# Patient Record
Sex: Female | Born: 1960 | Race: White | Hispanic: No | Marital: Married | Smoking: Former smoker
Health system: Southern US, Community
[De-identification: ages and names within clinical notes are randomized; demographics above are authoritative.]

## PROBLEM LIST (undated history)

## (undated) DIAGNOSIS — R5383 Other fatigue: Secondary | ICD-10-CM

## (undated) DIAGNOSIS — G589 Mononeuropathy, unspecified: Secondary | ICD-10-CM

## (undated) DIAGNOSIS — R112 Nausea with vomiting, unspecified: Secondary | ICD-10-CM

## (undated) DIAGNOSIS — I10 Essential (primary) hypertension: Secondary | ICD-10-CM

## (undated) DIAGNOSIS — M1990 Unspecified osteoarthritis, unspecified site: Secondary | ICD-10-CM

## (undated) DIAGNOSIS — Z9889 Other specified postprocedural states: Secondary | ICD-10-CM

## (undated) DIAGNOSIS — R0681 Apnea, not elsewhere classified: Secondary | ICD-10-CM

## (undated) DIAGNOSIS — K219 Gastro-esophageal reflux disease without esophagitis: Secondary | ICD-10-CM

## (undated) DIAGNOSIS — F329 Major depressive disorder, single episode, unspecified: Secondary | ICD-10-CM

## (undated) DIAGNOSIS — M797 Fibromyalgia: Secondary | ICD-10-CM

## (undated) DIAGNOSIS — R0683 Snoring: Secondary | ICD-10-CM

## (undated) DIAGNOSIS — K589 Irritable bowel syndrome without diarrhea: Secondary | ICD-10-CM

## (undated) DIAGNOSIS — M2550 Pain in unspecified joint: Secondary | ICD-10-CM

## (undated) DIAGNOSIS — E119 Type 2 diabetes mellitus without complications: Secondary | ICD-10-CM

## (undated) DIAGNOSIS — M791 Myalgia, unspecified site: Secondary | ICD-10-CM

## (undated) DIAGNOSIS — F32A Depression, unspecified: Secondary | ICD-10-CM

## (undated) HISTORY — DX: Type 2 diabetes mellitus without complications: E11.9

## (undated) HISTORY — PX: CARPAL TUNNEL RELEASE: SHX101

## (undated) HISTORY — PX: ARTHROSCOPIC REPAIR ACL: SUR80

## (undated) HISTORY — DX: Snoring: R06.83

## (undated) HISTORY — DX: Pain in unspecified joint: M25.50

## (undated) HISTORY — DX: Apnea, not elsewhere classified: R06.81

## (undated) HISTORY — DX: Fibromyalgia: M79.7

## (undated) HISTORY — DX: Myalgia, unspecified site: M79.10

## (undated) HISTORY — PX: CHOLECYSTECTOMY: SHX55

## (undated) HISTORY — DX: Other fatigue: R53.83

## (undated) HISTORY — PX: CLUB FOOT RELEASE: SHX1363

## (undated) HISTORY — DX: Irritable bowel syndrome, unspecified: K58.9

---

## 1999-12-09 ENCOUNTER — Ambulatory Visit (HOSPITAL_BASED_OUTPATIENT_CLINIC_OR_DEPARTMENT_OTHER): Admission: RE | Admit: 1999-12-09 | Discharge: 1999-12-09 | Payer: Self-pay | Admitting: Orthopedic Surgery

## 2001-05-24 ENCOUNTER — Encounter (HOSPITAL_COMMUNITY): Admission: RE | Admit: 2001-05-24 | Discharge: 2001-06-23 | Payer: Self-pay | Admitting: Preventative Medicine

## 2007-05-17 ENCOUNTER — Ambulatory Visit (HOSPITAL_COMMUNITY): Admission: RE | Admit: 2007-05-17 | Discharge: 2007-05-17 | Payer: Self-pay | Admitting: Obstetrics and Gynecology

## 2008-02-08 ENCOUNTER — Other Ambulatory Visit: Admission: RE | Admit: 2008-02-08 | Discharge: 2008-02-08 | Payer: Self-pay | Admitting: Obstetrics and Gynecology

## 2008-02-14 ENCOUNTER — Ambulatory Visit (HOSPITAL_COMMUNITY): Admission: RE | Admit: 2008-02-14 | Discharge: 2008-02-14 | Payer: Self-pay | Admitting: Obstetrics & Gynecology

## 2008-06-28 ENCOUNTER — Ambulatory Visit (HOSPITAL_COMMUNITY): Admission: RE | Admit: 2008-06-28 | Discharge: 2008-06-28 | Payer: Self-pay | Admitting: Obstetrics and Gynecology

## 2008-07-16 ENCOUNTER — Ambulatory Visit (HOSPITAL_COMMUNITY): Admission: RE | Admit: 2008-07-16 | Discharge: 2008-07-16 | Payer: Self-pay | Admitting: Internal Medicine

## 2009-03-07 ENCOUNTER — Other Ambulatory Visit: Admission: RE | Admit: 2009-03-07 | Discharge: 2009-03-07 | Payer: Self-pay | Admitting: Obstetrics and Gynecology

## 2009-07-02 ENCOUNTER — Ambulatory Visit (HOSPITAL_COMMUNITY): Admission: RE | Admit: 2009-07-02 | Discharge: 2009-07-02 | Payer: Self-pay | Admitting: Obstetrics and Gynecology

## 2009-10-12 ENCOUNTER — Ambulatory Visit (HOSPITAL_COMMUNITY): Admission: RE | Admit: 2009-10-12 | Discharge: 2009-10-12 | Payer: Self-pay | Admitting: Family Medicine

## 2010-09-04 ENCOUNTER — Other Ambulatory Visit
Admission: RE | Admit: 2010-09-04 | Discharge: 2010-09-04 | Payer: Self-pay | Source: Home / Self Care | Admitting: Obstetrics and Gynecology

## 2011-01-09 NOTE — Op Note (Signed)
[place]. [facility]  Patient:    Erica Price, Erica Price                         MRN: [id number] Proc. Date: 12/09/99 Attending:  Nicki Reaper, M.D. CC:         Nicki Reaper, M.D. (2)                           Operative Report  PREOPERATIVE DIAGNOSIS:  Carpal tunnel syndrome, right hand.  POSTOPERATIVE DIAGNOSIS:  Carpal tunnel syndrome, right hand.  OPERATION:  Decompression of right median nerve.  SURGEON:  Nicki Reaper, M.D.  ASSISTANT:  Joaquin Courts, R.N.  ANESTHESIA:  Forearm-based IV regional.  ANESTHESIOLOGIST:  Edwin Cap. Zoila Shutter, M.D.  INDICATIONS:  The patient is a 50 year old female with a history of carpal tunnel syndrome.  EMG and nerve conductions were positive, which has not responded to conservative treatment.  DESCRIPTION OF PROCEDURE:  The patient was brought to the operating room where  forearm-based IV regional anesthetic was carried out without difficulty.  She was prepped and draped using Betadine scrub and solution, with the ____ arm free, in the supine position.  A longitudinal incision was made in the palm and carried own through the subcutaneous tissue.  Bleeders were electrocauterized. The palmar fascia was split.  The superficial palmar arch was identified.  The flexor tendon to the ring and little finger identified to the ulnar side of the median nerve.  The carpal retinaculum was incised with sharp dissection.  A right angle and Sewall retractor were placed between skin and forearm fascia.  The fascia was released for approximately 3.0 to 5.0 cm, under direct vision.  No further lesions were identified.  The canal was explored.  The wound was irrigated.  The skin was closed with interrupted #5-0 nylon sutures.  A sterile compressive dressing and splint  were applied.  The patient tolerated the procedure well and was taken to the recovery room for  observation, in satisfactory condition.  DISPOSITION:  She is  discharged home, to return to the [facility] in one week, on Vicodin and Keflex. DD:  12/09/99 TD:  12/09/99 Job: [id number]

## 2011-01-26 ENCOUNTER — Other Ambulatory Visit: Payer: Self-pay | Admitting: Obstetrics & Gynecology

## 2011-01-26 DIAGNOSIS — Z139 Encounter for screening, unspecified: Secondary | ICD-10-CM

## 2011-02-05 ENCOUNTER — Ambulatory Visit (HOSPITAL_COMMUNITY)
Admission: RE | Admit: 2011-02-05 | Discharge: 2011-02-05 | Disposition: A | Discharge status: Home / Self Care | Payer: 59 | Source: Ambulatory Visit | Attending: Obstetrics & Gynecology | Admitting: Obstetrics & Gynecology

## 2011-02-05 DIAGNOSIS — Z139 Encounter for screening, unspecified: Secondary | ICD-10-CM

## 2011-02-05 DIAGNOSIS — Z1231 Encounter for screening mammogram for malignant neoplasm of breast: Secondary | ICD-10-CM | POA: Insufficient documentation

## 2013-03-02 ENCOUNTER — Other Ambulatory Visit: Payer: Self-pay | Admitting: Adult Health

## 2013-03-02 DIAGNOSIS — Z139 Encounter for screening, unspecified: Secondary | ICD-10-CM

## 2013-03-07 ENCOUNTER — Ambulatory Visit (HOSPITAL_COMMUNITY)
Admission: RE | Admit: 2013-03-07 | Discharge: 2013-03-07 | Disposition: A | Discharge status: Home / Self Care | Payer: BC Managed Care – PPO | Source: Ambulatory Visit | Attending: Adult Health | Admitting: Adult Health

## 2013-03-07 DIAGNOSIS — Z1231 Encounter for screening mammogram for malignant neoplasm of breast: Secondary | ICD-10-CM | POA: Insufficient documentation

## 2013-03-07 DIAGNOSIS — Z139 Encounter for screening, unspecified: Secondary | ICD-10-CM

## 2013-05-23 ENCOUNTER — Other Ambulatory Visit (HOSPITAL_COMMUNITY): Payer: Self-pay | Admitting: Nephrology

## 2013-05-23 DIAGNOSIS — N289 Disorder of kidney and ureter, unspecified: Secondary | ICD-10-CM

## 2013-05-25 ENCOUNTER — Ambulatory Visit (HOSPITAL_COMMUNITY)
Admission: RE | Admit: 2013-05-25 | Discharge: 2013-05-25 | Disposition: A | Discharge status: Home / Self Care | Payer: BC Managed Care – PPO | Source: Ambulatory Visit | Attending: Nephrology | Admitting: Nephrology

## 2013-05-25 DIAGNOSIS — N289 Disorder of kidney and ureter, unspecified: Secondary | ICD-10-CM | POA: Insufficient documentation

## 2013-05-30 ENCOUNTER — Telehealth: Payer: Self-pay

## 2013-05-30 NOTE — Telephone Encounter (Signed)
Pt was referred by Dr. Phillips Odor for screening colonoscopy. I called and she said she is having some other tests on her kidneys now and she will call when she is ready to schedule. Letter faxed to PCP.

## 2013-07-25 ENCOUNTER — Institutional Professional Consult (permissible substitution): Payer: Self-pay | Admitting: Neurology

## 2013-07-31 ENCOUNTER — Other Ambulatory Visit (HOSPITAL_COMMUNITY): Payer: Self-pay | Admitting: Rheumatology

## 2013-07-31 ENCOUNTER — Ambulatory Visit (HOSPITAL_COMMUNITY)
Admission: RE | Admit: 2013-07-31 | Discharge: 2013-07-31 | Disposition: A | Discharge status: Home / Self Care | Payer: 59 | Source: Ambulatory Visit | Attending: Rheumatology | Admitting: Rheumatology

## 2013-07-31 DIAGNOSIS — M79609 Pain in unspecified limb: Secondary | ICD-10-CM | POA: Insufficient documentation

## 2013-07-31 DIAGNOSIS — M545 Low back pain, unspecified: Secondary | ICD-10-CM | POA: Insufficient documentation

## 2013-07-31 DIAGNOSIS — M79651 Pain in right thigh: Secondary | ICD-10-CM

## 2013-07-31 DIAGNOSIS — M549 Dorsalgia, unspecified: Secondary | ICD-10-CM

## 2013-08-16 ENCOUNTER — Ambulatory Visit (INDEPENDENT_AMBULATORY_CARE_PROVIDER_SITE_OTHER): Payer: 59 | Admitting: Neurology

## 2013-08-16 ENCOUNTER — Encounter: Payer: Self-pay | Admitting: Neurology

## 2013-08-16 VITALS — BP 146/80 | HR 88 | Temp 98.3°F | Resp 16 | Ht 62.0 in | Wt 208.4 lb

## 2013-08-16 DIAGNOSIS — G9519 Other vascular myelopathies: Secondary | ICD-10-CM

## 2013-08-16 DIAGNOSIS — M48062 Spinal stenosis, lumbar region with neurogenic claudication: Secondary | ICD-10-CM

## 2013-08-16 DIAGNOSIS — IMO0001 Reserved for inherently not codable concepts without codable children: Secondary | ICD-10-CM

## 2013-08-16 DIAGNOSIS — M791 Myalgia, unspecified site: Secondary | ICD-10-CM

## 2013-08-16 LAB — CK: Total CK: 82 U/L (ref 7–177)

## 2013-08-16 NOTE — Patient Instructions (Signed)
1.  Check blood work today 2.  EMG of the left side 3.  Return to clinic in 25-month

## 2013-08-16 NOTE — Progress Notes (Signed)
[facility] Neurology Division Clinic Note - Initial Visit   Date: 08/16/2013    FLORIS Price MRN: [id number] DOB: 11-05-1960   Dear Dr Phillips Odor:  Thank you for your kind referral of Erica Price for consultation of generalized body aches. Although her history is well known to you, please allow [name] to reiterate it for the purpose of our medical record. The patient was accompanied to the clinic by daughter-in-law.   History of Present Illness: Erica Price is a 52 y.o. right-handed Caucasian female with history of myalgias and arthritis presenting for evaluation of generalized body pain and aches.    Around September 2014, she started developing weakness of her legs, worse on the right side.  She also started having joint pain of her elbows (L >R).  She is unable to lift anything heavy with her left arm, because of elbow pain.  She has not had any falls. Symptoms seem to be intermittent and worse with activity (sweeping, mopping, walking).  Rest and tramadol improves symptoms.  She also reports having muscle cramps for the past year, but this has improved after she stopped taking naprosyn.  She feels more comfortable if walking with a shopping cart.  She complains of blurred vision but attritutes this to cataracts. She has very rare spells of difficulty swallowing solids.  Denies double vision, changes in voice, easy fatiguability.  She reports feeling very stiff in the morning and it takes a few hours for her to get "limber".  Around 8pm, her stiffness returns.  She saw Dr. Kellie Simmering, Rheumatology, who performed plain films and was told she has moderate arthritis L5-S1.    Out-side paper records, electronic medical record, and images have been reviewed where available and summarized as:  Lumbar XR 07/31/2013:  Facet overgrowth on the right L5-S1. No significant disc degeneration. No acute bony abnormality.  Labs:  ANA, ESR 6, CCP, RF - neg   Past Medical History  Diagnosis  Date  . Fatigue   . Myalgia   . Arthralgia   . Snoring   . Apnea     Poss OSA    Past Surgical History  Procedure Laterality Date  . Club foot release Left   . Arthroscopic repair acl    . Cholecystectomy    . Carpal tunnel release Right      Medications:  Current Outpatient Prescriptions on File Prior to Visit  Medication Sig Dispense Refill  . Calcium Carbonate-Vitamin D (CALCIUM-VITAMIN D) 500-200 MG-UNIT per tablet Take 1 tablet by mouth daily.      . furosemide (LASIX) 20 MG tablet Take 20 mg by mouth.      . traMADol-acetaminophen (ULTRACET) 37.5-325 MG per tablet Take 1 tablet by mouth every 6 (six) hours as needed.       No current facility-administered medications on file prior to visit.    Allergies: No Known Allergies  Family History: Family History  Problem Relation Age of Onset  . Heart failure Father     Died, 13  . Hypertension Mother     Living, 29  . Breast cancer Mother   . Kidney cancer Mother   . Hypercholesterolemia Son     Social History: History   Social History  . Marital Status: Married    Spouse Name: N/A    Number of Children: N/A  . Years of Education: N/A   Occupational History  . Not on file.   Social History Main Topics  . Smoking status: Former Smoker  Quit date: 10/18/2011  . Smokeless tobacco: Not on file  . Alcohol Use: .5 - 1.5 oz/week    1-3 drink(s) per week     Comment: Rare, only holidays  . Drug Use: No  . Sexual Activity: Not on file   Other Topics Concern  . Not on file   Social History Narrative   She is currently unemployed.  She was terminated on 06/15/2013.  She was previously a Architect, worked at [employer], and worked in Designer, fashion/clothing for 25 years.   She lives with husband.  They have two children.    Review of Systems:  CONSTITUTIONAL: No fevers, chills, night sweats, or weight loss.   EYES: No visual changes or eye pain ENT: No hearing changes.  No history of nose bleeds.   RESPIRATORY:  No cough, wheezing and shortness of breath.   CARDIOVASCULAR: Negative for chest pain, and palpitations.   GI: Negative for abdominal discomfort, blood in stools or black stools.  No recent change in bowel habits.   GU:  No history of incontinence.   MUSCLOSKELETAL: No history of joint pain or swelling.  +myalgias.   SKIN: Negative for lesions, rash, and itching.   HEMATOLOGY/ONCOLOGY: Negative for prolonged bleeding, bruising easily, and swollen nodes.   ENDOCRINE: Negative for cold or heat intolerance, polydipsia or goiter.   PSYCH:  +depression or anxiety symptoms.   NEURO: As Above.   Vital Signs:  BP 146/80  Pulse 88  Temp(Src) 98.3 F (36.8 C)  Resp 16  Ht 5\' 2"  (1.575 m)  Wt 208 lb 6.4 oz (94.53 kg)  BMI 38.11 kg/m2   General Medical Exam:   General:  Obese, well appearing, comfortable.   Eyes/ENT: see cranial nerve examination.   Neck: No masses appreciated.  Full range of motion without tenderness.  No carotid bruits. Respiratory:  Clear to auscultation, good air entry bilaterally.   Cardiac:  Regular rate and rhythm, no murmur.   Back:  No pain to palpation of spinous processes.   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 Skin:  Skin color, texture, turgor normal. No rashes or lesions. Positive for fibromyalgia tenderpoints at 14/18 areas  Neurological Exam: MENTAL STATUS including orientation to time, place, person, recent and remote memory, attention span and concentration, language, and fund of knowledge is normal.  Speech is not dysarthric.  CRANIAL NERVES: II:  No visual field defects.  Unremarkable fundi.   III-IV-VI: Pupils equal round and reactive to light.  Normal conjugate, extra-ocular eye movements in all directions of gaze.  No nystagmus.  No ptosis with sustained upward gaze.   V:  Normal facial sensation.   VII:  Normal facial symmetry and movements. VIII:  Normal hearing and vestibular function.   IX-X:  Normal  palatal movement.   XI:  Normal shoulder shrug and head rotation.   XII:  Normal tongue strength and range of motion, no deviation or fasciculation.  MOTOR:  No atrophy, fasciculations or abnormal movements.  No pronator drift.  Tone is normal.  No fatigability on exam.  Right Upper Extremity:    Left Upper Extremity:    Deltoid  5/5   Deltoid  5/5   Biceps  5/5   Biceps  5/5   Triceps  5/5   Triceps  5/5   Wrist extensors  5/5   Wrist extensors  5/5   Wrist flexors  5/5   Wrist flexors  5/5   Finger extensors  5/5   Finger extensors  5/5   Finger flexors  5/5   Finger flexors  5/5   Dorsal interossei  5/5   Dorsal interossei  5/5   Abductor pollicis  5/5   Abductor pollicis  5/5   Tone (Ashworth scale)  0  Tone (Ashworth scale)  0   Right Lower Extremity:    Left Lower Extremity:    Hip flexors  5/5   Hip flexors  5/5   Hip extensors  5/5   Hip extensors  5/5   Knee flexors  5/5   Knee flexors  5/5   Knee extensors  5/5   Knee extensors  5/5   Dorsiflexors  5/5   Dorsiflexors  5/5   Plantarflexors  5/5   Plantarflexors  5/5   Toe extensors  5/5   Toe extensors  5/5   Toe flexors  5/5   Toe flexors  5/5   Tone (Ashworth scale)  0  Tone (Ashworth scale)  0   MSRs:  Right                                                                 Left brachioradialis 2+  brachioradialis 2+  biceps 2+  biceps 2+  triceps 2+  triceps 2+  patellar 2+  patellar 2+  ankle jerk 2+  ankle jerk 2+  Hoffman no  Hoffman no  plantar response down  plantar response down   SENSORY:  Normal and symmetric perception of light touch, pinprick, vibration, and proprioception.  Romberg's sign absent.   COORDINATION/GAIT: Normal finger-to- nose-finger and heel-to-shin.  Intact rapid alternating movements bilaterally.  Able to rise from a chair without using arms.  Gait narrow based and stable. Tandem and stressed gait intact.     IMPRESSION: Ms. Kaser is a 52 year-old female presenting for evaluation  of mylagias and arthralgias.  Neurological examination is non-focal.  Motor strength and bulk is preserved.  Normal reflexes.  She does have tenderness to 14/18 fibromylagia tenderpoints. I think that she may have an overlay of symptoms related to arthritis, possible fibromyalgia, and neurogenic claudication.  I will complete work-up as noted below to help differentiate these and see which is most problematic.Her leg symptoms are suggestive of neurogenic claudication, so will obtain EMG of the legs. My suspicion for myopathy or neuromuscular junction disorder is low based on her history or exam.    PLAN/RECOMMENDATIONS:  1.  Check CK, aldolase, vitamin B12, SPEP with IFE 2.  EMG of the left side 3.  Return to clinic in 7-month   The duration of this appointment visit was 60 minutes of face-to-face time with the patient.  Greater than 50% of this time was spent in counseling, explanation of diagnosis, planning of further management, and coordination of care.   Thank you for allowing me to participate in patient's care.  If I can answer any additional questions, I would be pleased to do so.    Sincerely,    Donika K. Allena Katz, DO

## 2013-08-19 LAB — ALDOLASE: Aldolase: 6.2 U/L (ref <=8.1)

## 2013-08-21 LAB — PROTEIN ELECTROPHORESIS, SERUM
Beta Globulin: 7.7 % — ABNORMAL HIGH (ref 4.7–7.2)
Gamma Globulin: 11.5 % (ref 11.1–18.8)

## 2013-09-18 ENCOUNTER — Ambulatory Visit (INDEPENDENT_AMBULATORY_CARE_PROVIDER_SITE_OTHER): Payer: 59 | Admitting: Neurology

## 2013-09-18 ENCOUNTER — Encounter: Payer: Self-pay | Admitting: Neurology

## 2013-09-18 DIAGNOSIS — M5417 Radiculopathy, lumbosacral region: Secondary | ICD-10-CM

## 2013-09-18 DIAGNOSIS — IMO0002 Reserved for concepts with insufficient information to code with codable children: Secondary | ICD-10-CM | POA: Diagnosis not present

## 2013-09-18 NOTE — Progress Notes (Signed)
See procedure note for EMG results.  Donika K. Patel, DO  

## 2013-09-18 NOTE — Procedures (Signed)
[facility] Neurology  [place], [address]  [place], [place] [address] Tel: [phone] Fax:  [phone] Test Date:  09/18/2013  Patient: Erica Price DOB: 03-30-1961 Physician: Narda Amber, DO  Sex: Female Height: 5\' 2"  Ref Phys: Narda Amber  ID#: [id number] Temp: 34.2C Technician:    Patient Complaints: This is a 53 year-old female presenting for evaluation of bilateral leg pain and weakness.  NCV & EMG Findings: Extensive evaluation of the right lower extremity and additional studies of the left reveals:  1. Normal sural and superficial peroneal sensory responses.  2. Normal tibial and peroneal motor responses. 3. H-reflex is normal bilaterally.  4. Needle electrode examination shows chronic motor axonal loss changes affecting the tibialis anterior and gluteus medius muscles bilaterally. There is no active denervation.   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 Overall, these findings are mild in degree electrically.  There is no evidence of a sensorimotor polyneuropathy affecting the right lower extremity.   ___________________________ Narda Amber, DO    Nerve Conduction Studies Anti Sensory Summary Table   Site NR Peak (ms) Norm Peak (ms) P-T Amp (V) Norm P-T Amp  Right Sup Peroneal Anti Sensory (Ant Lat Mall)  12 cm    2.4 <4.6 14.1 >4  Right Sural Anti Sensory (Lat Mall)  Calf    2.5 <4.6 19.1 >4   Motor Summary Table   Site NR Onset (ms) Norm Onset (ms) O-P Amp (mV) Norm O-P Amp Site1 Site2 Delta-0 (ms) Dist (cm) Vel (m/s) Norm Vel (m/s)  Right Peroneal Motor (Ext Dig Brev)  Ankle    3.3 <6.0 3.8 >2.5 B Fib Ankle 5.9 31.0 53 >40  B Fib    9.2  3.7  Poplt B Fib 2.0 10.0 50 >40  Poplt    11.2  3.5         Right Tibial Motor (Abd Hall Brev)  Ankle    3.8 <6.0 4.0 >4 Knee Ankle 7.8 41.0 53 >40  Knee    11.6  2.1          H Reflex Studies   NR H-Lat (ms) Lat Norm  (ms) L-R H-Lat (ms)  Left Tibial (Gastroc)     30.67 <35 0.89  Right Tibial (Gastroc)     31.56 <35 0.89   EMG   Side Muscle Ins Act Fibs Psw Fasc Number Recrt Dur Dur. Amp Amp. Poly Poly. Comment  Right GluteusMed Nml Nml Nml Nml 1- Mod-R Few 1+ Nml Nml Few 1- N/A  Right AntTibialis Nml Nml Nml Nml 1- Mod-R Some 1+ Nml Nml Some 1+ N/A  Right Flex Dig Long Nml Nml Nml Nml Nml Nml Nml Nml Nml Nml Nml Nml N/A  Right RectFemoris Nml Nml Nml Nml Nml Nml Nml Nml Nml Nml Nml Nml N/A  Right BicepsFemS Nml Nml Nml Nml Nml Nml Nml Nml Nml Nml Nml Nml N/A  Right Gastroc Nml Nml Nml Nml Nml Nml Nml Nml Nml Nml Nml Nml N/A  Left AntTibialis Nml Nml Nml Nml 1- Mod-R Some 1+ Nml Nml Few 1- N/A  Left GluteusMed Nml Nml Nml Nml 1- Mod-R Few 1+ Nml Nml Few 1- N/A  Left Gastroc Nml Nml Nml Nml Nml Nml Nml Nml Nml Nml Nml Nml N/A      Waveforms:

## 2013-09-18 NOTE — Procedures (Signed)
[facility] Neurology  [place], [place]  [place],  [address] Tel: [phone] Fax:  [phone] Test Date:  09/18/2013  Patient: Erica Price DOB: 02/10/1936 Physician: Narda Amber, DO  Sex: Female Height: 5\' 2"  Ref Phys: Metta Clines  ID#: [id number] Temp: 32.1C Technician:    Patient Complaints: This is a 53 year-old female presenting for evaluation of dysarthria and bilateral foot drop.  NCV & EMG Findings: Extensive evaluation of the right upper extremity, right lower extremity, mid-thoracic paraspinal levels (T7 and T11 level) and bulbar muscles reveals:  1. Normal median and ulnar sensory responses. The sural and superficial peroneal sensory responses are absent.   2. The tibial motor response is markedly reduced. The peroneal motor response recorded at extensor digitorum brevis and tibialis anterior is essentially absent. 3. Normal median and ulnar motor responses. 4. Chronic motor axonal loss changes affecting nearly all the tested muscles of the lower extremity with active changes in the tibialis anterior, gastrocnemius, and vastus lateralis.  No motor units were recruited in the flexor digitorum longus. 5. In the upper extremity, there is chronic motor axonal loss changes affecting the C6-T1 myotomes, with sparse active changes isolated to the first dorsal interosseous muscle. 6. There is no active motor axon loss involving the thoracic paraspinal muscles. 7. Chronic motor axon loss changes are seen in the hypoglossal and mentalis muscles without evidence of active denervation.   Impression: 1. Active on chronic generalized large fiber sensorimotor polyneuropathy, axon loss in type, affecting the right side conforming to a gradient pattern. Overall, these changes are moderately severe in degree electrically.  2. Alternatively, multilevel intraspinal canal lesions (i.e. radiculopathy, anterior horn cell disorder) affecting C5-T1 and L2-S1 nerve roots/segments  cannot be excluded and repeat EMG in 3-6 months may be indicated to evaluate to degree of ongoing axon motor loss.    ___________________________ Narda Amber, DO    Nerve Conduction Studies Anti Sensory Summary Table   Site NR Peak (ms) Norm Peak (ms) P-T Amp (V) Norm P-T Amp  Right Median Anti Sensory (2nd Digit)  Wrist    3.3 <3.8 16.0 >10  Right Sup Peroneal Anti Sensory (Ant Lat Mall)  12 cm NR  <4.6  >3  Right Sural Anti Sensory (Lat Mall)  Calf NR  <4.6  >3  Right Ulnar Anti Sensory (5th Digit)  Wrist    3.2 <3.2 15.4 >5   Motor Summary Table   Site NR Onset (ms) Norm Onset (ms) O-P Amp (mV) Norm O-P Amp Site1 Site2 Delta-0 (ms) Dist (cm) Vel (m/s) Norm Vel (m/s)  Right Median Motor (Abd Poll Brev)  Wrist    3.4 <4.0 7.4 >5 Elbow Wrist 5.1 27.0 53 >50  Elbow    8.5  6.4         Right Peroneal Motor (Ext Dig Brev)  Ankle NR  <6.0  >2.5 B Fib Ankle  33.0  >40  B Fib NR     Poplt B Fib  10.0  >40  Poplt NR            Right Peroneal TA Motor (Tib Ant)  Fib Head    6.3 <4.5 0.6 >3 Poplit Fib Head 18.5 10.0 5 >40  Poplit    24.8  0.6         Post-exercise    3.3  0.2         Right Tibial Motor (Abd Hall Brev)  Ankle    11.4 <6.0 0.8 >4 Knee  Ankle  41.0  >40  Knee NR            Right Ulnar Motor (Abd Dig Minimi)  Wrist    2.4 <3.1 7.9 >7 B Elbow Wrist 3.7 21.0 57 >50  B Elbow    6.1  7.3  A Elbow B Elbow 1.9 10.0 53 >50  A Elbow    8.0  7.2          EMG   Side Muscle Ins Act Fibs Psw Fasc Number Recrt Dur Dur. Amp Amp. Poly Poly. Comment  Right Gastroc Nml 1+ Nml Nml 3- Rapid All 1+ All 1+ Nml Nml N/A  Right AntTibialis Nml 2+ Nml Nml SMU Rapid All 1+ All 1+ Nml Nml N/A  Right Flex Dig Long Nml Nml Nml Nml NE - - - - - - - N/A  Right VastusLat Nml 1+ Nml Nml 3- Rapid Many 1+ Most 1+ Few 1+ N/A  Right 1stDorInt Nml Nml 1+ Nml 1- Rapid Few 1+ Few 1+ Few 1+ N/A  Right Ext Indicis Nml Nml Nml Nml Nml Nml Nml Nml Nml Nml Nml Nml N/A  Right FlexPolLong Nml Nml Nml Nml  1- Mod-R Few 1+ Few 1+ Nml Nml N/A  Right ABD Dig Min Nml Nml Nml Nml 1- Mod-R Few 1+ Nml Nml Nml Nml N/A  Right Abd Poll Brev Nml Nml Nml Nml 2- Rapid Some 1+ Some 1+ Nml Nml N/A  Right PronatorTeres Nml Nml Nml Nml 1- Mod-R Few 1+ Nml Nml Nml Nml N/A  Right Biceps Nml Nml Nml 1+ 1- Mod-R Few 1- Nml Nml Few 1+ N/A  Right Triceps Nml Nml Nml Nml 1- Mod-R Some Nml Nml Nml Nml Nml N/A  Right Deltoid Nml Nml Nml Nml 1- Rapid Some 1+ Some 1+ Nml Nml N/A  Right Cervical Parasp Low Nml Nml Nml Nml Nml Nml Nml Nml Nml Nml Nml Nml N/A  Right T7 Parasp Nml Nml Nml Nml Nml Nml Nml Nml Nml Nml Nml Nml N/A  Right GluteusMed Nml Nml Nml Nml 1- Mod-R Some 1+ Nml Nml Nml Nml N/A  Right BicepsFemS Nml Nml Nml Nml 1- Mod-R Some 1+ Some 1+ Nml Nml N/A  Right Lumbo Parasp Low Nml Nml Nml Nml Nml Nml Nml Nml Nml Nml Nml Nml N/A  Right T11 Parasp Nml Nml Nml Nml Nml Nml Nml Nml Nml Nml Nml Nml N/A  Right Mentalis Nml Nml Nml Nml 1- Mod-R Few 1+ Nml Nml Nml Nml N/A  Right Hypoglossal Nml Nml Nml Nml 1- Mod-R Few 1+ Nml Nml Nml Nml N/A      Waveforms:

## 2014-04-04 ENCOUNTER — Other Ambulatory Visit: Payer: Self-pay | Admitting: Adult Health

## 2014-04-04 DIAGNOSIS — Z139 Encounter for screening, unspecified: Secondary | ICD-10-CM

## 2014-04-09 ENCOUNTER — Ambulatory Visit (HOSPITAL_COMMUNITY)
Admission: RE | Admit: 2014-04-09 | Discharge: 2014-04-09 | Disposition: A | Discharge status: Home / Self Care | Payer: 59 | Source: Ambulatory Visit | Attending: Adult Health | Admitting: Adult Health

## 2014-04-09 DIAGNOSIS — Z1231 Encounter for screening mammogram for malignant neoplasm of breast: Secondary | ICD-10-CM | POA: Diagnosis not present

## 2014-04-09 DIAGNOSIS — Z139 Encounter for screening, unspecified: Secondary | ICD-10-CM

## 2014-04-25 ENCOUNTER — Telehealth: Payer: Self-pay

## 2014-04-25 NOTE — Telephone Encounter (Signed)
Pt called to schedule her first colonoscopy. She can be reached at [phone]

## 2014-05-02 NOTE — Telephone Encounter (Signed)
I called pt. She is having constipation now. She has intermittent diarrhea and constipation. I explained to her that we really need to make sure she is NOT having any constipation issues prior to colonoscopy.  She is scheduled OV with Laban Emperor, NP on 06/01/2014 at 8:30 AM.  She will get Dr. Hilma Favors to fax new referral since her referral was sent 04/2013.

## 2014-06-01 ENCOUNTER — Encounter: Payer: Self-pay | Admitting: Gastroenterology

## 2014-06-01 ENCOUNTER — Ambulatory Visit (INDEPENDENT_AMBULATORY_CARE_PROVIDER_SITE_OTHER): Payer: 59 | Admitting: Gastroenterology

## 2014-06-01 ENCOUNTER — Other Ambulatory Visit: Payer: Self-pay

## 2014-06-01 ENCOUNTER — Encounter (HOSPITAL_COMMUNITY): Payer: Self-pay | Admitting: Pharmacy Technician

## 2014-06-01 VITALS — BP 126/78 | HR 74 | Temp 97.7°F | Ht 62.0 in | Wt 218.2 lb

## 2014-06-01 DIAGNOSIS — Z1211 Encounter for screening for malignant neoplasm of colon: Secondary | ICD-10-CM

## 2014-06-01 DIAGNOSIS — K219 Gastro-esophageal reflux disease without esophagitis: Secondary | ICD-10-CM

## 2014-06-01 MED ORDER — PANTOPRAZOLE SODIUM 40 MG PO TBEC: 40.0000 mg | DELAYED_RELEASE_TABLET | Freq: Every day | ORAL | Status: DC

## 2014-06-01 MED ORDER — PEG-KCL-NACL-NASULF-NA ASC-C 100 G PO SOLR: 1.0000 | ORAL | Status: DC

## 2014-06-01 MED ORDER — DICYCLOMINE HCL 10 MG PO CAPS: 10.0000 mg | ORAL_CAPSULE | Freq: Three times a day (TID) | ORAL | Status: DC

## 2014-06-01 NOTE — Progress Notes (Signed)
Primary Care Physician:  Purvis Kilts, MD Primary Gastroenterologist:  Dr. Oneida Alar   Chief Complaint  Patient presents with  . Colonoscopy    HPI:   Erica Price presents today for a screening colonoscopy. Intermittent constipation, diarrhea. Feel like it's about equal. Symptoms for about 2 years. Previously would have just sporadic diarrhea. No relation of diarrhea to certain foods. No rectal bleeding. Abdominal cramping with diarrhea. Since starting Tramadol has noticed constipation. Episodes will last a day to 2 days. Eats cheese but not a lot of other dairy. Zantac 75 mg po BID, now chewing antacids along with it due to bloating. Decreased heartburn. Sometimes feels like "hard to swallow". Occasionally, not every time she eats.   Past Medical History  Diagnosis Date  . Fatigue   . Myalgia   . Arthralgia   . Snoring   . Apnea     Poss OSA  . Fibromyalgia     Past Surgical History  Procedure Laterality Date  . Club foot release Left   . Arthroscopic repair acl    . Cholecystectomy  1990s  . Carpal tunnel release Right     Current Outpatient Prescriptions  Medication Sig Dispense Refill  . Cinnamon 500 MG capsule Take 500 mg by mouth daily.      . DULoxetine (CYMBALTA) 60 MG capsule Take 60 mg by mouth daily.      . furosemide (LASIX) 20 MG tablet Take 20 mg by mouth.      . gabapentin (NEURONTIN) 100 MG capsule Take 100 mg by mouth 2 (two) times daily.       Marland Kitchen Ketorolac Tromethamine (TORADOL IJ) Inject as directed every 6 (six) weeks.      . ranitidine (ZANTAC) 75 MG tablet Take 75 mg by mouth 2 (two) times daily.      . simethicone (MYLICON) 80 MG chewable tablet Chew 80 mg by mouth once.      . traMADol (ULTRAM) 50 MG tablet Take 50 mg by mouth every 6 (six) hours as needed.       . zolpidem (AMBIEN) 10 MG tablet Take 10 mg by mouth at bedtime.        No current facility-administered medications for this visit.    Allergies as of 06/01/2014  . (No  Known Allergies)    Family History  Problem Relation Age of Onset  . Heart failure Father     Died, 78  . Hypertension Mother     Living, 73  . Breast cancer Mother   . Kidney cancer Mother   . Hypercholesterolemia Son   . Colon cancer Neg Hx     History   Social History  . Marital Status: Married    Spouse Name: N/A    Number of Children: N/A  . Years of Education: N/A   Occupational History  . Not on file.   Social History Main Topics  . Smoking status: Former Smoker    Quit date: 10/18/2011  . Smokeless tobacco: Not on file  . Alcohol Use: 0.5 - 1.5 oz/week    1-3 drink(s) per week     Comment: Rare, only holidays  . Drug Use: No  . Sexual Activity: Not on file   Other Topics Concern  . Not on file   Social History Narrative   She is currently unemployed.  She was terminated on 06/15/2013.  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   She lives with husband.  They have two children.    Review of Systems: Gen: Denies any fever, chills, fatigue, weight loss, lack of appetite.  CV: Denies chest pain, heart palpitations, peripheral edema, syncope.  Resp: +DOE GI: see HPI GU : Denies urinary burning, urinary frequency, urinary hesitancy MS: +fibromyalgia Derm: Denies rash, itching, dry skin Psych: Denies depression, anxiety, memory loss, and confusion Heme: Denies bruising, bleeding, and enlarged lymph nodes.  Physical Exam: BP 126/78  Pulse 74  Temp(Src) 97.7 F (36.5 C) (Oral)  Ht 5\' 2"  (1.575 m)  Wt 218 lb 3.2 oz (98.975 kg)  BMI 39.90 kg/m2 General:   Alert and oriented. Pleasant and cooperative. Well-nourished and well-developed.  Head:  Normocephalic and atraumatic. Eyes:  Without icterus, sclera clear and conjunctiva pink.  Ears:  Normal auditory acuity. Nose:  No deformity, discharge,  or lesions. Mouth:  No deformity or lesions, oral mucosa pink.  Lungs:  Clear to auscultation bilaterally.  No wheezes, rales, or rhonchi. No distress.  Heart:  S1, S2 present without murmurs appreciated.  Abdomen:  +BS, soft, non-tender and non-distended. No HSM noted. No guarding or rebound. No masses appreciated.  Rectal:  Deferred  Msk:  Symmetrical without gross deformities. Normal posture. Extremities:  Without clubbing or edema. Neurologic:  Alert and  oriented x4;  grossly normal neurologically. Skin:  Intact without significant lesions or rashes. Psych:  Alert and cooperative. Normal mood and affect.

## 2014-06-01 NOTE — Patient Instructions (Addendum)
On days you have constipation, you may take Miralax each evening as needed.  On days of loose stool, you may take Bentyl 1 capsule with meals up to 4 times that day. Monitor for constipation, dry mouth, dizziness.   Review the high fiber diet attached.   We are setting you up for a colonoscopy with a possible upper endoscopy and dilation if no improvement with Protonix.   High-Fiber Diet Fiber is found in fruits, vegetables, and grains. A high-fiber diet encourages the addition of more whole grains, legumes, fruits, and vegetables in your diet. The recommended amount of fiber for adult males is 38 g per day. For adult females, it is 25 g per day. Pregnant and lactating women should get 28 g of fiber per day. If you have a digestive or bowel problem, ask your caregiver for advice before adding high-fiber foods to your diet. Eat a variety of high-fiber foods instead of only a select few type of foods.  PURPOSE  To increase stool bulk.  To make bowel movements more regular to prevent constipation.  To lower cholesterol.  To prevent overeating. WHEN IS THIS DIET USED?  It may be used if you have constipation and hemorrhoids.  It may be used if you have uncomplicated diverticulosis (intestine condition) and irritable bowel syndrome.  It may be used if you need help with weight management.  It may be used if you want to add it to your diet as a protective measure against atherosclerosis, diabetes, and cancer. SOURCES OF FIBER  Whole-grain breads and cereals.  Fruits, such as apples, oranges, bananas, berries, prunes, and pears.  Vegetables, such as green peas, carrots, sweet potatoes, beets, broccoli, cabbage, spinach, and artichokes.  Legumes, such split peas, soy, lentils.  Almonds. FIBER CONTENT IN FOODS Starches and Grains / Dietary Fiber (g)  Cheerios, 1 cup / 3 g  Corn Flakes cereal, 1 cup / 0.7 g  Rice crispy treat cereal, 1 cup / 0.3 g  Instant oatmeal (cooked),   cup / 2 g  Frosted wheat cereal, 1 cup / 5.1 g  Brown, long-grain rice (cooked), 1 cup / 3.5 g  White, long-grain rice (cooked), 1 cup / 0.6 g  Enriched macaroni (cooked), 1 cup / 2.5 g Legumes / Dietary Fiber (g)  Baked beans (canned, plain, or vegetarian),  cup / 5.2 g  Kidney beans (canned),  cup / 6.8 g  Pinto beans (cooked),  cup / 5.5 g Breads and Crackers / Dietary Fiber (g)  Plain or honey graham crackers, 2 squares / 0.7 g  Saltine crackers, 3 squares / 0.3 g  Plain, salted pretzels, 10 pieces / 1.8 g  Whole-wheat bread, 1 slice / 1.9 g  White bread, 1 slice / 0.7 g  Raisin bread, 1 slice / 1.2 g  Plain bagel, 3 oz / 2 g  Flour tortilla, 1 oz / 0.9 g  Corn tortilla, 1 small / 1.5 g  Hamburger or hotdog bun, 1 small / 0.9 g Fruits / Dietary Fiber (g)  Apple with skin, 1 medium / 4.4 g  Sweetened applesauce,  cup / 1.5 g  Banana,  medium / 1.5 g  Grapes, 10 grapes / 0.4 g  Orange, 1 small / 2.3 g  Raisin, 1.5 oz / 1.6 g  Melon, 1 cup / 1.4 g Vegetables / Dietary Fiber (g)  Green beans (canned),  cup / 1.3 g  Carrots (cooked),  cup / 2.3 g  Broccoli (cooked),  cup /  2.8 g  Peas (cooked),  cup / 4.4 g  Mashed potatoes,  cup / 1.6 g  Lettuce, 1 cup / 0.5 g  Corn (canned),  cup / 1.6 g  Tomato,  cup / 1.1 g Document Released: 08/10/2005 Document Revised: 02/09/2012 Document Reviewed: 11/12/2011 [facility] Patient Information 2015 [place], [place]. This information is not intended to replace advice given to you by your health care provider. Make sure you discuss any questions you have with your health care provider.

## 2014-06-04 NOTE — Assessment & Plan Note (Signed)
53 year old female with need for initial screening. Chronic history of intermittent loose stools and constipation, likely IBS. No rectal bleeding.   Proceed with colonoscopy with Dr. Oneida Alar in the near future. The risks, benefits, and alternatives have been discussed in detail with the patient. They state understanding and desire to proceed.  Miralax prn constipation Bentyl for diarrhea High fiber diet

## 2014-06-04 NOTE — Assessment & Plan Note (Signed)
Zantac BID along with Tums. No PPI currently. Occasional dysphagia. Start Protonix now. If no improvement, needs EGD with dilation at time of colonoscopy. Discussed possibility of needing upper endoscopy at time of colonoscopy if no improvement. States understanding.

## 2014-06-05 NOTE — Progress Notes (Signed)
cc'ed to pcp °

## 2014-06-06 NOTE — Patient Instructions (Signed)
LEMYA GREENWELL  06/06/2014   Your procedure is scheduled on:  06/12/2014  Report to [facility] at  48  AM.  Call this number if you have problems the morning of surgery: [phone]   Remember:   Do not eat food or drink liquids after midnight.   Take these medicines the morning of surgery with A SIP OF WATER:  Cymbalta, gabapentin, protonix, ultram   Do not wear jewelry, make-up or nail polish.  Do not wear lotions, powders, or perfumes.   Do not shave 48 hours prior to surgery. Men may shave face and neck.  Do not bring valuables to the hospital.  [facility] is not responsible for any belongings or valuables.               Contacts, dentures or bridgework may not be worn into surgery.  Leave suitcase in the car. After surgery it may be brought to your room.  For patients admitted to the hospital, discharge time is determined by your treatment team.               Patients discharged the day of surgery will not be allowed to drive home.  Name and phone number of your driver: family  Special Instructions: Shower using CHG 2 nights before surgery and the night before surgery.  If you shower the day of surgery use CHG.  Use special wash - you have one bottle of CHG for all showers.  You should use approximately 1/3 of the bottle for each shower.   Please read over the following fact sheets that you were given: Pain Booklet, Coughing and Deep Breathing, Surgical Site Infection Prevention, Anesthesia Post-op Instructions and Care and Recovery After Surgery Esophagogastroduodenoscopy Esophagogastroduodenoscopy (EGD) is a procedure to examine the lining of the esophagus, stomach, and first part of the small intestine (duodenum). A long, flexible, lighted tube with a camera attached (endoscope) is inserted down the throat to view these organs. This procedure is done to detect problems or abnormalities, such as inflammation, bleeding, ulcers, or growths, in order to treat them. The  procedure lasts about 5-20 minutes. It is usually an outpatient procedure, but it may need to be performed in emergency cases in the hospital. LET YOUR CAREGIVER KNOW ABOUT:   Allergies to food or medicine.  All medicines you are taking, including vitamins, herbs, eyedrops, and over-the-counter medicines and creams.  Use of steroids (by mouth or creams).  Previous problems you or members of your family have had with the use of anesthetics.  Any blood disorders you have.  Previous surgeries you have had.  Other health problems you have.  Possibility of pregnancy, if this applies. RISKS AND COMPLICATIONS  Generally, EGD is a safe procedure. However, as with any procedure, complications can occur. Possible complications include:  Infection.  Bleeding.  Tearing (perforation) of the esophagus, stomach, or duodenum.  Difficulty breathing or not being able to breath.  Excessive sweating.  Spasms of the larynx.  Slowed heartbeat.  Low blood pressure. BEFORE THE PROCEDURE  Do not eat or drink anything for 6-8 hours before the procedure or as directed by your caregiver.  Ask your caregiver about changing or stopping your regular medicines.  If you wear dentures, be prepared to remove them before the procedure.  Arrange for someone to drive you home after the procedure. PROCEDURE   A vein will be accessed to give medicines and fluids. A medicine to relax you (sedative)  and a pain reliever will be given through that access into the vein.  A numbing medicine (local anesthetic) may be sprayed on your throat for comfort and to stop you from gagging or coughing.  A mouth guard may be placed in your mouth to protect your teeth and to keep you from biting on the endoscope.  You will be asked to lie on your left side.  The endoscope is inserted down your throat and into the esophagus, stomach, and duodenum.  Air is put through the endoscope to allow your caregiver to view the  lining of your esophagus clearly.  The esophagus, stomach, and duodenum is then examined. During the exam, your caregiver may:  Remove tissue to be examined under a microscope (biopsy) for inflammation, infection, or other medical problems.  Remove growths.  Remove objects (foreign bodies) that are stuck.  Treat any bleeding with medicines or other devices that stop tissues from bleeding (hot cautery, clipping devices).  Widen (dilate) or stretch narrowed areas of the esophagus and stomach.  The endoscope will then be withdrawn. AFTER THE PROCEDURE  You will be taken to a recovery area to be monitored. You will be able to go home once you are stable and alert.  Do not eat or drink anything until the local anesthetic and numbing medicines have worn off. You may choke.  It is normal to feel bloated, have pain with swallowing, or have a sore throat for a short time. This will wear off.  Your caregiver should be able to discuss his or her findings with you. It will take longer to discuss the test results if any biopsies were taken. Document Released: 12/11/2004 Document Revised: 12/25/2013 Document Reviewed: 07/13/2012 [facility] Patient Information 2015 [place], [place]. This information is not intended to replace advice given to you by your health care provider. Make sure you discuss any questions you have with your health care provider. Colonoscopy A colonoscopy is an exam to look at the entire large intestine (colon). This exam can help find problems such as tumors, polyps, inflammation, and areas of bleeding. The exam takes about 1 hour.  LET [facility] CARE PROVIDER KNOW ABOUT:   Any allergies you have.  All medicines you are taking, including vitamins, herbs, eye drops, creams, and over-the-counter medicines.  Previous problems you or members of your family have had with the use of anesthetics.  Any blood disorders you have.  Previous surgeries you have had.  Medical  conditions you have. RISKS AND COMPLICATIONS  Generally, this is a safe procedure. However, as with any procedure, complications can occur. Possible complications include:  Bleeding.  Tearing or rupture of the colon wall.  Reaction to medicines given during the exam.  Infection (rare). BEFORE THE PROCEDURE   Ask your health care provider about changing or stopping your regular medicines.  You may be prescribed an oral bowel prep. This involves drinking a large amount of medicated liquid, starting the day before your procedure. The liquid will cause you to have multiple loose stools until your stool is almost clear or light green. This cleans out your colon in preparation for the procedure.  Do not eat or drink anything else once you have started the bowel prep, unless your health care provider tells you it is safe to do so.  Arrange for someone to drive you home after the procedure. PROCEDURE   You will be given medicine to help you relax (sedative).  You will lie on your side with your knees  bent.  A long, flexible tube with a light and camera on the end (colonoscope) will be inserted through the rectum and into the colon. The camera sends video back to a computer screen as it moves through the colon. The colonoscope also releases carbon dioxide gas to inflate the colon. This helps your health care provider see the area better.  During the exam, your health care provider may take a small tissue sample (biopsy) to be examined under a microscope if any abnormalities are found.  The exam is finished when the entire colon has been viewed. AFTER THE PROCEDURE   Do not drive for 24 hours after the exam.  You may have a small amount of blood in your stool.  You may pass moderate amounts of gas and have mild abdominal cramping or bloating. This is caused by the gas used to inflate your colon during the exam.  Ask when your test results will be ready and how you will get your results.  Make sure you get your test results. Document Released: 08/07/2000 Document Revised: 05/31/2013 Document Reviewed: 04/17/2013 [facility] Patient Information 2015 [place], [place]. This information is not intended to replace advice given to you by your health care provider. Make sure you discuss any questions you have with your health care provider. PATIENT INSTRUCTIONS POST-ANESTHESIA  IMMEDIATELY FOLLOWING SURGERY:  Do not drive or operate machinery for the first twenty four hours after surgery.  Do not make any important decisions for twenty four hours after surgery or while taking narcotic pain medications or sedatives.  If you develop intractable nausea and vomiting or a severe headache please notify your doctor immediately.  FOLLOW-UP:  Please make an appointment with your surgeon as instructed. You do not need to follow up with anesthesia unless specifically instructed to do so.  WOUND CARE INSTRUCTIONS (if applicable):  Keep a dry clean dressing on the anesthesia/puncture wound site if there is drainage.  Once the wound has quit draining you may leave it open to air.  Generally you should leave the bandage intact for twenty four hours unless there is drainage.  If the epidural site drains for more than 36-48 hours please call the anesthesia department.  QUESTIONS?:  Please feel free to call your physician or the hospital operator if you have any questions, and they will be happy to assist you.

## 2014-06-07 ENCOUNTER — Encounter (HOSPITAL_COMMUNITY)
Admission: RE | Admit: 2014-06-07 | Discharge: 2014-06-07 | Disposition: A | Discharge status: Home / Self Care | Payer: 59 | Source: Ambulatory Visit | Attending: Gastroenterology | Admitting: Gastroenterology

## 2014-06-07 ENCOUNTER — Encounter (HOSPITAL_COMMUNITY): Payer: Self-pay

## 2014-06-07 DIAGNOSIS — K219 Gastro-esophageal reflux disease without esophagitis: Secondary | ICD-10-CM | POA: Insufficient documentation

## 2014-06-07 DIAGNOSIS — Z0000 Encounter for general adult medical examination without abnormal findings: Secondary | ICD-10-CM | POA: Insufficient documentation

## 2014-06-07 DIAGNOSIS — R0683 Snoring: Secondary | ICD-10-CM | POA: Diagnosis not present

## 2014-06-07 DIAGNOSIS — M797 Fibromyalgia: Secondary | ICD-10-CM | POA: Diagnosis not present

## 2014-06-07 DIAGNOSIS — M2550 Pain in unspecified joint: Secondary | ICD-10-CM | POA: Diagnosis not present

## 2014-06-07 DIAGNOSIS — F329 Major depressive disorder, single episode, unspecified: Secondary | ICD-10-CM | POA: Diagnosis not present

## 2014-06-07 DIAGNOSIS — M1990 Unspecified osteoarthritis, unspecified site: Secondary | ICD-10-CM | POA: Diagnosis not present

## 2014-06-07 DIAGNOSIS — R5383 Other fatigue: Secondary | ICD-10-CM | POA: Diagnosis not present

## 2014-06-07 DIAGNOSIS — M791 Myalgia: Secondary | ICD-10-CM | POA: Insufficient documentation

## 2014-06-07 HISTORY — DX: Nausea with vomiting, unspecified: R11.2

## 2014-06-07 HISTORY — DX: Gastro-esophageal reflux disease without esophagitis: K21.9

## 2014-06-07 HISTORY — DX: Mononeuropathy, unspecified: G58.9

## 2014-06-07 HISTORY — DX: Depression, unspecified: F32.A

## 2014-06-07 HISTORY — DX: Unspecified osteoarthritis, unspecified site: M19.90

## 2014-06-07 HISTORY — DX: Major depressive disorder, single episode, unspecified: F32.9

## 2014-06-07 HISTORY — DX: Other specified postprocedural states: Z98.890

## 2014-06-07 LAB — HEMOGLOBIN AND HEMATOCRIT, BLOOD
HCT: 42.3 % (ref 36.0–46.0)
HEMOGLOBIN: 14.2 g/dL (ref 12.0–15.0)

## 2014-06-07 LAB — BASIC METABOLIC PANEL
Anion gap: 13 (ref 5–15)
BUN: 18 mg/dL (ref 6–23)
CALCIUM: 9.4 mg/dL (ref 8.4–10.5)
CO2: 27 mEq/L (ref 19–32)
Chloride: 100 mEq/L (ref 96–112)
Creatinine, Ser: 0.97 mg/dL (ref 0.50–1.10)
GFR calc Af Amer: 77 mL/min — ABNORMAL LOW (ref >90)
GFR calc non Af Amer: 66 mL/min — ABNORMAL LOW (ref >90)
GLUCOSE: 145 mg/dL — AB (ref 70–99)
Potassium: 5.1 mEq/L (ref 3.7–5.3)
SODIUM: 140 meq/L (ref 137–147)

## 2014-06-07 NOTE — Pre-Procedure Instructions (Signed)
Patient given information to sign up for my chart at home. 

## 2014-06-12 ENCOUNTER — Encounter (HOSPITAL_COMMUNITY): Payer: Commercial Managed Care - PPO | Admitting: Anesthesiology

## 2014-06-12 ENCOUNTER — Ambulatory Visit (HOSPITAL_COMMUNITY)
Admission: RE | Admit: 2014-06-12 | Discharge: 2014-06-12 | Disposition: A | Discharge status: Home / Self Care | Payer: Commercial Managed Care - PPO | Source: Ambulatory Visit | Attending: Gastroenterology | Admitting: Gastroenterology

## 2014-06-12 ENCOUNTER — Encounter (HOSPITAL_COMMUNITY): Payer: Self-pay | Admitting: *Deleted

## 2014-06-12 ENCOUNTER — Encounter (HOSPITAL_COMMUNITY)
Admission: RE | Disposition: A | Discharge status: Home / Self Care | Payer: Self-pay | Source: Ambulatory Visit | Attending: Gastroenterology

## 2014-06-12 ENCOUNTER — Ambulatory Visit (HOSPITAL_COMMUNITY): Payer: Commercial Managed Care - PPO | Admitting: Anesthesiology

## 2014-06-12 DIAGNOSIS — K2980 Duodenitis without bleeding: Secondary | ICD-10-CM | POA: Insufficient documentation

## 2014-06-12 DIAGNOSIS — D128 Benign neoplasm of rectum: Secondary | ICD-10-CM | POA: Insufficient documentation

## 2014-06-12 DIAGNOSIS — Z87891 Personal history of nicotine dependence: Secondary | ICD-10-CM | POA: Insufficient documentation

## 2014-06-12 DIAGNOSIS — F329 Major depressive disorder, single episode, unspecified: Secondary | ICD-10-CM | POA: Diagnosis not present

## 2014-06-12 DIAGNOSIS — D125 Benign neoplasm of sigmoid colon: Secondary | ICD-10-CM | POA: Diagnosis not present

## 2014-06-12 DIAGNOSIS — R0681 Apnea, not elsewhere classified: Secondary | ICD-10-CM | POA: Diagnosis not present

## 2014-06-12 DIAGNOSIS — K219 Gastro-esophageal reflux disease without esophagitis: Secondary | ICD-10-CM | POA: Diagnosis not present

## 2014-06-12 DIAGNOSIS — Z79899 Other long term (current) drug therapy: Secondary | ICD-10-CM | POA: Diagnosis not present

## 2014-06-12 DIAGNOSIS — K648 Other hemorrhoids: Secondary | ICD-10-CM | POA: Diagnosis not present

## 2014-06-12 DIAGNOSIS — K644 Residual hemorrhoidal skin tags: Secondary | ICD-10-CM | POA: Diagnosis not present

## 2014-06-12 DIAGNOSIS — Z1211 Encounter for screening for malignant neoplasm of colon: Secondary | ICD-10-CM | POA: Diagnosis not present

## 2014-06-12 DIAGNOSIS — K5730 Diverticulosis of large intestine without perforation or abscess without bleeding: Secondary | ICD-10-CM | POA: Diagnosis not present

## 2014-06-12 DIAGNOSIS — R1310 Dysphagia, unspecified: Secondary | ICD-10-CM

## 2014-06-12 DIAGNOSIS — K222 Esophageal obstruction: Secondary | ICD-10-CM | POA: Insufficient documentation

## 2014-06-12 DIAGNOSIS — K2900 Acute gastritis without bleeding: Secondary | ICD-10-CM | POA: Insufficient documentation

## 2014-06-12 DIAGNOSIS — K621 Rectal polyp: Secondary | ICD-10-CM

## 2014-06-12 DIAGNOSIS — D124 Benign neoplasm of descending colon: Secondary | ICD-10-CM

## 2014-06-12 HISTORY — PX: COLONOSCOPY WITH PROPOFOL: SHX5780

## 2014-06-12 HISTORY — PX: POLYPECTOMY: SHX5525

## 2014-06-12 HISTORY — PX: BIOPSY: SHX5522

## 2014-06-12 HISTORY — PX: ESOPHAGOGASTRODUODENOSCOPY (EGD) WITH PROPOFOL: SHX5813

## 2014-06-12 HISTORY — PX: SAVORY DILATION: SHX5439

## 2014-06-12 SURGERY — COLONOSCOPY WITH PROPOFOL
Anesthesia: Monitor Anesthesia Care

## 2014-06-12 MED ORDER — PROPOFOL 10 MG/ML IV EMUL
INTRAVENOUS | Status: AC
  Filled 2014-06-12: qty 20

## 2014-06-12 MED ORDER — GLYCOPYRROLATE 0.2 MG/ML IJ SOLN
INTRAMUSCULAR | Status: AC
  Filled 2014-06-12: qty 1

## 2014-06-12 MED ORDER — WATER FOR IRRIGATION, STERILE IR SOLN
Status: DC | PRN
  Administered 2014-06-12: 1000 mL via SURGICAL_CAVITY

## 2014-06-12 MED ORDER — ONDANSETRON HCL 4 MG/2ML IJ SOLN: 4.0000 mg | Freq: Once | INTRAMUSCULAR | Status: DC | PRN

## 2014-06-12 MED ORDER — ONDANSETRON HCL 4 MG/2ML IJ SOLN
INTRAMUSCULAR | Status: AC
  Administered 2014-06-12: 4 mg
  Filled 2014-06-12: qty 2

## 2014-06-12 MED ORDER — FENTANYL CITRATE 0.05 MG/ML IJ SOLN
INTRAMUSCULAR | Status: AC
  Filled 2014-06-12: qty 2

## 2014-06-12 MED ORDER — MIDAZOLAM HCL 2 MG/2ML IJ SOLN
1.0000 mg | INTRAMUSCULAR | Status: DC | PRN
  Administered 2014-06-12 (×2): 2 mg via INTRAVENOUS

## 2014-06-12 MED ORDER — FENTANYL CITRATE 0.05 MG/ML IJ SOLN
25.0000 ug | INTRAMUSCULAR | Status: AC
  Administered 2014-06-12 (×2): 25 ug via INTRAVENOUS

## 2014-06-12 MED ORDER — LIDOCAINE VISCOUS 2 % MT SOLN
OROMUCOSAL | Status: AC
  Filled 2014-06-12: qty 15

## 2014-06-12 MED ORDER — LACTATED RINGERS IV SOLN
INTRAVENOUS | Status: DC
  Administered 2014-06-12: 1000 mL via INTRAVENOUS

## 2014-06-12 MED ORDER — MIDAZOLAM HCL 2 MG/2ML IJ SOLN
INTRAMUSCULAR | Status: AC
  Filled 2014-06-12: qty 2

## 2014-06-12 MED ORDER — FENTANYL CITRATE 0.05 MG/ML IJ SOLN
INTRAMUSCULAR | Status: DC | PRN
  Administered 2014-06-12: 25 ug via INTRAVENOUS

## 2014-06-12 MED ORDER — ONDANSETRON HCL 4 MG/2ML IJ SOLN: 4.0000 mg | Freq: Once | INTRAMUSCULAR | Status: DC

## 2014-06-12 MED ORDER — FENTANYL CITRATE 0.05 MG/ML IJ SOLN: 25.0000 ug | INTRAMUSCULAR | Status: DC | PRN

## 2014-06-12 MED ORDER — LIDOCAINE VISCOUS 2 % MT SOLN
7.0000 mL | OROMUCOSAL | Status: AC
  Administered 2014-06-12 (×2): 7 mL via OROMUCOSAL
  Filled 2014-06-12: qty 10

## 2014-06-12 MED ORDER — PROPOFOL INFUSION 10 MG/ML OPTIME
INTRAVENOUS | Status: DC | PRN
  Administered 2014-06-12: 120 ug/kg/min via INTRAVENOUS
  Administered 2014-06-12 (×2): via INTRAVENOUS

## 2014-06-12 MED ORDER — GLYCOPYRROLATE 0.2 MG/ML IJ SOLN
0.2000 mg | Freq: Once | INTRAMUSCULAR | Status: AC
  Administered 2014-06-12: 0.2 mg via INTRAVENOUS

## 2014-06-12 MED ORDER — PROPOFOL 10 MG/ML IV BOLUS
INTRAVENOUS | Status: DC | PRN
  Administered 2014-06-12 (×4): 10 mg via INTRAVENOUS

## 2014-06-12 MED ORDER — MINERAL OIL PO OIL
TOPICAL_OIL | ORAL | Status: AC
  Filled 2014-06-12: qty 30

## 2014-06-12 SURGICAL SUPPLY — 26 items
BLOCK BITE 60FR ADLT L/F BLUE (MISCELLANEOUS) ×2 IMPLANT
ELECT REM PT RETURN 9FT ADLT (ELECTROSURGICAL) ×2
ELECTRODE REM PT RTRN 9FT ADLT (ELECTROSURGICAL) ×1 IMPLANT
FCP BXJMBJMB 240X2.8X (CUTTING FORCEPS)
FLOOR PAD 36X40 (MISCELLANEOUS) ×2
FORCEPS BIOP RAD 4 LRG CAP 4 (CUTTING FORCEPS) ×2 IMPLANT
FORCEPS BIOP RJ4 240 W/NDL (CUTTING FORCEPS)
FORCEPS BXJMBJMB 240X2.8X (CUTTING FORCEPS) IMPLANT
FORMALIN 10 PREFIL 20ML (MISCELLANEOUS) IMPLANT
INJECTOR/SNARE I SNARE (MISCELLANEOUS) IMPLANT
KIT CLEAN ENDO COMPLIANCE (KITS) ×2 IMPLANT
LUBRICANT JELLY 4.5OZ STERILE (MISCELLANEOUS) ×2 IMPLANT
MANIFOLD NEPTUNE II (INSTRUMENTS) ×2 IMPLANT
NEEDLE SCLEROTHERAPY 25GX240 (NEEDLE) IMPLANT
PAD FLOOR 36X40 (MISCELLANEOUS) ×1 IMPLANT
PROBE APC STR FIRE (PROBE) IMPLANT
PROBE INJECTION GOLD (MISCELLANEOUS)
PROBE INJECTION GOLD 7FR (MISCELLANEOUS) IMPLANT
SNARE ROTATE MED OVAL 20MM (MISCELLANEOUS) ×2 IMPLANT
SNARE SHORT THROW 13M SML OVAL (MISCELLANEOUS) ×2 IMPLANT
SYR 50ML LL SCALE MARK (SYRINGE) ×2 IMPLANT
SYR INFLATION 60ML (SYRINGE) IMPLANT
TRAP SPECIMEN MUCOUS 40CC (MISCELLANEOUS) IMPLANT
TUBING ENDO SMARTCAP PENTAX (MISCELLANEOUS) ×2 IMPLANT
TUBING IRRIGATION ENDOGATOR (MISCELLANEOUS) ×2 IMPLANT
WATER STERILE IRR 1000ML POUR (IV SOLUTION) ×2 IMPLANT

## 2014-06-12 NOTE — Discharge Instructions (Signed)
You had 5 polyps removed. YOU HAVE diverticulosis in your left and right colon, SMALL INTERNAL HEMORRHOIDS, AND MODERATE EXTERNAL HEMORRHOIDS. YOU HAVE MODERATE GASTRITIS AND MILD DUODENITIS. I STRETCHED YOUR ESOPHAGUS DUE to A STRICTURE. I BIOPSIED YOUR STOMACH and small bowel.     CONTINUE YOUR WEIGHT LOSS EFFORTS. LOSE 10 LBS. YOUR BODY MASS INDEX IS 40 WHICH MEANS YOU ARE mobidly OBESE. OBESITY IS ASSOCIATED WITH AN INCREASE FOR ALL CANCERS, INCLUDING COLON CANCER. A body mass index over 40 shortens your expected life span 10 yrs.  FOLLOW A HIGH FIBER/LOW FAT DIET. AVOID ITEMS THAT CAUSE BLOATING. SEE INFO BELOW.  CONTINUE PROTONIX once daily 30 MINUTES PRIOR TO BREAKFAST.  USE BENTYL TO PREVENT SEVERE CRAMPS AND DIARRHEA. IT CAN CAUSE CONSTIPATION.  YOUR BIOPSY WILL BE BACK IN 14 DAYS.  Follow up in 4 mos.  Next colonoscopy in 3-5 years.    ENDOSCOPY Care After Read the instructions outlined below and refer to this sheet in the next week. These discharge instructions provide you with general information on caring for yourself after you leave the hospital. While your treatment has been planned according to the most current medical practices available, unavoidable complications occasionally occur. If you have any problems or questions after discharge, call DR. Jesslyn Viglione, [phone].  ACTIVITY  You may resume your regular activity, but move at a slower pace for the next 24 hours.   Take frequent rest periods for the next 24 hours.   Walking will help get rid of the air and reduce the bloated feeling in your belly (abdomen).   No driving for 24 hours (because of the medicine (anesthesia) used during the test).   You may shower.   Do not sign any important legal documents or operate any machinery for 24 hours (because of the anesthesia used during the test).    NUTRITION  Drink plenty of fluids.   You may resume your normal diet as instructed by your doctor.   Begin with a  light meal and progress to your normal diet. Heavy or fried foods are harder to digest and may make you feel sick to your stomach (nauseated).   Avoid alcoholic beverages for 24 hours or as instructed.    MEDICATIONS  You may resume your normal medications.   WHAT YOU CAN EXPECT TODAY  Some feelings of bloating in the abdomen.   Passage of more gas than usual.   Spotting of blood in your stool or on the toilet paper  .  IF YOU HAD POLYPS REMOVED DURING THE ENDOSCOPY:  Eat a soft diet IF YOU HAVE NAUSEA, BLOATING, ABDOMINAL PAIN, OR VOMITING.    FINDING OUT THE RESULTS OF YOUR TEST Not all test results are available during your visit. DR. Oneida Alar WILL CALL YOU WITHIN 14 DAYS OF YOUR PROCEDUE WITH YOUR RESULTS. Do not assume everything is normal if you have not heard from DR. Derwin Reddy IN ONE WEEK, CALL HER OFFICE AT [phone].  SEEK IMMEDIATE MEDICAL ATTENTION AND CALL THE OFFICE: [phone] IF:  You have more than a spotting of blood in your stool.   Your belly is swollen (abdominal distention).   You are nauseated or vomiting.   You have a temperature over 101F.   You have abdominal pain or discomfort that is severe or gets worse throughout the day.   Low-Fat Diet BREADS, CEREALS, PASTA, RICE, DRIED PEAS, AND BEANS These products are high in carbohydrates and most are low in fat. Therefore, they can be increased in the  diet as substitutes for fatty foods. They too, however, contain calories and should not be eaten in excess. Cereals can be eaten for snacks as well as for breakfast.   FRUITS AND VEGETABLES It is good to eat fruits and vegetables. Besides being sources of fiber, both are rich in vitamins and some minerals. They help you get the daily allowances of these nutrients. Fruits and vegetables can be used for snacks and desserts.  MEATS Limit lean meat, chicken, [place], and fish to no more than 6 ounces per day. Beef, Pork, and Lamb Use lean cuts of beef,  pork, and lamb. Lean cuts include:  Extra-lean ground beef.  Arm roast.  Sirloin tip.  Center-cut ham.  Round steak.  Loin chops.  Rump roast.  Tenderloin.  Trim all fat off the outside of meats before cooking. It is not necessary to severely decrease the intake of red meat, but lean choices should be made. Lean meat is rich in protein and contains a highly absorbable form of iron. Premenopausal women, in particular, should avoid reducing lean red meat because this could increase the risk for low red blood cells (iron-deficiency anemia).  Chicken and [place] These are good sources of protein. The fat of poultry can be reduced by removing the skin and underlying fat layers before cooking. Chicken and [place] can be substituted for lean red meat in the diet. Poultry should not be fried or covered with high-fat sauces. Fish and Shellfish Fish is a good source of protein. Shellfish contain cholesterol, but they usually are low in saturated fatty acids. The preparation of fish is important. Like chicken and [place], they should not be fried or covered with high-fat sauces. EGGS Egg whites contain no fat or cholesterol. They can be eaten often. Try 1 to 2 egg whites instead of whole eggs in recipes or use egg substitutes that do not contain yolk. MILK AND DAIRY PRODUCTS Use skim or 1% milk instead of 2% or whole milk. Decrease whole milk, natural, and processed cheeses. Use nonfat or low-fat (2%) cottage cheese or low-fat cheeses made from vegetable oils. Choose nonfat or low-fat (1 to 2%) yogurt. Experiment with evaporated skim milk in recipes that call for heavy cream. Substitute low-fat yogurt or low-fat cottage cheese for sour cream in dips and salad dressings. Have at least 2 servings of low-fat dairy products, such as 2 glasses of skim (or 1%) milk each day to help get your daily calcium intake. FATS AND OILS Reduce the total intake of fats, especially saturated fat. Butterfat, lard, and beef fats  are high in saturated fat and cholesterol. These should be avoided as much as possible. Vegetable fats do not contain cholesterol, but certain vegetable fats, such as coconut oil, palm oil, and palm kernel oil are very high in saturated fats. These should be limited. These fats are often used in bakery goods, processed foods, popcorn, oils, and nondairy creamers. Vegetable shortenings and some peanut butters contain hydrogenated oils, which are also saturated fats. Read the labels on these foods and check for saturated vegetable oils. Unsaturated vegetable oils and fats do not raise blood cholesterol. However, they should be limited because they are fats and are high in calories. Total fat should still be limited to 30% of your daily caloric intake. Desirable liquid vegetable oils are corn oil, cottonseed oil, olive oil, canola oil, safflower oil, soybean oil, and sunflower oil. Peanut oil is not as good, but small amounts are acceptable. Buy a heart-healthy tub margarine that  has no partially hydrogenated oils in the ingredients. Mayonnaise and salad dressings often are made from unsaturated fats, but they should also be limited because of their high calorie and fat content. Seeds, nuts, peanut butter, olives, and avocados are high in fat, but the fat is mainly the unsaturated type. These foods should be limited mainly to avoid excess calories and fat. OTHER EATING TIPS Snacks  Most sweets should be limited as snacks. They tend to be rich in calories and fats, and their caloric content outweighs their nutritional value. Some good choices in snacks are graham crackers, melba toast, soda crackers, bagels (no egg), English muffins, fruits, and vegetables. These snacks are preferable to snack crackers, [place] fries, TORTILLA CHIPS, and POTATO chips. Popcorn should be air-popped or cooked in small amounts of liquid vegetable oil. Desserts Eat fruit, low-fat yogurt, and fruit ices instead of pastries, cake, and  cookies. Sherbet, angel food cake, gelatin dessert, frozen low-fat yogurt, or other frozen products that do not contain saturated fat (pure fruit juice bars, frozen ice pops) are also acceptable.  COOKING METHODS Choose those methods that use little or no fat. They include: Poaching.  Braising.  Steaming.  Grilling.  Baking.  Stir-frying.  Broiling.  Microwaving.  Foods can be cooked in a nonstick pan without added fat, or use a nonfat cooking spray in regular cookware. Limit fried foods and avoid frying in saturated fat. Add moisture to lean meats by using water, broth, cooking wines, and other nonfat or low-fat sauces along with the cooking methods mentioned above. Soups and stews should be chilled after cooking. The fat that forms on top after a few hours in the refrigerator should be skimmed off. When preparing meals, avoid using excess salt. Salt can contribute to raising blood pressure in some people.  EATING AWAY FROM HOME Order entres, potatoes, and vegetables without sauces or butter. When meat exceeds the size of a deck of cards (3 to 4 ounces), the rest can be taken home for another meal. Choose vegetable or fruit salads and ask for low-calorie salad dressings to be served on the side. Use dressings sparingly. Limit high-fat toppings, such as bacon, crumbled eggs, cheese, sunflower seeds, and olives. Ask for heart-healthy tub margarine instead of butter.  High-Fiber Diet A high-fiber diet changes your normal diet to include more whole grains, legumes, fruits, and vegetables. Changes in the diet involve replacing refined carbohydrates with unrefined foods. The calorie level of the diet is essentially unchanged. The Dietary Reference Intake (recommended amount) for adult males is 38 grams per day. For adult females, it is 25 grams per day. Pregnant and lactating women should consume 28 grams of fiber per day. Fiber is the intact part of a plant that is not broken down during digestion.  Functional fiber is fiber that has been isolated from the plant to provide a beneficial effect in the body. PURPOSE  Increase stool bulk.   Ease and regulate bowel movements.   Lower cholesterol.  INDICATIONS THAT YOU NEED MORE FIBER  Constipation and hemorrhoids.   Uncomplicated diverticulosis (intestine condition) and irritable bowel syndrome.   Weight management.   As a protective measure against hardening of the arteries (atherosclerosis), diabetes, and cancer.   GUIDELINES FOR INCREASING FIBER IN THE DIET  Start adding fiber to the diet slowly. A gradual increase of about 5 more grams (2 slices of whole-wheat bread, 2 servings of most fruits or vegetables, or 1 bowl of high-fiber cereal) per day is best. Too rapid  an increase in fiber may result in constipation, flatulence, and bloating.   Drink enough water and fluids to keep your urine clear or pale yellow. Water, juice, or caffeine-free drinks are recommended. Not drinking enough fluid may cause constipation.   Eat a variety of high-fiber foods rather than one type of fiber.   Try to increase your intake of fiber through using high-fiber foods rather than fiber pills or supplements that contain small amounts of fiber.   The goal is to change the types of food eaten. Do not supplement your present diet with high-fiber foods, but replace foods in your present diet.  INCLUDE A VARIETY OF FIBER SOURCES  Replace refined and processed grains with whole grains, canned fruits with fresh fruits, and incorporate other fiber sources. White rice, white breads, and most bakery goods contain little or no fiber.   Brown whole-grain rice, buckwheat oats, and many fruits and vegetables are all good sources of fiber. These include: broccoli, Brussels sprouts, cabbage, cauliflower, beets, sweet potatoes, white potatoes (skin on), carrots, tomatoes, eggplant, squash, berries, fresh fruits, and dried fruits.   Cereals appear to be the richest  source of fiber. Cereal fiber is found in whole grains and bran. Bran is the fiber-rich outer coat of cereal grain, which is largely removed in refining. In whole-grain cereals, the bran remains. In breakfast cereals, the largest amount of fiber is found in those with "bran" in their names. The fiber content is sometimes indicated on the label.   You may need to include additional fruits and vegetables each day.   In baking, for 1 cup white flour, you may use the following substitutions:   1 cup whole-wheat flour minus 2 tablespoons.   1/2 cup white flour plus 1/2 cup whole-wheat flour.    Polyps, Colon  A polyp is extra tissue that grows inside your body. Colon polyps grow in the large intestine. The large intestine, also called the colon, is part of your digestive system. It is a long, hollow tube at the end of your digestive tract where your body makes and stores stool. Most polyps are not dangerous. They are benign. This means they are not cancerous. But over time, some types of polyps can turn into cancer. Polyps that are smaller than a pea are usually not harmful. But larger polyps could someday become or may already be cancerous. To be safe, doctors remove all polyps and test them.   WHO GETS POLYPS? Anyone can get polyps, but certain people are more likely than others. You may have a greater chance of getting polyps if:  You are over 50.   You have had polyps before.   Someone in your family has had polyps.   Someone in your family has had cancer of the large intestine.   Find out if someone in your family has had polyps. You may also be more likely to get polyps if you:   Eat a lot of fatty foods   Smoke   Drink alcohol   Do not exercise  Eat too much   TREATMENT  The caregiver will remove the polyp during sigmoidoscopy or colonoscopy.  PREVENTION There is not one sure way to prevent polyps. You might be able to lower your risk of getting them if you:  Eat more fruits  and vegetables and less fatty food.   Do not smoke.   Avoid alcohol.   Exercise every day.   Lose weight if you are overweight.   Eating more  calcium and folate can also lower your risk of getting polyps. Some foods that are rich in calcium are milk, cheese, and broccoli. Some foods that are rich in folate are chickpeas, kidney beans, and spinach.    REFLUX  SYMPTOMS Common symptoms of GERD are heartburn (burning in your chest). This is worse when lying down or bending over. It may also cause belching, or difficulty swallowing, and indigestion. Some of the things which make GERD worse are:  Increased weight pushes on stomach making acid rise more easily.   Smoking markedly increases acid production.   Alcohol decreases lower esophageal sphincter pressure (valve between stomach and esophagus), allowing acid from stomach into esophagus.   Late evening meals and going to bed with a full stomach increases pressure.   HOME CARE INSTRUCTIONS  Try to achieve and maintain an ideal body weight.   Avoid drinking alcoholic beverages.   DO NOT smokE.   Do not wear tight clothing around your chest or stomach.   Eat smaller meals and eat more frequently. This keeps your stomach from getting too full. Eat slowly.   Do not lie down for 2 or 3 hours after eating. Do not eat or drink anything 1 to 2 hours before going to bed.   Avoid caffeine beverages (colas, coffee, cocoa, tea), fatty foods, citrus fruits and all other foods and drinks that contain acid and that seem to increase the problems.   Avoid bending over, especially after eating OR STRAINING. Anything that increases the pressure in your belly increases the amount of acid that may be pushed up into your esophagus.   Gastritis/DUODENITIS  Gastritis is an inflammation (the body's way of reacting to injury and/or infection) of the stomach. DUODENITIS is an inflammation (the body's way of reacting to injury and/or infection) of the  FIRST PART OF THE SMALL INTESTINES. It is often caused by bacterial (germ) infections. It can also be caused BY ASPIRIN, BC/GOODY POWDER'S, (IBUPROFEN) MOTRIN, OR ALEVE (NAPROXEN), chemicals (including alcohol), SPICY FOODS, and medications. This illness may be associated with generalized malaise (feeling tired, not well), UPPER ABDOMINAL STOMACH cramps, and fever. One common bacterial cause of gastritis is an organism known as H. Pylori. This can be treated with antibiotics.   Hemorrhoids Hemorrhoids are dilated (enlarged) veins around the rectum. Sometimes clots will form in the veins. This makes them swollen and painful. These are called thrombosed hemorrhoids. Causes of hemorrhoids include:  Constipation.   Straining to have a bowel movement.   HEAVY LIFTING HOME CARE INSTRUCTIONS  Eat a well balanced diet and drink 6 to 8 glasses of water every day to avoid constipation. You may also use a bulk laxative.   Avoid straining to have bowel movements.   Keep anal area dry and clean.   Do not use a donut shaped pillow or sit on the toilet for long periods. This increases blood pooling and pain.   Move your bowels when your body has the urge; this will require less straining and will decrease pain and pressure. ]]]

## 2014-06-12 NOTE — Anesthesia Preprocedure Evaluation (Addendum)
Anesthesia Evaluation  Patient identified by MRN, date of birth, ID band Patient awake    Reviewed: Allergy & Precautions, H&P , NPO status , Patient's Chart, lab work & pertinent test results  History of Anesthesia Complications (+) PONV and history of anesthetic complications  Airway Mallampati: II TM Distance: >3 FB Neck ROM: Full    Dental  (+) Teeth Intact   Pulmonary sleep apnea (not tested) , former smoker,  breath sounds clear to auscultation        Cardiovascular negative cardio ROS  Rhythm:Regular Rate:Normal     Neuro/Psych PSYCHIATRIC DISORDERS Depression    GI/Hepatic GERD-  Medicated,  Endo/Other  Morbid obesity  Renal/GU      Musculoskeletal  (+) Arthritis -, Fibromyalgia -L5-S1 HNP hx with radiculopathy   Abdominal   Peds  Hematology   Anesthesia Other Findings   Reproductive/Obstetrics                          Anesthesia Physical Anesthesia Plan  ASA: II  Anesthesia Plan: MAC   Post-op Pain Management:    Induction: Intravenous  Airway Management Planned: Simple Face Mask  Additional Equipment:   Intra-op Plan:   Post-operative Plan:   Informed Consent: I have reviewed the patients History and Physical, chart, labs and discussed the procedure including the risks, benefits and alternatives for the proposed anesthesia with the patient or authorized representative who has indicated his/her understanding and acceptance.     Plan Discussed with:   Anesthesia Plan Comments:         Anesthesia Quick Evaluation

## 2014-06-12 NOTE — Op Note (Signed)
[place], [id number]   ENDOSCOPY PROCEDURE REPORT  PATIENT: Erica, Price  MR#: [id number] BIRTHDATE: 1961/08/24 , 53  yrs. old GENDER: female  ENDOSCOPIST: Barney Drain, MD REFFERED KT:GYBW Hilma Favors, M.D.  PROCEDURE DATE:  2014/07/08 PROCEDURE:   EGD with biopsy and EGD with dilatation over guidewire   INDICATIONS:1.  dyspepsia.   2.  dysphagia. started protonix and sympoms of reflux improved 100%-BMI 39.9. MEDICATIONS: Monitored anesthesia care TOPICAL ANESTHETIC: Viscous Xylocaine  DESCRIPTION OF PROCEDURE:   After the risks benefits and alternatives of the procedure were thoroughly explained, informed consent was obtained.  The     endoscope was introduced through the mouth and advanced to the second portion of the duodenum. The instrument was slowly withdrawn as the mucosa was carefully examined.  Prior to withdrawal of the scope, the guidwire was placed.  The esophagus was dilated successfully.  The patient was recovered in endoscopy and discharged home in satisfactory condition.   ESOPHAGUS: A stricture was found at the gastroesophageal junction. The stenosis was traversable with the endoscope.   STOMACH: Mild non-erosive gastritis (inflammation) was found in the gastric antrum.  Multiple biopsies were performed using cold forceps. DUODENUM: Mild duodenal inflammation was found in the duodenal bulb.   The duodenal mucosa showed no abnormalities in the 2nd part of the duodenum.  Cold forceps biopsies were taken in the bulb and second portion.   Dilation was then performed at the gastroesphageal junction  MINIMAL TO MODERATE RESISTANCE(16-17 MM). Dilator: Savary over guidewire Size(s): 12.8-17 MM Heme: yes (TRACE)  COMPLICATIONS: There were no immediate complications.  ENDOSCOPIC IMPRESSION: 1.   DYSPHAGIA DUE TO UNCONTROLLED GERD/Stricture at the gastroesophageal junction 2.   MILD Non-erosive gastritis AND  DUODENITIS  RECOMMENDATIONS: CONTINUE YOUR WEIGHT LOSS EFFORTS.  LOSE 10 LBS. FOLLOW A HIGH FIBER/LOW FAT DIET.  AVOID ITEMS THAT CAUSE BLOATING.  CONTINUE PROTONIX once daily 30 MINUTES PRIOR TO BREAKFAST. USE BENTYL TO PREVENT SEVERE CRAMPS AND DIARRHEA. BIOPSY WILL BE BACK IN 14 DAYS. Follow up in 4 mos. Next colonoscopy in 3-5 years.  _______________________________ eSignedBarney Drain, MD 08-Jul-2014 11:00 AM   CPT CODES: ICD CODES:  The ICD and CPT codes recommended by this software are interpretations from the data that the clinical staff has captured with the software.  The verification of the translation of this report to the ICD and CPT codes and modifiers is the sole responsibility of the health care institution and practicing physician where this report was generated.  [employer] will not be held responsible for the validity of the ICD and CPT codes included on this report.  AMA assumes no liability for data contained or not contained herein. [employer] is a Designer, television/film set of the [employer].

## 2014-06-12 NOTE — Transfer of Care (Signed)
Immediate Anesthesia Transfer of Care Note  Patient: Erica Price  Procedure(s) Performed: Procedure(s) with comments: COLONOSCOPY WITH PROPOFOL (N/A) - In cecum @ 9:29 ESOPHAGOGASTRODUODENOSCOPY (EGD) WITH PROPOFOL (N/A) SAVORY DILATION (N/A) - 12.8-17 POLYPECTOMY (N/A) BIOPSY (N/A)  Patient Location: PACU  Anesthesia Type:MAC  Level of Consciousness: awake  Airway & Oxygen Therapy: Patient Spontanous Breathing and Patient connected to nasal cannula oxygen  Post-op Assessment: Report given to PACU RN  Post vital signs: Reviewed and stable  Complications: No apparent anesthesia complications

## 2014-06-12 NOTE — Progress Notes (Signed)
REVIEWED-NO ADDITIONAL RECOMMENDATIONS. 

## 2014-06-12 NOTE — H&P (Signed)
Primary Care Physician:  Purvis Kilts, MD Primary Gastroenterologist:  Dr. Oneida Alar  Pre-Procedure History & Physical: HPI:  Erica Price is a 53 y.o. female here for DYSPHAGIA/screening.  Past Medical History  Diagnosis Date  . Fatigue   . Myalgia   . Arthralgia   . Snoring   . Apnea     Poss OSA  . Fibromyalgia   . PONV (postoperative nausea and vomiting)   . GERD (gastroesophageal reflux disease)   . Depression   . Pinched nerve     lumbar-l5-s1  . Arthritis     Past Surgical History  Procedure Laterality Date  . Club foot release Left     x3  . Arthroscopic repair acl Left   . Cholecystectomy  1990s  . Carpal tunnel release Right     Prior to Admission medications   Medication Sig Start Date End Date Taking? Authorizing Provider  Cinnamon 500 MG capsule Take 500 mg by mouth daily.   Yes Historical Provider, MD  DULoxetine (CYMBALTA) 60 MG capsule Take 60 mg by mouth daily.   Yes Historical Provider, MD  furosemide (LASIX) 20 MG tablet Take 20 mg by mouth daily as needed for fluid.    Yes Historical Provider, MD  gabapentin (NEURONTIN) 100 MG capsule Take 100 mg by mouth 2 (two) times daily.  05/25/14  Yes Historical Provider, MD  Ketorolac Tromethamine (TORADOL IJ) Inject as directed every 6 (six) weeks.   Yes Historical Provider, MD  pantoprazole (PROTONIX) 40 MG tablet Take 1 tablet (40 mg total) by mouth daily. Take 30 minutes before breakfast 06/01/14  Yes Orvil Feil, NP  peg 3350 powder (MOVIPREP) 100 G SOLR Take 1 kit (200 g total) by mouth as directed. 06/01/14  Yes Danie Binder, MD  traMADol (ULTRAM) 50 MG tablet Take 50 mg by mouth every 6 (six) hours as needed for moderate pain.  05/25/14  Yes Historical Provider, MD  zolpidem (AMBIEN) 10 MG tablet Take 10 mg by mouth at bedtime.  04/21/14  Yes Historical Provider, MD  dicyclomine (BENTYL) 10 MG capsule Take 1 capsule (10 mg total) by mouth 4 (four) times daily -  before meals and at bedtime. For loose  stool and abdominal crmaping 06/01/14   Orvil Feil, NP    Allergies as of 06/01/2014  . (No Known Allergies)    Family History  Problem Relation Age of Onset  . Heart failure Father     Died, 78  . Hypertension Mother     Living, 68  . Breast cancer Mother   . Kidney cancer Mother   . Hypercholesterolemia Son   . Colon cancer Neg Hx     History   Social History  . Marital Status: Married    Spouse Name: N/A    Number of Children: N/A  . Years of Education: N/A   Occupational History  . Not on file.   Social History Main Topics  . Smoking status: Former Smoker -- 0.50 packs/day for 20 years    Types: Cigarettes    Quit date: 10/18/2011  . Smokeless tobacco: Not on file  . Alcohol Use: 0.5 - 1.5 oz/week    1-3 drink(s) per week     Comment: Rare, only holidays  . Drug Use: No  . Sexual Activity: Yes    Birth Control/ Protection: Post-menopausal   Other Topics Concern  . Not on file   Social History Narrative   She is currently unemployed.  She was terminated on 06/15/2013.  She was previously a Loss adjuster, chartered, worked at [employer], and worked in Charity fundraiser for 25 years.   She lives with husband.  They have two children.    Review of Systems: See HPI, otherwise negative ROS   Physical Exam: BP 125/84  Pulse 68  Temp(Src) 98.5 F (36.9 C) (Oral)  Resp 18  Ht 5' 2"  (1.575 m)  Wt 216 lb (97.977 kg)  BMI 39.50 kg/m2  SpO2 100% General:   Alert,  pleasant and cooperative in NAD Head:  Normocephalic and atraumatic. Neck:  Supple; Lungs:  Clear throughout to auscultation.    Heart:  Regular rate and rhythm. Abdomen:  Soft, nontender and nondistended. Normal bowel sounds, without guarding, and without rebound.   Neurologic:  Alert and  oriented x4;  grossly normal neurologically.  Impression/Plan:     DYSPHAGIA/screening  PLAN:  EGD/DIL/tcs TODAY

## 2014-06-12 NOTE — Op Note (Signed)
[facility] [address] [place], [address]   COLONOSCOPY PROCEDURE REPORT  PATIENT: Erica, Price  MR#: [id number] BIRTHDATE: [birth year] , 53  yrs. old GENDER: female ENDOSCOPIST: Barney Drain, MD REFERRED SM:OLMB Hilma Favors, M.D. PROCEDURE DATE:  07/06/2014 PROCEDURE:   Colonoscopy with snare and cold biopsy polypectomy INDICATIONS:average risk for colon cancer. MEDICATIONS: Monitored anesthesia care  DESCRIPTION OF PROCEDURE:    Physical exam was performed.  Informed consent was obtained from the patient after explaining the benefits, risks, and alternatives to procedure.  The patient was connected to monitor and placed in left lateral position. Continuous oxygen was provided by nasal cannula and IV medicine administered through an indwelling cannula.  After administration of sedation and rectal exam, the patients rectum was intubated and the     colonoscope was advanced under direct visualization to the ileum.  The scope was removed slowly by carefully examining the color, texture, anatomy, and integrity mucosa on the way out.  The patient was recovered in endoscopy and discharged home in satisfactory condition.    COLON FINDINGS: Three sessile polyps ranging from 6 to 5mm in size were found in the sigmoid colon(2) and descending colon.  A polypectomy was performed using snare cautery.  The resection was complete, the polyp tissue was completely retrieved and sent to histology, Two sessile polyps ranging from 2 to 60mm in size were found in the rectum.  A polypectomy was performed with cold forceps.  , There was diverticulosis noted in the descending colon, sigmoid colon, and ascending colon with associated muscular hypertrophy.  , Small internal hemorrhoids were found.  , and Moderate sized external hemorrhoids were found.  PREP QUALITY: excellent.  CECAL W/D TIME: 24 mins COMPLICATIONS: None  ENDOSCOPIC IMPRESSION: 1.   FIVE colon polyps removed 2.    MODERATE diverticulosis in the descending colon, sigmoid colon, and ascending colon 3.   Small internal hemorrhoids 4.   Moderate sized external hemorrhoids  RECOMMENDATIONS: CONTINUE YOUR WEIGHT LOSS EFFORTS.  LOSE 10 LBS. FOLLOW A HIGH FIBER/LOW FAT DIET.  AVOID ITEMS THAT CAUSE BLOATING.  CONTINUE PROTONIX once daily 30 MINUTES PRIOR TO BREAKFAST. USE BENTYL TO PREVENT SEVERE CRAMPS AND DIARRHEA. BIOPSY WILL BE BACK IN 14 DAYS. Follow up in 4 mos. Next colonoscopy in 3-5 years.   _______________________________ Lorrin MaisBarney Drain, MD 07/06/2014 10:49 AM  CPT CODES: ICD CODES:  The ICD and CPT codes recommended by this software are interpretations from the data that the clinical staff has captured with the software.  The verification of the translation of this report to the ICD and CPT codes and modifiers is the sole responsibility of the health care institution and practicing physician where this report was generated.  [employer]. will not be held responsible for the validity of the ICD and CPT codes included on this report.  AMA assumes no liability for data contained or not contained herein. CPT is a Designer, television/film set of the [employer].

## 2014-06-12 NOTE — Anesthesia Postprocedure Evaluation (Signed)
  Anesthesia Post-op Note  Patient: Erica Price  Procedure(s) Performed: Procedure(s) with comments: COLONOSCOPY WITH PROPOFOL (N/A) - In cecum @ 9:29 ESOPHAGOGASTRODUODENOSCOPY (EGD) WITH PROPOFOL (N/A) SAVORY DILATION (N/A) - 12.8-17 POLYPECTOMY (N/A) BIOPSY (N/A)  Patient Location: PACU  Anesthesia Type:MAC  Level of Consciousness: awake, alert , oriented and patient cooperative  Airway and Oxygen Therapy: Patient Spontanous Breathing and Patient connected to nasal cannula oxygen  Post-op Pain: none  Post-op Assessment: Post-op Vital signs reviewed, Patient's Cardiovascular Status Stable, Respiratory Function Stable, Patent Airway and No signs of Nausea or vomiting  Post-op Vital Signs: Reviewed and stable  Last Vitals:  Filed Vitals:   06/12/14 1026  BP:   Pulse: 77  Temp: 36.6 C  Resp: 18    Complications: No apparent anesthesia complications

## 2014-06-13 ENCOUNTER — Encounter (HOSPITAL_COMMUNITY): Payer: Self-pay | Admitting: Gastroenterology

## 2014-06-19 ENCOUNTER — Telehealth: Payer: Self-pay | Admitting: Gastroenterology

## 2014-06-19 NOTE — Telephone Encounter (Signed)
TCS ON RECALL

## 2014-06-19 NOTE — Telephone Encounter (Signed)
Please call pt. Erica Price stomach Bx shows mild gastritis. She had simple adenomas removed.  CONTINUE YOUR WEIGHT LOSS EFFORTS. LOSE 10 LBS. FOLLOW A HIGH FIBER/LOW FAT DIET. AVOID ITEMS THAT CAUSE BLOATING.   CONTINUE PROTONIX once daily 30 MINUTES PRIOR TO BREAKFAST.  USE BENTYL TO PREVENT SEVERE CRAMPS AND DIARRHEA. IT CAN CAUSE CONSTIPATION.  Follow up in 4  e30 dysphagia/DYSPEPSIA/DIARRHEA .  Next colonoscopy in 3 years.

## 2014-06-20 NOTE — Telephone Encounter (Signed)
Pt is aware of results. 

## 2014-06-21 ENCOUNTER — Encounter: Payer: Self-pay | Admitting: Gastroenterology

## 2014-06-25 ENCOUNTER — Encounter: Payer: Self-pay | Admitting: Gastroenterology

## 2014-07-02 ENCOUNTER — Telehealth: Payer: Self-pay | Admitting: Neurology

## 2014-07-02 NOTE — Telephone Encounter (Signed)
recv'd request for records from [facility]. Request forwarded via interoffice mail to HIM at [facility] for processing. / Gayleen Orem

## 2014-07-12 ENCOUNTER — Telehealth: Payer: Self-pay | Admitting: Neurology

## 2014-07-12 NOTE — Telephone Encounter (Signed)
recv'd records request from [facility] by mail on 07/10/14. Request forwarded via inter-office mail to HIM at [facility] for processing / Sherri S.

## 2014-09-25 ENCOUNTER — Ambulatory Visit (HOSPITAL_COMMUNITY)
Admission: RE | Admit: 2014-09-25 | Discharge: 2014-09-25 | Disposition: A | Discharge status: Home / Self Care | Payer: Commercial Managed Care - PPO | Source: Ambulatory Visit | Attending: Family Medicine | Admitting: Family Medicine

## 2014-09-25 ENCOUNTER — Other Ambulatory Visit (HOSPITAL_COMMUNITY): Payer: Self-pay | Admitting: Family Medicine

## 2014-09-25 DIAGNOSIS — M79671 Pain in right foot: Secondary | ICD-10-CM

## 2014-09-26 ENCOUNTER — Other Ambulatory Visit: Payer: Self-pay

## 2014-09-26 MED ORDER — PANTOPRAZOLE SODIUM 40 MG PO TBEC: 40.0000 mg | DELAYED_RELEASE_TABLET | Freq: Every day | ORAL | Status: DC

## 2014-10-08 ENCOUNTER — Ambulatory Visit: Payer: 59 | Admitting: Gastroenterology

## 2014-10-09 ENCOUNTER — Encounter: Payer: Self-pay | Admitting: Gastroenterology

## 2014-10-31 ENCOUNTER — Ambulatory Visit (INDEPENDENT_AMBULATORY_CARE_PROVIDER_SITE_OTHER): Payer: Commercial Managed Care - PPO | Admitting: Gastroenterology

## 2014-10-31 ENCOUNTER — Encounter: Payer: Self-pay | Admitting: Gastroenterology

## 2014-10-31 VITALS — BP 133/85 | HR 83 | Temp 98.1°F | Ht 62.0 in | Wt 227.0 lb

## 2014-10-31 DIAGNOSIS — Z860100 Personal history of colon polyps, unspecified: Secondary | ICD-10-CM | POA: Insufficient documentation

## 2014-10-31 DIAGNOSIS — K219 Gastro-esophageal reflux disease without esophagitis: Secondary | ICD-10-CM

## 2014-10-31 MED ORDER — DICYCLOMINE HCL 10 MG PO CAPS: 10.0000 mg | ORAL_CAPSULE | Freq: Three times a day (TID) | ORAL | Status: DC

## 2014-10-31 NOTE — Assessment & Plan Note (Signed)
Doing well with Bentyl prn. Increase in loose stool secondary to metformin; she will be seeing her PCP this week for medication adjustment. Next surveillance in 2018.

## 2014-10-31 NOTE — Progress Notes (Signed)
CC'ED TO PCP 

## 2014-10-31 NOTE — Assessment & Plan Note (Signed)
Dysphagia resolved after dilation of GE stricture. Protonix with much improvement in symptoms. Continue diet and behavior modification. Return in 6 months.

## 2014-10-31 NOTE — Patient Instructions (Signed)
Continue to take Protonix each morning, 30 minutes before breakfast. I have refilled Bentyl for you to take up to 4 times a day as needed for loose stools and belly cramping.   We will see you in 6 months!

## 2014-10-31 NOTE — Progress Notes (Signed)
Referring Provider: [name], MD Primary Care Physician:  Purvis Kilts, MD  Chief Complaint  Patient presents with  . Follow-up    HPI:   Erica Price is a 54 y.o. female presenting today with a history of GERD, dysphagia, and likely IBS. Recent EGD with dilation of stricture at GE junction, gastritis, duodenitis. Colonoscopy with multiple polyps, due for surveillance in 3 years (2018).   Bentyl has helped with loose stool and abdominal cramping. Started on metformin in the interim from last visit with increase in stools.Sees Dr. Hilma Favors this week to hopefully change medication. No issues with constipation.  Following a high fiber diet. Taking Protonix once each morning, "loves that stuff". Dysphagia resolved.   Past Medical History  Diagnosis Date  . Fatigue   . Myalgia   . Arthralgia   . Snoring   . Apnea     Poss OSA  . Fibromyalgia   . PONV (postoperative nausea and vomiting)   . GERD (gastroesophageal reflux disease)   . Depression   . Pinched nerve     lumbar-l5-s1  . Arthritis     Past Surgical History  Procedure Laterality Date  . Club foot release Left     x3  . Arthroscopic repair acl Left   . Cholecystectomy  1990s  . Carpal tunnel release Right   . Colonoscopy with propofol N/A 06/12/2014    Dr. Suzette Battiest colon polyps removed/moderate divertiiculosis/small internal hemorrhoids/moderate sized external hemorrhoids. tubular adenomas. Next surveillance in Oct 2018.   Marland Kitchen Esophagogastroduodenoscopy (egd) with propofol N/A 06/12/2014    Dr. 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 benign gastric and duodenal biopsies  . Savory dilation N/A 06/12/2014    Procedure: SAVORY DILATION;  Surgeon: Danie Binder, MD;  Location: [facility];  Service: Endoscopy;  Laterality: N/A;  12.8-17  . Polypectomy N/A 06/12/2014    Procedure: POLYPECTOMY;  Surgeon: Danie Binder, MD;  Location: [facility];  Service: Endoscopy;   Laterality: N/A;  . Esophageal biopsy N/A 06/12/2014    Procedure: BIOPSY;  Surgeon: Danie Binder, MD;  Location: [facility];  Service: Endoscopy;  Laterality: N/A;    Current Outpatient Prescriptions  Medication Sig Dispense Refill  . Cinnamon 500 MG capsule Take 500 mg by mouth daily.    Marland Kitchen dicyclomine (BENTYL) 10 MG capsule Take 1 capsule (10 mg total) by mouth 4 (four) times daily -  before meals and at bedtime. For loose stool and abdominal crmaping 120 capsule 11  . DULoxetine (CYMBALTA) 60 MG capsule Take 60 mg by mouth daily. 60 in am  30 at night    . furosemide (LASIX) 20 MG tablet Take 20 mg by mouth daily as needed for fluid.     Marland Kitchen gabapentin (NEURONTIN) 100 MG capsule Take 300 mg by mouth 2 (two) times daily.     Marland Kitchen Ketorolac Tromethamine (TORADOL IJ) Inject as directed every 6 (six) weeks.    . metFORMIN (GLUCOPHAGE) 500 MG tablet Take 500 mg by mouth 2 (two) times daily with a meal.    . pantoprazole (PROTONIX) 40 MG tablet Take 1 tablet (40 mg total) by mouth daily. Take 30 minutes before breakfast 30 tablet 11  . traMADol (ULTRAM) 50 MG tablet Take 50 mg by mouth every 6 (six) hours as needed for moderate pain.     Marland Kitchen zolpidem (AMBIEN) 10 MG tablet Take 10 mg by mouth at bedtime.      No  current facility-administered medications for this visit.    Allergies as of 10/31/2014  . (No Known Allergies)    Family History  Problem Relation Age of Onset  . Heart failure Father     Died, 78  . Hypertension Mother     Living, 41  . Breast cancer Mother   . Kidney cancer Mother   . Hypercholesterolemia Son   . Colon cancer Neg Hx     History   Social History  . Marital Status: Married    Spouse Name: N/A  . Number of Children: N/A  . Years of Education: N/A   Social History Main Topics  . Smoking status: Former Smoker -- 0.50 packs/day for 20 years    Types: Cigarettes    Quit date: 10/18/2011  . Smokeless tobacco: Not on file  . Alcohol Use: 0.5 - 1.5 oz/week     1-3 drink(s) per week     Comment: Rare, only holidays  . Drug Use: No  . Sexual Activity: Yes    Birth Control/ Protection: Post-menopausal   Other Topics Concern  . None   Social History Narrative   She is currently unemployed.  She was terminated on 06/15/2013.  She was previously a Loss adjuster, chartered, worked at [employer], and worked in Charity fundraiser for 25 years.   She lives with husband.  They have two children.    Review of Systems: As mentioned in HPI  Physical Exam: BP 133/85 mmHg  Pulse 83  Temp(Src) 98.1 F (36.7 C) (Oral)  Ht 5\' 2"  (1.575 m)  Wt 227 lb (102.967 kg)  BMI 41.51 kg/m2 General:   Alert and oriented. No distress noted. Pleasant and cooperative.  Head:  Normocephalic and atraumatic. Eyes:  Conjuctiva clear without scleral icterus. Abdomen:  +BS, soft, non-tender and non-distended. No rebound or guarding. No HSM or masses noted. Msk:  Symmetrical without gross deformities. Normal posture. Extremities:  Without edema. Neurologic:  Alert and  oriented x4;  grossly normal neurologically. Psych:  Alert and cooperative. Normal mood and affect.

## 2015-03-25 ENCOUNTER — Encounter: Payer: Self-pay | Admitting: Gastroenterology

## 2015-05-23 ENCOUNTER — Ambulatory Visit (INDEPENDENT_AMBULATORY_CARE_PROVIDER_SITE_OTHER): Payer: Commercial Managed Care - PPO | Admitting: Gastroenterology

## 2015-05-23 ENCOUNTER — Encounter: Payer: Self-pay | Admitting: Gastroenterology

## 2015-05-23 VITALS — BP 122/80 | HR 69 | Temp 98.3°F | Ht 62.0 in | Wt 213.6 lb

## 2015-05-23 DIAGNOSIS — K219 Gastro-esophageal reflux disease without esophagitis: Secondary | ICD-10-CM

## 2015-05-23 DIAGNOSIS — R140 Abdominal distension (gaseous): Secondary | ICD-10-CM

## 2015-05-23 NOTE — Patient Instructions (Signed)
Review the bloating handout.   Continue Protonix once daily and Bentyl as needed.   We will see you back in 1 year or sooner if needed!

## 2015-05-23 NOTE — Progress Notes (Signed)
Referring Provider: [name], MD Primary Care Physician:  Purvis Kilts, MD  Primary GI: Dr. Oneida Alar   Chief Complaint  Patient presents with  . Follow-up    HPI:   Erica Price is a 54 y.o. female presenting today with a history of GERD, dysphagia, and likely IBS. Recent EGD with dilation of stricture at GE junction, gastritis, duodenitis. Colonoscopy with multiple polyps, due for surveillance in 3 years (2018). Bentyl prn for loose stool.   Protonix for GERD. No dysphagia. Occasional bloating. Has spells of diarrhea but takes Bentyl. Bloating is sporadic. Unable to pinpoint. Every "once in awhile". Chews a few gas tablets with good results. Doesn't drink milk but does love cheese. No weight loss or lack of appetite. No rectal bleeding. No N/V. Eats a lot of green beans, pinto beans, navy, northern beans.   Past Medical History  Diagnosis Date  . Fatigue   . Myalgia   . Arthralgia   . Snoring   . Apnea     Poss OSA  . Fibromyalgia   . PONV (postoperative nausea and vomiting)   . GERD (gastroesophageal reflux disease)   . Depression   . Pinched nerve     lumbar-l5-s1  . Arthritis     Past Surgical History  Procedure Laterality Date  . Club foot release Left     x3  . Arthroscopic repair acl Left   . Cholecystectomy  1990s  . Carpal tunnel release Right   . Colonoscopy with propofol N/A 06/12/2014    Dr. Suzette Battiest colon polyps removed/moderate divertiiculosis/small internal hemorrhoids/moderate sized external hemorrhoids. tubular adenomas. Next surveillance in Oct 2018.   Marland Kitchen Esophagogastroduodenoscopy (egd) with propofol N/A 06/12/2014    Dr. 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 benign gastric and duodenal biopsies  . Savory dilation N/A 06/12/2014    Procedure: SAVORY DILATION;  Surgeon: Danie Binder, MD;  Location: [facility];  Service: Endoscopy;  Laterality: N/A;  12.8-17  . Polypectomy N/A 06/12/2014   Procedure: POLYPECTOMY;  Surgeon: Danie Binder, MD;  Location: [facility];  Service: Endoscopy;  Laterality: N/A;  . Esophageal biopsy N/A 06/12/2014    Procedure: BIOPSY;  Surgeon: Danie Binder, MD;  Location: [facility];  Service: Endoscopy;  Laterality: N/A;    Current Outpatient Prescriptions  Medication Sig Dispense Refill  . azelastine (OPTIVAR) 0.05 % ophthalmic solution   3  . Cinnamon 500 MG capsule Take 500 mg by mouth daily.    Marland Kitchen dicyclomine (BENTYL) 10 MG capsule Take 1 capsule (10 mg total) by mouth 4 (four) times daily -  before meals and at bedtime. For loose stool and abdominal crmaping 120 capsule 11  . DULoxetine (CYMBALTA) 30 MG capsule   11  . DULoxetine (CYMBALTA) 60 MG capsule Take 60 mg by mouth daily. 60 in am  30 at night    . furosemide (LASIX) 20 MG tablet Take 20 mg by mouth daily as needed for fluid.     Marland Kitchen gabapentin (NEURONTIN) 100 MG capsule Take 1,800 mg by mouth 2 (two) times daily.     Marland Kitchen Ketorolac Tromethamine (TORADOL IJ) Inject as directed every 6 (six) weeks.    . lidocaine (LINDAMANTLE) 3 % CREA cream   4  . pantoprazole (PROTONIX) 40 MG tablet Take 1 tablet (40 mg total) by mouth daily. Take 30 minutes before breakfast 30 tablet 11  . traMADol (ULTRAM) 50 MG tablet Take 50 mg by mouth every 6 (  six) hours as needed for moderate pain.     Marland Kitchen zolpidem (AMBIEN) 10 MG tablet Take 10 mg by mouth at bedtime.     Marland Kitchen JANUMET 50-500 MG tablet   1  . metFORMIN (GLUCOPHAGE) 500 MG tablet Take 500 mg by mouth 2 (two) times daily with a meal.     No current facility-administered medications for this visit.    Allergies as of 05/23/2015  . (No Known Allergies)    Family History  Problem Relation Age of Onset  . Heart failure Father     Died, 78  . Hypertension Mother     Living, 77  . Breast cancer Mother   . Kidney cancer Mother   . Hypercholesterolemia Son   . Colon cancer Neg Hx     Social History   Social History  . Marital Status: Married    Spouse  Name: N/A  . Number of Children: N/A  . Years of Education: N/A   Social History Main Topics  . Smoking status: Former Smoker -- 0.50 packs/day for 20 years    Types: Cigarettes    Quit date: 10/18/2011  . Smokeless tobacco: None  . Alcohol Use: 0.5 - 1.5 oz/week    1-3 drink(s) per week     Comment: Rare, only holidays  . Drug Use: No  . Sexual Activity: Yes    Birth Control/ Protection: Post-menopausal   Other Topics Concern  . None   Social History Narrative   She is currently unemployed.  She was terminated on 06/15/2013.  She was previously a Loss adjuster, chartered, worked at [employer], and worked in Charity fundraiser for 25 years.   She lives with husband.  They have two children.    Review of Systems: Negative unless mentioned in HPI   Physical Exam: BP 122/80 mmHg  Pulse 69  Temp(Src) 98.3 F (36.8 C)  Ht 5\' 2"  (1.575 m)  Wt 213 lb 9.6 oz (96.888 kg)  BMI 39.06 kg/m2 General:   Alert and oriented. No distress noted. Pleasant and cooperative.  Head:  Normocephalic and atraumatic. Eyes:  Conjuctiva clear without scleral icterus. Heart:  S1, S2 present without murmurs, rubs, or gallops. Regular rate and rhythm. Abdomen:  +BS, soft, obese,  non-tender and non-distended. No rebound or guarding. No HSM or masses noted. Msk:  Symmetrical without gross deformities. Normal posture. Extremities:  Without edema. Neurologic:  Alert and  oriented x4;  grossly normal neurologically. Psych:  Alert and cooperative. Normal mood and affect.

## 2015-05-28 NOTE — Progress Notes (Signed)
cc'ed to pcp °

## 2015-05-28 NOTE — Assessment & Plan Note (Signed)
Non-specific, likely related to dietary intake (eating large amounts of beans). Review gas/bloat handout. Continue Bentyl only as needed for rare diarrhea, which helps her. Return in 1 year or sooner if needed. Next colonoscopy 2018 due to history of multiple polyps.

## 2015-05-28 NOTE — Assessment & Plan Note (Signed)
Continue Protonix once daily. No alarm features. Return in 1 year.

## 2015-09-19 ENCOUNTER — Other Ambulatory Visit: Payer: Self-pay | Admitting: Gastroenterology

## 2016-03-25 ENCOUNTER — Encounter: Payer: Self-pay | Admitting: Gastroenterology

## 2016-05-05 ENCOUNTER — Ambulatory Visit (HOSPITAL_COMMUNITY)
Admission: RE | Admit: 2016-05-05 | Discharge: 2016-05-05 | Disposition: A | Discharge status: Home / Self Care | Payer: Commercial Managed Care - PPO | Source: Ambulatory Visit | Attending: Family Medicine | Admitting: Family Medicine

## 2016-05-05 ENCOUNTER — Other Ambulatory Visit (HOSPITAL_COMMUNITY): Payer: Self-pay | Admitting: Family Medicine

## 2016-05-05 DIAGNOSIS — J209 Acute bronchitis, unspecified: Secondary | ICD-10-CM | POA: Insufficient documentation

## 2016-09-21 ENCOUNTER — Other Ambulatory Visit: Payer: Self-pay | Admitting: Gastroenterology

## 2017-01-20 ENCOUNTER — Other Ambulatory Visit: Payer: Self-pay | Admitting: Gastroenterology

## 2017-04-15 ENCOUNTER — Other Ambulatory Visit: Payer: Self-pay | Admitting: Nurse Practitioner

## 2017-04-15 NOTE — Telephone Encounter (Signed)
LMOM for a return call.  

## 2017-04-15 NOTE — Telephone Encounter (Signed)
Letter mailed to pt to call for appt before more refills.

## 2017-04-15 NOTE — Telephone Encounter (Signed)
Please tell the patient I have sent in a refill to last 3 months. However, at that point she will not been seen in over 2 years and will need an office visit for further refills.

## 2017-05-04 ENCOUNTER — Encounter: Payer: Self-pay | Admitting: Gastroenterology

## 2017-05-05 DIAGNOSIS — J302 Other seasonal allergic rhinitis: Secondary | ICD-10-CM | POA: Diagnosis not present

## 2017-05-05 DIAGNOSIS — Z1389 Encounter for screening for other disorder: Secondary | ICD-10-CM | POA: Diagnosis not present

## 2017-05-05 DIAGNOSIS — M797 Fibromyalgia: Secondary | ICD-10-CM | POA: Diagnosis not present

## 2017-05-05 DIAGNOSIS — E1165 Type 2 diabetes mellitus with hyperglycemia: Secondary | ICD-10-CM | POA: Diagnosis not present

## 2017-05-05 DIAGNOSIS — I10 Essential (primary) hypertension: Secondary | ICD-10-CM | POA: Diagnosis not present

## 2017-05-05 DIAGNOSIS — E782 Mixed hyperlipidemia: Secondary | ICD-10-CM | POA: Diagnosis not present

## 2017-05-31 ENCOUNTER — Encounter: Payer: Self-pay | Admitting: Gastroenterology

## 2017-05-31 ENCOUNTER — Telehealth: Payer: Self-pay | Admitting: Gastroenterology

## 2017-05-31 NOTE — Telephone Encounter (Signed)
Pt called to make OV and is aware it'll be NOV. She is due for her 3 yr colonoscopy and is having problems with her IBS. She said that her dicyclomine prescription had expired and she was needing more, but we haven't seen her in the office since 2016. Please advise if we can call some in to hold her until her OV in NOV. She now uses [facility].

## 2017-06-01 MED ORDER — DICYCLOMINE HCL 10 MG PO CAPS: 10.0000 mg | ORAL_CAPSULE | Freq: Three times a day (TID) | ORAL | 0 refills | Status: DC

## 2017-06-01 NOTE — Telephone Encounter (Signed)
Forwarding to refill box.  

## 2017-06-01 NOTE — Telephone Encounter (Signed)
I sent Bentyl to [facility], just one month supply.

## 2017-06-01 NOTE — Telephone Encounter (Signed)
Pt is aware. Updated her phone number and address.

## 2017-06-28 DIAGNOSIS — Z23 Encounter for immunization: Secondary | ICD-10-CM | POA: Diagnosis not present

## 2017-06-28 DIAGNOSIS — J069 Acute upper respiratory infection, unspecified: Secondary | ICD-10-CM | POA: Diagnosis not present

## 2017-06-28 DIAGNOSIS — M797 Fibromyalgia: Secondary | ICD-10-CM | POA: Diagnosis not present

## 2017-06-28 DIAGNOSIS — G894 Chronic pain syndrome: Secondary | ICD-10-CM | POA: Diagnosis not present

## 2017-06-28 DIAGNOSIS — J209 Acute bronchitis, unspecified: Secondary | ICD-10-CM | POA: Diagnosis not present

## 2017-07-01 ENCOUNTER — Encounter: Payer: Self-pay | Admitting: *Deleted

## 2017-07-01 ENCOUNTER — Encounter: Payer: Self-pay | Admitting: Gastroenterology

## 2017-07-01 ENCOUNTER — Ambulatory Visit: Payer: Medicare Other | Admitting: Gastroenterology

## 2017-07-01 ENCOUNTER — Telehealth: Payer: Self-pay | Admitting: *Deleted

## 2017-07-01 ENCOUNTER — Other Ambulatory Visit: Payer: Self-pay | Admitting: *Deleted

## 2017-07-01 DIAGNOSIS — K219 Gastro-esophageal reflux disease without esophagitis: Secondary | ICD-10-CM | POA: Diagnosis not present

## 2017-07-01 DIAGNOSIS — R194 Change in bowel habit: Secondary | ICD-10-CM | POA: Diagnosis not present

## 2017-07-01 DIAGNOSIS — R140 Abdominal distension (gaseous): Secondary | ICD-10-CM | POA: Diagnosis not present

## 2017-07-01 DIAGNOSIS — Z86010 Personal history of colonic polyps: Secondary | ICD-10-CM

## 2017-07-01 DIAGNOSIS — R1310 Dysphagia, unspecified: Secondary | ICD-10-CM | POA: Diagnosis not present

## 2017-07-01 DIAGNOSIS — R1319 Other dysphagia: Secondary | ICD-10-CM

## 2017-07-01 MED ORDER — PEG-KCL-NACL-NASULF-NA ASC-C 100 G PO SOLR: 1.0000 | Freq: Once | ORAL | 0 refills | Status: AC

## 2017-07-01 MED ORDER — DICYCLOMINE HCL 10 MG PO CAPS: 10.0000 mg | ORAL_CAPSULE | Freq: Three times a day (TID) | ORAL | 11 refills | Status: DC

## 2017-07-01 NOTE — Assessment & Plan Note (Signed)
SYMPTOMS CONTROLLED/RESOLVED.  CONTINUE PROTONIX. TAKE 30 MINUTES PRIOR TO BREAKFAST. CONTINUE TO MONITOR SYMPTOMS. 

## 2017-07-01 NOTE — Assessment & Plan Note (Signed)
SYMPTOMS CONTROLLED/RESOLVED.  CONTINUE TO MONITOR SYMPTOMS. 

## 2017-07-01 NOTE — Patient Instructions (Signed)
WE WILL SCHEDULE YOUR colonoscopy(TCS) FOR CHANGE IN BOWEL HABITS/PERSONAL HISTORY OF POLYPS. FOLLOW THE INSTRUCTIONS BELOW.      START BOWEL PREP ON DAY BEFORE       FULL LIQUID DIET ON DAY BEFORE TCS. SEE INFO BELOW.      HOLD BENTYL ON DAY BEFORE TCS      HOLD INVAKANA ON AM OF TCS      CONTINUE GLUCOPHAGE.   TO CONTROL BLOATING, ABDOMINAL PAIN, AND LOOSE STOOLS:       1. FOLLOW A LOW FODMAP DIET FOR 2 MOS. SEE HANDOUT.      2. TAKE DICYCLOMINE 10 MG TABLETS ONE OR TWO 30 MINUTES PRIOR MEALS UP TO THREE TIMES A DAY AND AT BEDTIME TO CONTROL ABDOMINAL PAN AND DIARRHEA. IT MAY CAUSE DROWSINESS, DRY EYES/MOUTH, BLURRY VISION, OR DIFFICULTY URINATING.   FOLLOW UP IN 6 MOS.   Full Liquid Diet A high-calorie, high-protein supplement should be used to meet your nutritional requirements when the full liquid diet is continued for more than 2 or 3 days. If this diet is to be used for an extended period of time (more than 7 days), a multivitamin should be considered.  Breads and Starches  Allowed: None are allowed   Avoid: Any others.    Potatoes/Pasta/Rice  Allowed: ANY ITEM AS A SOUP OR SMALL PLATE OF MASHED POTATOES OR SCRAMBLED EGGS. (DO NOT EAT MORE THAN ONE SERVING ON THE DAY BEFORE COLONOSCOPY).      Vegetables  Allowed: Strained tomato or vegetable juice. Vegetables pureed in soup.   Avoid: Any others.    Fruit  Allowed: Any strained fruit juices and fruit drinks. Include 1 serving of citrus or vitamin C-enriched fruit juice daily.   Avoid: Any others.  Meat and Meat Substitutes  Allowed: Egg  Avoid: Any meat, fish, or fowl. All cheese.  Milk  Allowed: SOY Milk beverages, including milk shakes and instant breakfast mixes. Smooth yogurt.   Avoid: Any others. Avoid dairy products if not tolerated.    Soups and Combination Foods  Allowed: Broth, strained cream soups. Strained, broth-based soups.   Avoid: Any others.    Desserts and Sweets  Allowed:  flavored gelatin, tapioca, ice cream, sherbet, smooth pudding, junket, fruit ices, frozen ice pops, pudding pops, frozen fudge pops, chocolate syrup. Sugar, honey, jelly, syrup.   Avoid: Any others.  Fats and Oils  Allowed: Margarine, butter, cream, sour cream, oils.   Avoid: Any others.  Beverages  Allowed: All.   Avoid: None.  Condiments  Allowed: Iodized salt, pepper, spices, flavorings. Cocoa powder.   Avoid: Any others.    SAMPLE MEAL PLAN Breakfast   cup orange juice.   1 OR 2 EGGS  1 cup milk.   1 cup beverage (coffee or tea).   Cream or sugar, if desired.    Midmorning Snack  2 SCRAMBLED OR HARD BOILED EGG   Lunch  1 cup cream soup.    cup fruit juice.   1 cup milk.    cup custard.   1 cup beverage (coffee or tea).   Cream or sugar, if desired.    Midafternoon Snack  1 cup milk shake.  Dinner  1 cup cream soup.    cup fruit juice.   1 cup MILK    cup pudding.   1 cup beverage (coffee or tea).   Cream or sugar, if desired.  Evening Snack  1 cup supplement.  To increase calories, add sugar, cream, butter, or margarine  if possible. Nutritional supplements will also increase the total calories.

## 2017-07-01 NOTE — Assessment & Plan Note (Signed)
>   3 SIMPLE ADENOMAS REMOVED IN OCT 2015.   Next colonoscopy FOR CHANGE IN BOWEL HABITS/PERSONAL HISTORY OF POLYPS. TCS with [facility] DUE TO POLYPHARMACY. NEEDS COLON BIOPSY. DISCUSSED PROCEDURE, BENEFITS, & RISKS: < 1% chance of medication reaction, bleeding, perforation, or rupture of spleen/liver.      FULL LIQUID SON DAY BEFORE TCS      HOLD BENTYL ON DAY BEFORE TCS      HOLD INVAKANA ON AM OF TCS      CONTINUE GLUCOPHAGE.  FOLLOW UP IN 6 MOS.

## 2017-07-01 NOTE — Assessment & Plan Note (Addendum)
LIKELY DUE TO IBS-FLARE,. DIFFERENTIAL DIAGNOSIS INCLUDES:  MICROSCOPIC COLITIS. LESS LIKELY CELIAC SPRUE, C DIFF COLITIS, OR IBD.  FOLLOW A LOW FODMAP DIET FOR 2 MOS.  Next colonoscopy FOR CHANGE IN BOWEL HABITS/PERSONAL HISTORY OF POLYPS. TCS with [facility] DUE TO POLYPHARMACY. NEEDS COLON BIOPSY. DISCUSSED PROCEDURE, BENEFITS, & RISKS: < 1% chance of medication reaction, bleeding, perforation, or rupture of spleen/liver.      FULL LIQUID SON DAY BEFORE TCS      HOLD BENTYL ON DAY BEFORE TCS      HOLD INVAKANA ON AM OF TCS      CONTINUE GLUCOPHAGE.  TAKE DICYCLOMINE 10 MG TABLETS ONE OR TWO 30 MINUTES PRIOR MEALS UP TO THREE TIMES A DAY AND AT BEDTIME TO CONTROL ABDOMINAL PAN AND DIARRHEA. IT MAY CAUSE DROWSINESS, DRY EYES/MOUTH, BLURRY VISION, OR DIFFICULTY URINATING.   FOLLOW UP IN 6 MOS.

## 2017-07-01 NOTE — Progress Notes (Signed)
Subjective:    Patient ID: Erica Price, female    DOB: [birth year], 56 y.o.   MRN: [id number]  [name], MD  HPI MOSTLY #4 AND TAKES METFORMIN. NO DOSE CHANGE. Past month change in bowel habits. NOW HAVING MORE #6 AND RARE #4. #7:1-2X/MO? ESPECIALLY IF AFTER SALAD. GETS NAUSEATED AND FEELS BLOATED(MOST DAYS). Bms/day: 2-3 Good day, and may be as bad as 6-7. CAN FEEL PAIN IN LOWER ABDOMEN(CRAMPY, RADIATION ACROSS BOTTOM OF ABDOMEN). STILL TAKING BENTYL BUT NOT EVERY DAY. APPETITE: GOOD. HEARTBURN CONTROLLED WITH PROTONIX EVERY DAY. [place] NOV 5. ON DISABILITY SINCE NOV 2017.  PT DENIES FEVER, CHILLS, HEMATOCHEZIA, HEMATEMESIS, vomiting, melena, CHEST PAIN, SHORTNESS OF BREATH,  CHANGE IN BOWEL IN HABITS, constipation, problems swallowing, problems with sedation, OR heartburn or indigestion.  Past Medical History:  Diagnosis Date  . Apnea    Poss OSA  . Arthralgia   . Arthritis   . Depression   . Fatigue   . Fibromyalgia   . GERD (gastroesophageal reflux disease)   . Myalgia   . Pinched nerve    [id number]  . PONV (postoperative nausea and vomiting)   . Snoring    Past Surgical History:  Procedure Laterality Date  . ARTHROSCOPIC REPAIR ACL Left   . CARPAL TUNNEL RELEASE Right   . CHOLECYSTECTOMY  1990s  . CLUB FOOT RELEASE Left    x3    No Known Allergies  Current Outpatient Medications  Medication Sig Dispense Refill  . canagliflozin (INVOKANA) 300 MG TABS tablet Take 300 mg daily before breakfast by mouth.    . Cinnamon 500 MG capsule Take 500 mg by mouth daily.    Marland Kitchen dicyclomine (BENTYL) 10 MG capsule 1 PO QACHS PRN   . DULoxetine (CYMBALTA) 60 MG capsule Take 60 mg daily by mouth.     . furosemide (LASIX) 20 MG tablet Take 20 mg by mouth daily as needed for fluid.     Marland Kitchen gabapentin (NEURONTIN) 100 MG capsule Take 600 mg 3 (three) times daily by mouth.     Marland Kitchen guaiFENesin-codeine (ROBITUSSIN AC) 100-10 MG/5ML syrup Take 5 mLs 4 (four) times daily as  needed by mouth for cough.    . Ketorolac Tromethamine (TORADOL IJ) Inject as directed every 6 (six) weeks.    Marland Kitchen levofloxacin 500 MG tablet Take 500 mg QD x 10d    . lidocaine (LINDAMANTLE) 3 % CREA cream as needed.     . metFORMIN (GLUCOPHAGE) 500 MG tablet Take 1,000 mg 2 (two) times daily with a meal by mouth.     . pantoprazole (PROTONIX) 40 MG tablet TAKE ONE TABLET BY MOUTH DAILY 30 MINUTES PRIOR TO BREAKFAST.    Marland Kitchen traMADol (ULTRAM) 50 MG tablet Take 50 mg by mouth every 6 (six) hours as needed for moderate pain.     Marland Kitchen zolpidem (AMBIEN) 10 MG tablet Take 10 mg by mouth at bedtime.  RARE   .      Marland Kitchen      .       Review of Systems PER HPI OTHERWISE ALL SYSTEMS ARE NEGATIVE.  NO RECTAL BLEEDING, PRESSURE, OR ITCHING. IF MULTIPLE BOWEL MOVEMENTS BURNING, SWELLING HURTING, AND  SOILING.    Objective:   Physical Exam  Constitutional: She is oriented to person, place, and time. She appears well-developed and well-nourished. No distress.  HENT:  Head: Normocephalic and atraumatic.  Mouth/Throat: Oropharynx is clear and moist. No oropharyngeal exudate.  DENTAL CARIES PRESENT  Eyes: Pupils  are equal, round, and reactive to light. No scleral icterus.  Neck: Normal range of motion. Neck supple.  Cardiovascular: Normal rate, regular rhythm and normal heart sounds.  Pulmonary/Chest: Effort normal and breath sounds normal. No respiratory distress.  Abdominal: Soft. Bowel sounds are normal. She exhibits no distension. There is no tenderness.  Musculoskeletal: She exhibits no edema.  Lymphadenopathy:    She has no cervical adenopathy.  Neurological: She is alert and oriented to person, place, and time.  NO FOCAL DEFICITS  Psychiatric:  FLAT AFFECT, NL MOOD  Vitals reviewed.     Assessment & Plan:

## 2017-07-01 NOTE — Assessment & Plan Note (Signed)
LIKELY DUE TO IBS AND PT BEING OFF BENTYL.   FOLLOW A LOW FODMAP DIET FOR 2 MOS. Next colonoscopy FOR CHANGE IN BOWEL HABITS/PERSONAL HISTORY OF POLYPS. TCS with [facility] DUE TO POLYPHARMACY. NEEDS COLON BIOPSY. DISCUSSED PROCEDURE, BENEFITS, & RISKS: < 1% chance of medication reaction, bleeding, perforation, or rupture of spleen/liver.      FULL LIQUID SON DAY BEFORE TCS      HOLD BENTYL ON DAY BEFORE TCS      HOLD INVAKANA ON AM OF TCS      CONTINUE GLUCOPHAGE.  TAKE DICYCLOMINE 10 MG TABLETS ONE OR TWO 30 MINUTES PRIOR MEALS UP TO THREE TIMES A DAY AND AT BEDTIME TO CONTROL ABDOMINAL PAN AND DIARRHEA. IT MAY CAUSE DROWSINESS, DRY EYES/MOUTH, BLURRY VISION, OR DIFFICULTY URINATING. FOLLOW UP IN 6 MOS.

## 2017-07-01 NOTE — Telephone Encounter (Signed)
Called spoke with pt and is aware pre-op appt scheduled for 07/27/17 at 9:00am. Letter mailed to pt.

## 2017-07-26 NOTE — Patient Instructions (Signed)
Erica Price  07/26/2017     @PREFPERIOPPHARMACY @   Your procedure is scheduled on  08/03/2017 .  Report to [facility] at  615   A.M.  Call this number if you have problems the morning of surgery:  [phone]   Remember:  Do not eat food or drink liquids after midnight.  Take these medicines the morning of surgery with A SIP OF WATER  Cymbalta, gabapentin, protonix, ultram. Use your inhaler before you come and bring any rescue inhalers with you when you come.   Do not wear jewelry, make-up or nail polish.  Do not wear lotions, powders, or perfumes, or deoderant.  Do not shave 48 hours prior to surgery.  Men may shave face and neck.  Do not bring valuables to the hospital.  [facility] is not responsible for any belongings or valuables.  Contacts, dentures or bridgework may not be worn into surgery.  Leave your suitcase in the car.  After surgery it may be brought to your room.  For patients admitted to the hospital, discharge time will be determined by your treatment team.  Patients discharged the day of surgery will not be allowed to drive home.   Name and phone number of your driver:   family Special instructions:  Follow the diet and prep instructions given to you by Dr Nona Dell office.  Please read over the following fact sheets that you were given. Anesthesia Post-op Instructions and Care and Recovery After Surgery       Colonoscopy, Adult A colonoscopy is an exam to look at the entire large intestine. During the exam, a lubricated, bendable tube is inserted into the anus and then passed into the rectum, colon, and other parts of the large intestine. A colonoscopy is often done as a part of normal colorectal screening or in response to certain symptoms, such as anemia, persistent diarrhea, abdominal pain, and blood in the stool. The exam can help screen for and diagnose medical problems, including:  Tumors.  Polyps.  Inflammation.  Areas of  bleeding.  Tell a health care provider about:  Any allergies you have.  All medicines you are taking, including vitamins, herbs, eye drops, creams, and over-the-counter medicines.  Any problems you or family members have had with anesthetic medicines.  Any blood disorders you have.  Any surgeries you have had.  Any medical conditions you have.  Any problems you have had passing stool. What are the risks? Generally, this is a safe procedure. However, problems may occur, including:  Bleeding.  A tear in the intestine.  A reaction to medicines given during the exam.  Infection (rare).  What happens before the procedure? Eating and drinking restrictions Follow instructions from your health care provider about eating and drinking, which may include:  A few days before the procedure - follow a low-fiber diet. Avoid nuts, seeds, dried fruit, raw fruits, and vegetables.  1-3 days before the procedure - follow a clear liquid diet. Drink only clear liquids, such as clear broth or bouillon, black coffee or tea, clear juice, clear soft drinks or sports drinks, gelatin dessert, and popsicles. Avoid any liquids that contain red or purple dye.  On the day of the procedure - do not eat or drink anything during the 2 hours before the procedure, or within the time period that your health care provider recommends.  Bowel prep If you were prescribed an oral bowel prep to clean out  your colon:  Take it as told by your health care provider. Starting the day before your procedure, you will need to drink a large amount of medicated liquid. The liquid will cause you to have multiple loose stools until your stool is almost clear or light green.  If your skin or anus gets irritated from diarrhea, you may use these to relieve the irritation: ? Medicated wipes, such as adult wet wipes with aloe and vitamin E. ? A skin soothing-product like petroleum jelly.  If you vomit while drinking the bowel  prep, take a break for up to 60 minutes and then begin the bowel prep again. If vomiting continues and you cannot take the bowel prep without vomiting, call your health care provider.  General instructions  Ask your health care provider about changing or stopping your regular medicines. This is especially important if you are taking diabetes medicines or blood thinners.  Plan to have someone take you home from the hospital or clinic. What happens during the procedure?  An IV tube may be inserted into one of your veins.  You will be given medicine to help you relax (sedative).  To reduce your risk of infection: ? Your health care team will wash or sanitize their hands. ? Your anal area will be washed with soap.  You will be asked to lie on your side with your knees bent.  Your health care provider will lubricate a long, thin, flexible tube. The tube will have a camera and a light on the end.  The tube will be inserted into your anus.  The tube will be gently eased through your rectum and colon.  Air will be delivered into your colon to keep it open. You may feel some pressure or cramping.  The camera will be used to take images during the procedure.  A small tissue sample may be removed from your body to be examined under a microscope (biopsy). If any potential problems are found, the tissue will be sent to a lab for testing.  If small polyps are found, your health care provider may remove them and have them checked for cancer cells.  The tube that was inserted into your anus will be slowly removed. The procedure may vary among health care providers and hospitals. What happens after the procedure?  Your blood pressure, heart rate, breathing rate, and blood oxygen level will be monitored until the medicines you were given have worn off.  Do not drive for 24 hours after the exam.  You may have a small amount of blood in your stool.  You may pass gas and have mild abdominal  cramping or bloating due to the air that was used to inflate your colon during the exam.  It is up to you to get the results of your procedure. Ask your health care provider, or the department performing the procedure, when your results will be ready. This information is not intended to replace advice given to you by your health care provider. Make sure you discuss any questions you have with your health care provider. Document Released: 08/07/2000 Document Revised: 06/10/2016 Document Reviewed: 10/22/2015 Elsevier Interactive Patient Education  2018 [employer].  Colonoscopy, Adult, Care After This sheet gives you information about how to care for yourself after your procedure. Your health care provider may also give you more specific instructions. If you have problems or questions, contact your health care provider. What can I expect after the procedure? After the procedure, it is common  to have:  A small amount of blood in your stool for 24 hours after the procedure.  Some gas.  Mild abdominal cramping or bloating.  Follow these instructions at home: General instructions   For the first 24 hours after the procedure: ? Do not drive or use machinery. ? Do not sign important documents. ? Do not drink alcohol. ? Do your regular daily activities at a slower pace than normal. ? Eat soft, easy-to-digest foods. ? Rest often.  Take over-the-counter or prescription medicines only as told by your health care provider.  It is up to you to get the results of your procedure. Ask your health care provider, or the department performing the procedure, when your results will be ready. Relieving cramping and bloating  Try walking around when you have cramps or feel bloated.  Apply heat to your abdomen as told by your health care provider. Use a heat source that your health care provider recommends, such as a moist heat pack or a heating pad. ? Place a towel between your skin and the heat  source. ? Leave the heat on for 20-30 minutes. ? Remove the heat if your skin turns bright red. This is especially important if you are unable to feel pain, heat, or cold. You may have a greater risk of getting burned. Eating and drinking  Drink enough fluid to keep your urine clear or pale yellow.  Resume your normal diet as instructed by your health care provider. Avoid heavy or fried foods that are hard to digest.  Avoid drinking alcohol for as long as instructed by your health care provider. Contact a health care provider if:  You have blood in your stool 2-3 days after the procedure. Get help right away if:  You have more than a small spotting of blood in your stool.  You pass large blood clots in your stool.  Your abdomen is swollen.  You have nausea or vomiting.  You have a fever.  You have increasing abdominal pain that is not relieved with medicine. This information is not intended to replace advice given to you by your health care provider. Make sure you discuss any questions you have with your health care provider. Document Released: 03/24/2004 Document Revised: 05/04/2016 Document Reviewed: 10/22/2015 Elsevier Interactive Patient Education  2018 [place] Anesthesia is a term that refers to techniques, procedures, and medicines that help a person stay safe and comfortable during a medical procedure. Monitored anesthesia care, or sedation, is one type of anesthesia. Your anesthesia specialist may recommend sedation if you will be having a procedure that does not require you to be unconscious, such as:  Cataract surgery.  A dental procedure.  A biopsy.  A colonoscopy.  During the procedure, you may receive a medicine to help you relax (sedative). There are three levels of sedation:  Mild sedation. At this level, you may feel awake and relaxed. You will be able to follow directions.  Moderate sedation. At this level, you will be  sleepy. You may not remember the procedure.  Deep sedation. At this level, you will be asleep. You will not remember the procedure.  The more medicine you are given, the deeper your level of sedation will be. Depending on how you respond to the procedure, the anesthesia specialist may change your level of sedation or the type of anesthesia to fit your needs. An anesthesia specialist will monitor you closely during the procedure. Let your health care provider know about:  Any allergies you have.  All medicines you are taking, including vitamins, herbs, eye drops, creams, and over-the-counter medicines.  Any use of steroids (by mouth or as a cream).  Any problems you or family members have had with sedatives and anesthetic medicines.  Any blood disorders you have.  Any surgeries you have had.  Any medical conditions you have, such as sleep apnea.  Whether you are pregnant or may be pregnant.  Any use of cigarettes, alcohol, or street drugs. What are the risks? Generally, this is a safe procedure. However, problems may occur, including:  Getting too much medicine (oversedation).  Nausea.  Allergic reaction to medicines.  Trouble breathing. If this happens, a breathing tube may be used to help with breathing. It will be removed when you are awake and breathing on your own.  Heart trouble.  Lung trouble.  Before the procedure Staying hydrated Follow instructions from your health care provider about hydration, which may include:  Up to 2 hours before the procedure - you may continue to drink clear liquids, such as water, clear fruit juice, black coffee, and plain tea.  Eating and drinking restrictions Follow instructions from your health care provider about eating and drinking, which may include:  8 hours before the procedure - stop eating heavy meals or foods such as meat, fried foods, or fatty foods.  6 hours before the procedure - stop eating light meals or foods, such  as toast or cereal.  6 hours before the procedure - stop drinking milk or drinks that contain milk.  2 hours before the procedure - stop drinking clear liquids.  Medicines Ask your health care provider about:  Changing or stopping your regular medicines. This is especially important if you are taking diabetes medicines or blood thinners.  Taking medicines such as aspirin and ibuprofen. These medicines can thin your blood. Do not take these medicines before your procedure if your health care provider instructs you not to.  Tests and exams  You will have a physical exam.  You may have blood tests done to show: ? How well your kidneys and liver are working. ? How well your blood can clot.  General instructions  Plan to have someone take you home from the hospital or clinic.  If you will be going home right after the procedure, plan to have someone with you for 24 hours.  What happens during the procedure?  Your blood pressure, heart rate, breathing, level of pain and overall condition will be monitored.  An IV tube will be inserted into one of your veins.  Your anesthesia specialist will give you medicines as needed to keep you comfortable during the procedure. This may mean changing the level of sedation.  The procedure will be performed. After the procedure  Your blood pressure, heart rate, breathing rate, and blood oxygen level will be monitored until the medicines you were given have worn off.  Do not drive for 24 hours if you received a sedative.  You may: ? Feel sleepy, clumsy, or nauseous. ? Feel forgetful about what happened after the procedure. ? Have a sore throat if you had a breathing tube during the procedure. ? Vomit. This information is not intended to replace advice given to you by your health care provider. Make sure you discuss any questions you have with your health care provider. Document Released: 05/06/2005 Document Revised: 01/17/2016 Document  Reviewed: 12/01/2015 Elsevier Interactive Patient Education  2018 [place], Care After These instructions  provide you with information about caring for yourself after your procedure. Your health care provider may also give you more specific instructions. Your treatment has been planned according to current medical practices, but problems sometimes occur. Call your health care provider if you have any problems or questions after your procedure. What can I expect after the procedure? After your procedure, it is common to:  Feel sleepy for several hours.  Feel clumsy and have poor balance for several hours.  Feel forgetful about what happened after the procedure.  Have poor judgment for several hours.  Feel nauseous or vomit.  Have a sore throat if you had a breathing tube during the procedure.  Follow these instructions at home: For at least 24 hours after the procedure:   Do not: ? Participate in activities in which you could fall or become injured. ? Drive. ? Use heavy machinery. ? Drink alcohol. ? Take sleeping pills or medicines that cause drowsiness. ? Make important decisions or sign legal documents. ? Take care of children on your own.  Rest. Eating and drinking  Follow the diet that is recommended by your health care provider.  If you vomit, drink water, juice, or soup when you can drink without vomiting.  Make sure you have little or no nausea before eating solid foods. General instructions  Have a responsible adult stay with you until you are awake and alert.  Take over-the-counter and prescription medicines only as told by your health care provider.  If you smoke, do not smoke without supervision.  Keep all follow-up visits as told by your health care provider. This is important. Contact a health care provider if:  You keep feeling nauseous or you keep vomiting.  You feel light-headed.  You develop a rash.  You have a  fever. Get help right away if:  You have trouble breathing. This information is not intended to replace advice given to you by your health care provider. Make sure you discuss any questions you have with your health care provider. Document Released: 12/01/2015 Document Revised: 04/01/2016 Document Reviewed: 12/01/2015 Elsevier Interactive Patient Education  [employer].

## 2017-07-27 ENCOUNTER — Encounter (HOSPITAL_COMMUNITY)
Admission: RE | Admit: 2017-07-27 | Discharge: 2017-07-27 | Disposition: A | Discharge status: Home / Self Care | Payer: Medicare Other | Source: Ambulatory Visit | Attending: Gastroenterology | Admitting: Gastroenterology

## 2017-07-27 ENCOUNTER — Telehealth: Payer: Self-pay | Admitting: Gastroenterology

## 2017-07-27 ENCOUNTER — Encounter (HOSPITAL_COMMUNITY): Payer: Self-pay

## 2017-07-27 ENCOUNTER — Other Ambulatory Visit: Payer: Self-pay

## 2017-07-27 DIAGNOSIS — D72829 Elevated white blood cell count, unspecified: Secondary | ICD-10-CM

## 2017-07-27 DIAGNOSIS — Z01818 Encounter for other preprocedural examination: Secondary | ICD-10-CM | POA: Insufficient documentation

## 2017-07-27 LAB — CBC WITH DIFFERENTIAL/PLATELET
BASOS PCT: 1 %
Basophils Absolute: 0.1 10*3/uL (ref 0.0–0.1)
Eosinophils Absolute: 0.2 10*3/uL (ref 0.0–0.7)
Eosinophils Relative: 2 %
HEMATOCRIT: 45.5 % (ref 36.0–46.0)
HEMOGLOBIN: 14.2 g/dL (ref 12.0–15.0)
Lymphocytes Relative: 26 %
Lymphs Abs: 3.1 10*3/uL (ref 0.7–4.0)
MCH: 28.6 pg (ref 26.0–34.0)
MCHC: 31.2 g/dL (ref 30.0–36.0)
MCV: 91.5 fL (ref 78.0–100.0)
MONOS PCT: 6 %
Monocytes Absolute: 0.7 10*3/uL (ref 0.1–1.0)
NEUTROS ABS: 8 10*3/uL — AB (ref 1.7–7.7)
NEUTROS PCT: 65 %
PLATELETS: 358 10*3/uL (ref 150–400)
RBC: 4.97 MIL/uL (ref 3.87–5.11)
RDW: 15.1 % (ref 11.5–15.5)
WBC: 12 10*3/uL — AB (ref 4.0–10.5)

## 2017-07-27 LAB — BASIC METABOLIC PANEL
Anion gap: 9 (ref 5–15)
BUN: 17 mg/dL (ref 6–20)
CALCIUM: 9.1 mg/dL (ref 8.9–10.3)
CO2: 23 mmol/L (ref 22–32)
CREATININE: 1.01 mg/dL — AB (ref 0.44–1.00)
Chloride: 103 mmol/L (ref 101–111)
GFR calc Af Amer: >60 mL/min (ref >60)
GLUCOSE: 185 mg/dL — AB (ref 65–99)
Potassium: 4.5 mmol/L (ref 3.5–5.1)
SODIUM: 135 mmol/L (ref 135–145)

## 2017-07-27 NOTE — Telephone Encounter (Signed)
PT is aware of Results and that she had elevated WBC and will need to repeat CBC in one month.  Lab order on file.

## 2017-07-27 NOTE — Telephone Encounter (Signed)
Per [facility] repeat CBC in one month

## 2017-08-04 ENCOUNTER — Telehealth: Payer: Self-pay

## 2017-08-04 NOTE — Telephone Encounter (Signed)
Called pt to reschedule TCS w/Propofol w/SLF that was not done yesterday d/t inclement weather. Procedure rescheduled to 09/14/17 at 7:30am. New instructions mailed to pt. Called and informed Endo scheduler. New pre-op appt scheduled for 09/09/17 at 10:00am. Called pt back and informed her.   She is aware that she will need repeat CBC one month from 07/27/17 (WBC was elevated on last pre-op labs). CBC order mailed to her.

## 2017-09-01 ENCOUNTER — Other Ambulatory Visit: Payer: Self-pay

## 2017-09-01 DIAGNOSIS — D72829 Elevated white blood cell count, unspecified: Secondary | ICD-10-CM

## 2017-09-01 LAB — CBC WITH DIFFERENTIAL/PLATELET
Basophils Absolute: 156 cells/uL (ref 0–200)
Basophils Relative: 1.2 %
EOS ABS: 156 {cells}/uL (ref 15–500)
Eosinophils Relative: 1.2 %
HEMATOCRIT: 44.1 % (ref 35.0–45.0)
Hemoglobin: 15.1 g/dL (ref 11.7–15.5)
LYMPHS ABS: 3172 {cells}/uL (ref 850–3900)
MCH: 28.8 pg (ref 27.0–33.0)
MCHC: 34.2 g/dL (ref 32.0–36.0)
MCV: 84.2 fL (ref 80.0–100.0)
MPV: 8.8 fL (ref 7.5–12.5)
Monocytes Relative: 7.3 %
NEUTROS PCT: 65.9 %
Neutro Abs: 8567 cells/uL — ABNORMAL HIGH (ref 1500–7800)
Platelets: 448 10*3/uL — ABNORMAL HIGH (ref 140–400)
RBC: 5.24 10*6/uL — AB (ref 3.80–5.10)
RDW: 14.4 % (ref 11.0–15.0)
TOTAL LYMPHOCYTE: 24.4 %
WBC: 13 10*3/uL — AB (ref 3.8–10.8)
WBCMIX: 949 {cells}/uL (ref 200–950)

## 2017-09-06 NOTE — Progress Notes (Signed)
CC'D TO PCP °

## 2017-09-09 ENCOUNTER — Encounter (HOSPITAL_COMMUNITY): Payer: Self-pay

## 2017-09-09 ENCOUNTER — Encounter (HOSPITAL_COMMUNITY)
Admission: RE | Admit: 2017-09-09 | Discharge: 2017-09-09 | Disposition: A | Discharge status: Home / Self Care | Payer: Medicare Other | Source: Ambulatory Visit | Attending: Gastroenterology | Admitting: Gastroenterology

## 2017-09-14 ENCOUNTER — Encounter (HOSPITAL_COMMUNITY): Payer: Self-pay | Admitting: *Deleted

## 2017-09-14 ENCOUNTER — Encounter (HOSPITAL_COMMUNITY)
Admission: RE | Disposition: A | Discharge status: Home / Self Care | Payer: Self-pay | Source: Ambulatory Visit | Attending: Gastroenterology

## 2017-09-14 ENCOUNTER — Ambulatory Visit (HOSPITAL_COMMUNITY): Payer: Medicare Other | Admitting: Anesthesiology

## 2017-09-14 ENCOUNTER — Ambulatory Visit (HOSPITAL_COMMUNITY)
Admission: RE | Admit: 2017-09-14 | Discharge: 2017-09-14 | Disposition: A | Discharge status: Home / Self Care | Payer: Medicare Other | Source: Ambulatory Visit | Attending: Gastroenterology | Admitting: Gastroenterology

## 2017-09-14 DIAGNOSIS — Z87891 Personal history of nicotine dependence: Secondary | ICD-10-CM | POA: Insufficient documentation

## 2017-09-14 DIAGNOSIS — R194 Change in bowel habit: Secondary | ICD-10-CM | POA: Diagnosis not present

## 2017-09-14 DIAGNOSIS — D120 Benign neoplasm of cecum: Secondary | ICD-10-CM | POA: Insufficient documentation

## 2017-09-14 DIAGNOSIS — Z7984 Long term (current) use of oral hypoglycemic drugs: Secondary | ICD-10-CM | POA: Insufficient documentation

## 2017-09-14 DIAGNOSIS — Z8249 Family history of ischemic heart disease and other diseases of the circulatory system: Secondary | ICD-10-CM | POA: Diagnosis not present

## 2017-09-14 DIAGNOSIS — D123 Benign neoplasm of transverse colon: Secondary | ICD-10-CM | POA: Insufficient documentation

## 2017-09-14 DIAGNOSIS — Z86010 Personal history of colonic polyps: Secondary | ICD-10-CM | POA: Insufficient documentation

## 2017-09-14 DIAGNOSIS — K219 Gastro-esophageal reflux disease without esophagitis: Secondary | ICD-10-CM | POA: Insufficient documentation

## 2017-09-14 DIAGNOSIS — Q438 Other specified congenital malformations of intestine: Secondary | ICD-10-CM | POA: Insufficient documentation

## 2017-09-14 DIAGNOSIS — R5383 Other fatigue: Secondary | ICD-10-CM | POA: Insufficient documentation

## 2017-09-14 DIAGNOSIS — M1990 Unspecified osteoarthritis, unspecified site: Secondary | ICD-10-CM | POA: Diagnosis not present

## 2017-09-14 DIAGNOSIS — Z8051 Family history of malignant neoplasm of kidney: Secondary | ICD-10-CM | POA: Insufficient documentation

## 2017-09-14 DIAGNOSIS — Z7951 Long term (current) use of inhaled steroids: Secondary | ICD-10-CM | POA: Insufficient documentation

## 2017-09-14 DIAGNOSIS — R0683 Snoring: Secondary | ICD-10-CM | POA: Insufficient documentation

## 2017-09-14 DIAGNOSIS — K644 Residual hemorrhoidal skin tags: Secondary | ICD-10-CM | POA: Diagnosis not present

## 2017-09-14 DIAGNOSIS — M797 Fibromyalgia: Secondary | ICD-10-CM | POA: Insufficient documentation

## 2017-09-14 DIAGNOSIS — Z9049 Acquired absence of other specified parts of digestive tract: Secondary | ICD-10-CM | POA: Insufficient documentation

## 2017-09-14 DIAGNOSIS — Z79899 Other long term (current) drug therapy: Secondary | ICD-10-CM | POA: Diagnosis not present

## 2017-09-14 DIAGNOSIS — K648 Other hemorrhoids: Secondary | ICD-10-CM | POA: Insufficient documentation

## 2017-09-14 DIAGNOSIS — M2550 Pain in unspecified joint: Secondary | ICD-10-CM | POA: Diagnosis not present

## 2017-09-14 DIAGNOSIS — F329 Major depressive disorder, single episode, unspecified: Secondary | ICD-10-CM | POA: Insufficient documentation

## 2017-09-14 DIAGNOSIS — R0681 Apnea, not elsewhere classified: Secondary | ICD-10-CM | POA: Insufficient documentation

## 2017-09-14 DIAGNOSIS — Z803 Family history of malignant neoplasm of breast: Secondary | ICD-10-CM | POA: Insufficient documentation

## 2017-09-14 DIAGNOSIS — K5730 Diverticulosis of large intestine without perforation or abscess without bleeding: Secondary | ICD-10-CM | POA: Insufficient documentation

## 2017-09-14 HISTORY — PX: COLONOSCOPY WITH PROPOFOL: SHX5780

## 2017-09-14 HISTORY — PX: BIOPSY: SHX5522

## 2017-09-14 HISTORY — PX: POLYPECTOMY: SHX5525

## 2017-09-14 LAB — GLUCOSE, CAPILLARY
Glucose-Capillary: 167 mg/dL — ABNORMAL HIGH (ref 65–99)
Glucose-Capillary: 159 mg/dL — ABNORMAL HIGH (ref 65–99)

## 2017-09-14 SURGERY — COLONOSCOPY WITH PROPOFOL
Anesthesia: Monitor Anesthesia Care

## 2017-09-14 MED ORDER — PROPOFOL 10 MG/ML IV BOLUS
INTRAVENOUS | Status: DC | PRN
  Administered 2017-09-14: 60 mg via INTRAVENOUS

## 2017-09-14 MED ORDER — PROPOFOL 500 MG/50ML IV EMUL
INTRAVENOUS | Status: DC | PRN
  Administered 2017-09-14: 100 ug/kg/min via INTRAVENOUS

## 2017-09-14 MED ORDER — CHLORHEXIDINE GLUCONATE CLOTH 2 % EX PADS: 6.0000 | MEDICATED_PAD | Freq: Once | CUTANEOUS | Status: DC

## 2017-09-14 MED ORDER — MIDAZOLAM HCL 2 MG/2ML IJ SOLN
INTRAMUSCULAR | Status: AC
Start: 2017-09-14 — End: 2017-09-14
  Filled 2017-09-14: qty 2

## 2017-09-14 MED ORDER — LACTATED RINGERS IV SOLN
INTRAVENOUS | Status: DC
  Administered 2017-09-14: 1000 mL via INTRAVENOUS

## 2017-09-14 MED ORDER — PROPOFOL 10 MG/ML IV BOLUS
INTRAVENOUS | Status: AC
  Filled 2017-09-14: qty 20

## 2017-09-14 MED ORDER — FENTANYL CITRATE (PF) 100 MCG/2ML IJ SOLN
25.0000 ug | Freq: Once | INTRAMUSCULAR | Status: AC
  Administered 2017-09-14: 25 ug via INTRAVENOUS

## 2017-09-14 MED ORDER — FENTANYL CITRATE (PF) 100 MCG/2ML IJ SOLN
INTRAMUSCULAR | Status: AC
  Filled 2017-09-14: qty 2

## 2017-09-14 MED ORDER — MIDAZOLAM HCL 2 MG/2ML IJ SOLN
1.0000 mg | INTRAMUSCULAR | Status: AC
  Administered 2017-09-14: 2 mg via INTRAVENOUS

## 2017-09-14 NOTE — Discharge Instructions (Signed)
YOUR LOOSE STOOLS ARE MOST LIKELY DUE TO medications(GLUCOPHAGE?) or diabetes. You had 3 polyps removed. YOU HAVE small EXTERNAL and internal HEMORRHOIDS AND DIVERTICULOSIS IN YOUR EFT COLON.    DRINK WATER TO KEEP YOUR URINE LIGHT YELLOW.  FOLLOW A DAIRY FREE/HIGH FIBER/DIABETIC DIET. AVOID ITEMS THAT CAUSE BLOATING AND GAS. SEE INFO BELOW. Reading food labels is important. Many products contain lactose even when they are not made from milk. Look for the following words: whey, milk solids, dry milk solids, nonfat dry milk powder. Typical sources of lactose other than dairy products include breads, candies, cold cuts, prepared and processed foods, and commercial sauces and gravies.   CONTINUE YOUR WEIGHT LOSS EFFORTS.  WHILE I DO NOT WANT TO ALARM YOU, YOUR BODY MASS INDEX IS OVER 30 WHICH MEANS YOU ARE OBESE. Obesity activates cancer genes. OBESITY IS ASSOCIATED WITH AN INCREASED RISK FOR CIRRHOSIS AND ALL CANCERS, INCLUDING ESOPHAGEAL AND COLON CANCER. A WEIGHT OF  160 LBS  WILL GET YOUR BODY MASS INDEX(BMI) UNDER 30.   Consider A PLANT BASED DIET-NO MEAT OR DAIRY for 6 MOS. AVOID ITEMS THAT CAUSE BLOATING & GAS. I RECOMMEND THE BOOK, "PREVENT AND REVERSE HEART DISEASE", CALDWELL ESSELSTYN JR., MD. PAGES [phone] [place] THE DIET AND THE LAST HALF OF THE BOOK HAS QUICK AND EASY RECIPES FOR BREAKFAST, LUNCH, AND DINNER ARE AFTER P 127.    TAKE DICYCLOMINE 30 MINUTES PRIOR TO MEALS AND AT BEDTIME. IT MAY CAUSE DROWSINESS, DRY EYES/MOUTH, BLURRY VISION, OR DIFFICULTY URINATING.  USE PREPARATION H FOUR TIMES  A DAY IF NEEDED TO RELIEVE RECTAL PAIN/PRESSURE/BLEEDING.  YOUR BIOPSY RESULTS WILL BE AVAILABLE IN MY CHART AFTER JAN 25 AND MY OFFICE WILL CONTACT YOU IN 10-14 DAYS WITH YOUR RESULTS.   FOLLOW UP IN 4 MOS.  Next colonoscopy IN 3 YEARS.  Colonoscopy Care After Read the instructions outlined below and refer to this sheet in the next week. These discharge instructions  provide you with general information on caring for yourself after you leave the hospital. While your treatment has been planned according to the most current medical practices available, unavoidable complications occasionally occur. If you have any problems or questions after discharge, call DR. Blayton Huttner, [phone].  ACTIVITY  You may resume your regular activity, but move at a slower pace for the next 24 hours.   Take frequent rest periods for the next 24 hours.   Walking will help get rid of the air and reduce the bloated feeling in your belly (abdomen).   No driving for 24 hours (because of the medicine (anesthesia) used during the test).   You may shower.   Do not sign any important legal documents or operate any machinery for 24 hours (because of the anesthesia used during the test).    NUTRITION  Drink plenty of fluids.   You may resume your normal diet as instructed by your doctor.   Begin with a light meal and progress to your normal diet. Heavy or fried foods are harder to digest and may make you feel sick to your stomach (nauseated).   Avoid alcoholic beverages for 24 hours or as instructed.    MEDICATIONS  You may resume your normal medications.   WHAT YOU CAN EXPECT TODAY  Some feelings of bloating in the abdomen.   Passage of more gas than usual.   Spotting of blood in your stool or on the toilet paper  .  IF YOU HAD POLYPS REMOVED DURING THE COLONOSCOPY:  Eat a soft diet IF YOU HAVE NAUSEA, BLOATING, ABDOMINAL PAIN, OR VOMITING.    FINDING OUT THE RESULTS OF YOUR TEST Not all test results are available during your visit. DR. Oneida Alar WILL CALL YOU WITHIN 14 DAYS OF YOUR PROCEDUE WITH YOUR RESULTS. Do not assume everything is normal if you have not heard from DR. Yeng Frankie, CALL HER OFFICE AT [phone].  SEEK IMMEDIATE MEDICAL ATTENTION AND CALL THE OFFICE: [phone] IF:  You have more than a spotting of blood in your stool.   Your belly is swollen  (abdominal distention).   You are nauseated or vomiting.   You have a temperature over 101F.   You have abdominal pain or discomfort that is severe or gets worse throughout the day.   Lactose Free Diet Lactose is a carbohydrate that is found mainly in milk and milk products, as well as in foods with added milk or whey. Lactose must be digested by the enzyme in order to be used by the body. Lactose intolerance occurs when there is a shortage of lactase. When your body is not able to digest lactose, you may feel sick to your stomach (nausea), bloating, cramping, gas and diarrhea.  There are many dairy products that may be tolerated better than milk by some people:  The use of cultured dairy products such as yogurt, buttermilk, cottage cheese, and sweet acidophilus milk (Kefir) for lactase-deficient individuals is usually well tolerated. This is because the healthy bacteria help digest lactose.   Lactose-hydrolyzed milk (Lactaid) contains 40-90% less lactose than milk and may also be well tolerated.    SPECIAL NOTES  Lactose is a carbohydrates. The major food source is dairy products. Reading food labels is important. Many products contain lactose even when they are not made from milk. Look for the following words: whey, milk solids, dry milk solids, nonfat dry milk powder. Typical sources of lactose other than dairy products include breads, candies, cold cuts, prepared and processed foods, and commercial sauces and gravies.   All foods must be prepared without milk, cream, or other dairy foods.   Soy milk and lactose-free supplements (LACTASE) may be used as an alternative to milk.   FOOD GROUP ALLOWED/RECOMMENDED AVOID/USE SPARINGLY  BREADS / STARCHES 4 servings or more* Breads and rolls made without milk. [place], [place], or [place] bread. Breads and rolls that contain milk. Prepared mixes such as muffins, biscuits, waffles, pancakes. Sweet rolls, donuts, [place] toast (if made with milk  or lactose).  Crackers: Soda crackers, graham crackers. Any crackers prepared without lactose. Zwieback crackers, corn curls, or any that contain lactose.  Cereals: Cooked or dry cereals prepared without lactose (read labels). Cooked or dry cereals prepared with lactose (read labels). Total, Cocoa Krispies. Special K.  Potatoes / Pasta / Rice: Any prepared without milk or lactose. Popcorn. Instant potatoes, frozen [place] fries, scalloped or au gratin potatoes.  VEGETABLES 2 servings or more Fresh, frozen, and canned vegetables. Creamed or breaded vegetables. Vegetables in a cheese sauce or with lactose-containing margarines.  FRUIT 2 servings or more All fresh, canned, or frozen fruits that are not processed with lactose. Any canned or frozen fruits processed with lactose.  MEAT & SUBSTITUTES 2 servings or more (4 to 6 oz. total per day) Plain beef, chicken, fish, [place], lamb, veal, pork, or ham. Kosher prepared meat products. Strained or junior meats that do not contain milk. Eggs, soy meat substitutes, nuts. Scrambled eggs, omelets, and souffles that contain milk. Creamed or breaded meat, fish, or fowl.  Sausage products such as wieners, liver sausage, or cold cuts that contain milk solids. Cheese, cottage cheese, or cheese spreads.  MILK None. (See BEVERAGES for milk substitutes. See DESSERTS for ice cream and frozen desserts.) Milk (whole, 2%, skim, or chocolate). Evaporated, powdered, or condensed milk; malted milk.  SOUPS & COMBINATION FOODS Bouillon, broth, vegetable soups, clear soups, consomms. Homemade soups made with allowed ingredients. Combination or prepared foods that do not contain milk or milk products (read labels). Cream soups, chowders, commercially prepared soups containing lactose. Macaroni and cheese, pizza. Combination or prepared foods that contain milk or milk products.  DESSERTS & SWEETS In moderation Water and fruit ices; gelatin; angel food cake. Homemade cookies,  pies, or cakes made from allowed ingredients. Pudding (if made with water or a milk substitute). Lactose-free tofu desserts. Sugar, honey, corn syrup, jam, jelly; marmalade; molasses (beet sugar); Pure sugar candy; marshmallows. Ice cream, ice milk, sherbet, custard, pudding, frozen yogurt. Commercial cake and cookie mixes. Desserts that contain chocolate. Pie crust made with milk-containing margarine; reduced-calorie desserts made with a sugar substitute that contains lactose. Toffee, peppermint, butterscotch, chocolate, caramels.  FATS & OILS In moderation Butter (as tolerated; contains very small amounts of lactose). Margarines and dressings that do not contain milk, Vegetable oils, shortening, Miracle Whip, mayonnaise, nondairy cream & whipped toppings without lactose or milk solids added (examples: Coffee Rich, Carnation Coffeemate, Rich's Whipped Topping, PolyRich). Berniece Salines. Margarines and salad dressings containing milk; cream, cream cheese; peanut butter with added milk solids, sour cream, chip dips, made with sour cream.  BEVERAGES Carbonated drinks; tea; coffee and freeze-dried coffee; some instant coffees (check labels). Fruit drinks; fruit and vegetable juice; Rice or Soy milk. Ovaltine, hot chocolate. Some cocoas; some instant coffees; instant iced teas; powdered fruit drinks (read labels).   CONDIMENTS / MISCELLANEOUS Soy sauce, carob powder, olives, gravy made with water, baker's cocoa, pickles, pure seasonings and spices, wine, pure monosodium glutamate, catsup, mustard. Some chewing gums, chocolate, some cocoas. Certain antibiotics and vitamin / mineral preparations. Spice blends if they contain milk products. MSG extender. Artificial sweeteners that contain lactose such as Equal (Nutra-Sweet) and Sweet 'n Low. Some nondairy creamers (read labels).   SAMPLE MENU*  Breakfast   Orange Juice.  Banana.   Bran flakes.   Nondairy Creamer.  [place] Bread (toasted).   Butter or  milk-free margarine.   Coffee or tea.    Noon Meal   Chicken Breast.  Rice.   Green beans.   Butter or milk-free margarine.  Fresh melon.   Coffee or tea.    Evening Meal   Roast Beef.  Baked potato.   Butter or milk-free margarine.   Broccoli.   Lettuce salad with vinegar and oil dressing.  [employer]   Coffee or tea.      High-Fiber Diet A high-fiber diet changes your normal diet to include more whole grains, legumes, fruits, and vegetables. Changes in the diet involve replacing refined carbohydrates with unrefined foods. The calorie level of the diet is essentially unchanged. The Dietary Reference Intake (recommended amount) for adult males is 38 grams per day. For adult females, it is 25 grams per day. Pregnant and lactating women should consume 28 grams of fiber per day. Fiber is the intact part of a plant that is not broken down during digestion. Functional fiber is fiber that has been isolated from the plant to provide a beneficial effect in the body. PURPOSE  Increase stool bulk.   Ease and regulate bowel movements.  Lower cholesterol.   REDUCE RISK OF COLON CANCER  INDICATIONS THAT YOU NEED MORE FIBER  Constipation and hemorrhoids.   Uncomplicated diverticulosis (intestine condition) and irritable bowel syndrome.   Weight management.   As a protective measure against hardening of the arteries (atherosclerosis), diabetes, and cancer.   GUIDELINES FOR INCREASING FIBER IN THE DIET  Start adding fiber to the diet slowly. A gradual increase of about 5 more grams (2 slices of whole-wheat bread, 2 servings of most fruits or vegetables, or 1 bowl of high-fiber cereal) per day is best. Too rapid an increase in fiber may result in constipation, flatulence, and bloating.   Drink enough water and fluids to keep your urine clear or pale yellow. Water, juice, or caffeine-free drinks are recommended. Not drinking enough fluid may cause constipation.    Eat a variety of high-fiber foods rather than one type of fiber.   Try to increase your intake of fiber through using high-fiber foods rather than fiber pills or supplements that contain small amounts of fiber.   The goal is to change the types of food eaten. Do not supplement your present diet with high-fiber foods, but replace foods in your present diet.   INCLUDE A VARIETY OF FIBER SOURCES  Replace refined and processed grains with whole grains, canned fruits with fresh fruits, and incorporate other fiber sources. White rice, white breads, and most bakery goods contain little or no fiber.   Brown whole-grain rice, buckwheat oats, and many fruits and vegetables are all good sources of fiber. These include: broccoli, Brussels sprouts, cabbage, cauliflower, beets, sweet potatoes, white potatoes (skin on), carrots, tomatoes, eggplant, squash, berries, fresh fruits, and dried fruits.   Cereals appear to be the richest source of fiber. Cereal fiber is found in whole grains and bran. Bran is the fiber-rich outer coat of cereal grain, which is largely removed in refining. In whole-grain cereals, the bran remains. In breakfast cereals, the largest amount of fiber is found in those with "bran" in their names. The fiber content is sometimes indicated on the label.   You may need to include additional fruits and vegetables each day.   In baking, for 1 cup white flour, you may use the following substitutions:   1 cup whole-wheat flour minus 2 tablespoons.   1/2 cup white flour plus 1/2 cup whole-wheat flour.   Polyps, Colon  A polyp is extra tissue that grows inside your body. Colon polyps grow in the large intestine. The large intestine, also called the colon, is part of your digestive system. It is a long, hollow tube at the end of your digestive tract where your body makes and stores stool. Most polyps are not dangerous. They are benign. This means they are not cancerous. But over time, some  types of polyps can turn into cancer. Polyps that are smaller than a pea are usually not harmful. But larger polyps could someday become or may already be cancerous. To be safe, doctors remove all polyps and test them.   PREVENTION There is not one sure way to prevent polyps. You might be able to lower your risk of getting them if you:  Eat more fruits and vegetables and less fatty food.   Do not smoke.   Avoid alcohol.   Exercise every day.   Lose weight if you are overweight.   Eating more calcium and folate can also lower your risk of getting polyps. Some foods that are rich in calcium are milk, cheese, and broccoli.  Some foods that are rich in folate are chickpeas, kidney beans, and spinach.     Hemorrhoids Hemorrhoids are dilated (enlarged) veins around the rectum. Sometimes clots will form in the veins. This makes them swollen and painful. These are called thrombosed hemorrhoids. Causes of hemorrhoids include:  Constipation.   Straining to have a bowel movement.   HEAVY LIFTING  HOME CARE INSTRUCTIONS  Eat a well balanced diet and drink 6 to 8 glasses of water every day to avoid constipation. You may also use a bulk laxative.   Avoid straining to have bowel movements.   Keep anal area dry and clean.   Do not use a donut shaped pillow or sit on the toilet for long periods. This increases blood pooling and pain.   Move your bowels when your body has the urge; this will require less straining and will decrease pain and pressure.

## 2017-09-14 NOTE — Transfer of Care (Signed)
Immediate Anesthesia Transfer of Care Note  Patient: Erica Price  Procedure(s) Performed: COLONOSCOPY WITH PROPOFOL (N/A ) POLYPECTOMY BIOPSY  Patient Location: PACU  Anesthesia Type:MAC  Level of Consciousness: awake, alert  and oriented  Airway & Oxygen Therapy: Patient Spontanous Breathing and Patient connected to nasal cannula oxygen  Post-op Assessment: Report given to RN and Post -op Vital signs reviewed and stable  Post vital signs: Reviewed and stable  Last Vitals:  Vitals:   09/14/17 0720 09/14/17 0725  BP: 114/69 119/63  Pulse:    Resp: 18 20  Temp:    SpO2: 96% 98%    Last Pain:  Vitals:   09/14/17 0628  TempSrc: Oral  PainSc: 2       Patients Stated Pain Goal: 7 (32/67/12 [id number])  Complications: No apparent anesthesia complications

## 2017-09-14 NOTE — Anesthesia Preprocedure Evaluation (Signed)
Anesthesia Evaluation  Patient identified by MRN, date of birth, ID band Patient awake    Reviewed: Allergy & Precautions, H&P , NPO status , Patient's Chart, lab work & pertinent test results  History of Anesthesia Complications (+) PONV and history of anesthetic complications  Airway Mallampati: II  TM Distance: >3 FB Neck ROM: Full    Dental  (+) Teeth Intact   Pulmonary sleep apnea (not tested) , former smoker,    breath sounds clear to auscultation       Cardiovascular negative cardio ROS   Rhythm:Regular Rate:Normal     Neuro/Psych PSYCHIATRIC DISORDERS Depression    GI/Hepatic GERD  Medicated,  Endo/Other  Morbid obesity  Renal/GU      Musculoskeletal  (+) Arthritis , Fibromyalgia -L5-S1 HNP hx with radiculopathy   Abdominal   Peds  Hematology   Anesthesia Other Findings   Reproductive/Obstetrics                             Anesthesia Physical Anesthesia Plan  ASA: II  Anesthesia Plan: MAC   Post-op Pain Management:    Induction: Intravenous  PONV Risk Score and Plan:   Airway Management Planned: Simple Face Mask  Additional Equipment:   Intra-op Plan:   Post-operative Plan:   Informed Consent: I have reviewed the patients History and Physical, chart, labs and discussed the procedure including the risks, benefits and alternatives for the proposed anesthesia with the patient or authorized representative who has indicated his/her understanding and acceptance.     Plan Discussed with:   Anesthesia Plan Comments:         Anesthesia Quick Evaluation

## 2017-09-14 NOTE — Op Note (Signed)
[facility] Patient Name: Erica Price Procedure Date: 09/14/2017 7:17 AM MRN: [id number] Date of Birth: 12-20-1960 Attending MD: Barney Drain MD, MD CSN: [id number] Age: 57 Admit Type: Outpatient Procedure:                Colonoscopy WITH COLD FORCEPS/SNARE POLYPECTOMY &                            COLD FORCEPS BIOPSY Indications:              Personal history of colonic polyps, Change in bowel                            habits Providers:                Barney Drain MD, MD, Otis Peak B. Sharon Seller, RN,                            Randa Spike, Technician Referring MD:             Halford Chessman MD Medicines:                Propofol per Anesthesia Complications:            No immediate complications. Estimated Blood Loss:     Estimated blood loss was minimal. Procedure:                Pre-Anesthesia Assessment:                           - Prior to the procedure, a History and Physical                            was performed, and patient medications and                            allergies were reviewed. The patient's tolerance of                            previous anesthesia was also reviewed. The risks                            and benefits of the procedure and the sedation                            options and risks were discussed with the patient.                            All questions were answered, and informed consent                            was obtained. Prior Anticoagulants: The patient has                            taken no previous anticoagulant or antiplatelet  agents. ASA Grade Assessment: II - A patient with                            mild systemic disease. After reviewing the risks                            and benefits, the patient was deemed in                            satisfactory condition to undergo the procedure.                            After obtaining informed consent, the colonoscope                            was  passed under direct vision. Throughout the                            procedure, the patient's blood pressure, pulse, and                            oxygen saturations were monitored continuously. The                            EC-[id number] ([id number]) scope was introduced through                            the anus and advanced to the 10 cm into the ileum.                            The colonoscopy was somewhat difficult due to a                            tortuous colon. Successful completion of the                            procedure was aided by COLOWRAP. The patient                            tolerated the procedure well. The quality of the                            bowel preparation was good. The terminal ileum,                            ileocecal valve, appendiceal orifice, and rectum                            were photographed. Scope In: 7:42:08 AM Scope Out: 8:04:11 AM Scope Withdrawal Time: 0 hours 18 minutes 54 seconds  Total Procedure Duration: 0 hours 22 minutes 3 seconds  Findings:      The terminal ileum appeared normal.      A 8 mm polyp was found in  the cecum. The polyp was sessile. The polyp       was removed with a hot snare. Resection and retrieval were complete.      A 4 mm polyp was found in the hepatic flexure. The polyp was sessile.       The polyp was removed with a cold biopsy forceps. Resection and       retrieval were complete.      A 5 mm polyp was found in the hepatic flexure. The polyp was sessile.       The polyp was removed with a cold snare. Resection and retrieval were       complete.      The recto-sigmoid colon, sigmoid colon and descending colon were       moderately redundant. The ENTIRE COLON WAS biopsied with a cold forceps       for evaluation of microscopic colitis.      Multiple small and large-mouthed diverticula were found in the       recto-sigmoid colon, sigmoid colon and descending colon.      External and internal hemorrhoids were found  during retroflexion. The       hemorrhoids were small. Impression:               - The examined portion of the ileum was normal.                           - One 8 mm polyp in the cecum, removed with a hot                            snare. Resected and retrieved.                           - One 4 mm polyp at the hepatic flexure, removed                            with a cold biopsy forceps. Resected and retrieved.                           - One 5 mm polyp at the hepatic flexure, removed                            with a cold snare. Resected and retrieved.                           - Redundant LEFT colon.                           - MODERATE Diverticulosis in the recto-sigmoid                            colon, in the sigmoid colon and in the descending                            colon.                           - External and internal hemorrhoids. Moderate Sedation:  Per Anesthesia Care Recommendation:           - Repeat colonoscopy in 3 years for surveillance W/                            MAC/COLOWRAP.                           - High fiber diet.                           - Continue present medications.                           - Await pathology results.                           - Return to my office in 4 months.                           - Patient has a contact number available for                            emergencies. The signs and symptoms of potential                            delayed complications were discussed with the                            patient. Return to normal activities tomorrow.                            Written discharge instructions were provided to the                            patient. Procedure Code(s):        --- Professional ---                           [phone], Colonoscopy, flexible; with removal of                            tumor(s), polyp(s), or other lesion(s) by snare                            technique                           45380, 67,  Colonoscopy, flexible; with biopsy,                            single or multiple Diagnosis Code(s):        --- Professional ---                           K64.8, Other hemorrhoids  D12.0, Benign neoplasm of cecum                           D12.3, Benign neoplasm of transverse colon (hepatic                            flexure or splenic flexure)                           Z86.010, Personal history of colonic polyps                           R19.4, Change in bowel habit                           K57.30, Diverticulosis of large intestine without                            perforation or abscess without bleeding                           Q43.8, Other specified congenital malformations of                            intestine CPT copyright 2016 American Medical Association. All rights reserved. The codes documented in this report are preliminary and upon coder review may  be revised to meet current compliance requirements. Barney Drain, MD Barney Drain MD, MD 09/14/2017 8:14:04 AM This report has been signed electronically. Number of Addenda: 0

## 2017-09-14 NOTE — Anesthesia Postprocedure Evaluation (Signed)
Anesthesia Post Note  Patient: Erica Price  Procedure(s) Performed: COLONOSCOPY WITH PROPOFOL (N/A ) POLYPECTOMY BIOPSY  Patient location during evaluation: PACU Anesthesia Type: MAC Level of consciousness: awake and alert, oriented and patient cooperative Pain management: pain level controlled Vital Signs Assessment: post-procedure vital signs reviewed and stable Respiratory status: spontaneous breathing Cardiovascular status: stable Postop Assessment: no apparent nausea or vomiting Anesthetic complications: no     Last Vitals:  Vitals:   09/14/17 0815 09/14/17 0837  BP: 108/70 126/77  Pulse: 80 81  Resp: 15   Temp:  36.8 C  SpO2: 100% 98%    Last Pain:  Vitals:   09/14/17 0837  TempSrc: Oral  PainSc:                  ADAMS, AMY A

## 2017-09-14 NOTE — H&P (Signed)
Primary Care Physician:  [name], MD Primary Gastroenterologist:  Dr. Oneida Alar  Pre-Procedure History & Physical: HPI:  Erica Price is a 57 y.o. female here for Change in bowel habits/ PERSONAL HISTORY OF POLYPS.  Past Medical History:  Diagnosis Date  . Apnea    Poss OSA  . Arthralgia   . Arthritis   . Depression   . Fatigue   . Fibromyalgia   . GERD (gastroesophageal reflux disease)   . Myalgia   . Pinched nerve    [id number]  . PONV (postoperative nausea and vomiting)   . Snoring     Past Surgical History:  Procedure Laterality Date  . ARTHROSCOPIC REPAIR ACL Left   . BIOPSY N/A 06/12/2014   Procedure: BIOPSY;  Surgeon: Danie Binder, MD;  Location: [facility];  Service: Endoscopy;  Laterality: N/A;  . CARPAL TUNNEL RELEASE Right   . CHOLECYSTECTOMY  1990s  . CLUB FOOT RELEASE Left    x3  . COLONOSCOPY WITH PROPOFOL N/A 06/12/2014   Dr. Suzette Battiest colon polyps removed/moderate divertiiculosis/small internal hemorrhoids/moderate sized external hemorrhoids. tubular adenomas. Next surveillance in Oct 2018.   Marland Kitchen ESOPHAGOGASTRODUODENOSCOPY (EGD) WITH PROPOFOL N/A 06/12/2014   Dr. 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 benign gastric and duodenal biopsies  . POLYPECTOMY N/A 06/12/2014   Procedure: POLYPECTOMY;  Surgeon: Danie Binder, MD;  Location: [facility];  Service: Endoscopy;  Laterality: N/A;  . SAVORY DILATION N/A 06/12/2014   Procedure: SAVORY DILATION;  Surgeon: Danie Binder, MD;  Location: [facility];  Service: Endoscopy;  Laterality: N/A;  12.8-17    Prior to Admission medications   Medication Sig Start Date End Date Taking? Authorizing Provider  albuterol (PROVENTIL HFA;VENTOLIN HFA) 108 (90 Base) MCG/ACT inhaler Inhale 2 puffs into the lungs every 6 (six) hours as needed for wheezing or shortness of breath.   Yes [provider]  Artificial Tear Solution (SOOTHE XP) SOLN Apply 1 drop to eye daily as needed  (dry eyes).   Yes [provider]  canagliflozin (INVOKANA) 300 MG TABS tablet Take 300 mg daily before breakfast by mouth.   Yes [provider]  Cinnamon 500 MG capsule Take 500 mg by mouth daily.   Yes [provider]  dicyclomine (BENTYL) 10 MG capsule Take 1 capsule (10 mg total) 4 (four) times daily -  before meals and at bedtime by mouth. For loose stool and abdominal crmaping 07/01/17  Yes Kindall Swaby L, MD  DULoxetine (CYMBALTA) 60 MG capsule Take 60 mg daily by mouth.    Yes [provider]  fluticasone (FLONASE) 50 MCG/ACT nasal spray Place 2 sprays into both nostrils daily as needed for allergies or rhinitis.   Yes [provider]  gabapentin (NEURONTIN) 300 MG capsule Take 600 mg by mouth 3 (three) times daily.   Yes [provider]  lidocaine (LINDAMANTLE) 3 % CREA cream Apply 1 application topically as needed (joint pain).  05/20/15  Yes [provider]  metFORMIN (GLUCOPHAGE) 1000 MG tablet Take 1,000 mg by mouth 2 (two) times daily with a meal.   Yes [provider]  pantoprazole (PROTONIX) 40 MG tablet TAKE ONE TABLET BY MOUTH DAILY 30 MINUTES PRIOR TO BREAKFAST. 04/15/17  Yes Carlis Stable, NP  traMADol (ULTRAM) 50 MG tablet Take 50-100 mg by mouth every 6 (six) hours as needed for moderate pain (depends on pain level if takes  1-2 tablets).  05/25/14  Yes [provider]  zolpidem (AMBIEN) 10 MG tablet Take 5-10 mg by mouth at bedtime as needed for sleep (depends on insomnia if takes 0.5-1 tablet).  04/21/14  Yes [provider]  furosemide (LASIX) 20 MG tablet Take 20 mg by mouth daily as needed for fluid or edema.     [provider]  Ketorolac Tromethamine (TORADOL IJ) Inject as directed every 6 (six) weeks. Gets at dr office    [provider]    Allergies as of 07/01/2017  . (No Known Allergies)    Family History  Problem Relation Age of Onset  . Heart failure Father         Died, 78  . Hypertension Mother        Living, 71  . Breast cancer Mother   . Kidney cancer Mother   . Hypercholesterolemia Son   . Colon cancer Neg Hx     Social History   Socioeconomic History  . Marital status: Married    Spouse name: Not on file  . Number of children: Not on file  . Years of education: Not on file  . Highest education level: Not on file  Social Needs  . Financial resource strain: Not on file  . Food insecurity - worry: Not on file  . Food insecurity - inability: Not on file  . Transportation needs - medical: Not on file  . Transportation needs - non-medical: Not on file  Occupational History  . Not on file  Tobacco Use  . Smoking status: Former Smoker    Packs/day: 0.50    Years: 20.00    Pack years: 10.00    Types: Cigarettes    Last attempt to quit: 10/18/2011    Years since quitting: 5.9  . Smokeless tobacco: Never Used  Substance and Sexual Activity  . Alcohol use: No    Frequency: Never  . Drug use: No  . Sexual activity: Yes    Birth control/protection: Post-menopausal  Other Topics Concern  . Not on file  Social History Narrative   She is currently unemployed.  She was terminated on 06/15/2013.  She was previously a Loss adjuster, chartered, worked at [employer], and worked in Charity fundraiser for 25 years.   She lives with husband.  They have two children.    Review of Systems: See HPI, otherwise negative ROS   Physical Exam: BP 134/72   Pulse 86   Temp 98.2 F (36.8 C) (Oral)   Resp 17   Ht 5\' 2"  (1.575 m)   Wt 204 lb (92.5 kg)   SpO2 97%   BMI 37.31 kg/m  General:   Alert,  pleasant and cooperative in NAD Head:  Normocephalic and atraumatic. Neck:  Supple; Lungs:  Clear throughout to auscultation.    Heart:  Regular rate and rhythm. Abdomen:  Soft, nontender and nondistended. Normal bowel sounds, without guarding, and without rebound.   Neurologic:  Alert and  oriented x4;  grossly normal neurologically.  Impression/Plan:      Change in bowel habits  PLAN:  1. TCS TODAY DISCUSSED PROCEDURE, BENEFITS, & RISKS: < 1% chance of medication reaction, bleeding, perforation, or rupture of spleen/liver.

## 2017-09-15 ENCOUNTER — Encounter (HOSPITAL_COMMUNITY): Payer: Self-pay | Admitting: Gastroenterology

## 2017-09-23 ENCOUNTER — Encounter: Payer: Self-pay | Admitting: Gastroenterology

## 2017-09-23 NOTE — Progress Notes (Signed)
PT is aware.

## 2017-10-08 ENCOUNTER — Other Ambulatory Visit: Payer: Self-pay | Admitting: Nurse Practitioner

## 2017-10-18 DIAGNOSIS — E782 Mixed hyperlipidemia: Secondary | ICD-10-CM | POA: Diagnosis not present

## 2017-10-18 DIAGNOSIS — M797 Fibromyalgia: Secondary | ICD-10-CM | POA: Diagnosis not present

## 2017-10-18 DIAGNOSIS — Z1389 Encounter for screening for other disorder: Secondary | ICD-10-CM | POA: Diagnosis not present

## 2017-10-18 DIAGNOSIS — I10 Essential (primary) hypertension: Secondary | ICD-10-CM | POA: Diagnosis not present

## 2017-10-18 DIAGNOSIS — G894 Chronic pain syndrome: Secondary | ICD-10-CM | POA: Diagnosis not present

## 2017-10-18 DIAGNOSIS — E1165 Type 2 diabetes mellitus with hyperglycemia: Secondary | ICD-10-CM | POA: Diagnosis not present

## 2017-11-09 ENCOUNTER — Ambulatory Visit (HOSPITAL_COMMUNITY): Payer: Medicare Other | Admitting: Hematology

## 2017-11-11 ENCOUNTER — Inpatient Hospital Stay (HOSPITAL_COMMUNITY): Payer: Medicare Other

## 2017-11-11 ENCOUNTER — Inpatient Hospital Stay (HOSPITAL_COMMUNITY): Payer: Medicare Other | Attending: Hematology | Admitting: Hematology

## 2017-11-11 ENCOUNTER — Encounter (HOSPITAL_COMMUNITY): Payer: Self-pay | Admitting: Hematology

## 2017-11-11 ENCOUNTER — Other Ambulatory Visit (HOSPITAL_COMMUNITY): Payer: Self-pay | Admitting: *Deleted

## 2017-11-11 VITALS — BP 149/71 | HR 90 | Temp 98.3°F | Resp 18 | Ht 61.0 in | Wt 206.5 lb

## 2017-11-11 DIAGNOSIS — E119 Type 2 diabetes mellitus without complications: Secondary | ICD-10-CM | POA: Insufficient documentation

## 2017-11-11 DIAGNOSIS — K219 Gastro-esophageal reflux disease without esophagitis: Secondary | ICD-10-CM

## 2017-11-11 DIAGNOSIS — D473 Essential (hemorrhagic) thrombocythemia: Secondary | ICD-10-CM | POA: Diagnosis not present

## 2017-11-11 DIAGNOSIS — Z8051 Family history of malignant neoplasm of kidney: Secondary | ICD-10-CM

## 2017-11-11 DIAGNOSIS — Z800 Family history of malignant neoplasm of digestive organs: Secondary | ICD-10-CM

## 2017-11-11 DIAGNOSIS — D72829 Elevated white blood cell count, unspecified: Secondary | ICD-10-CM

## 2017-11-11 DIAGNOSIS — M797 Fibromyalgia: Secondary | ICD-10-CM | POA: Diagnosis not present

## 2017-11-11 DIAGNOSIS — M1990 Unspecified osteoarthritis, unspecified site: Secondary | ICD-10-CM | POA: Insufficient documentation

## 2017-11-11 DIAGNOSIS — Z87891 Personal history of nicotine dependence: Secondary | ICD-10-CM

## 2017-11-11 DIAGNOSIS — Z79899 Other long term (current) drug therapy: Secondary | ICD-10-CM

## 2017-11-11 DIAGNOSIS — D72825 Bandemia: Secondary | ICD-10-CM

## 2017-11-11 DIAGNOSIS — Z7984 Long term (current) use of oral hypoglycemic drugs: Secondary | ICD-10-CM | POA: Diagnosis not present

## 2017-11-11 DIAGNOSIS — K589 Irritable bowel syndrome without diarrhea: Secondary | ICD-10-CM | POA: Diagnosis not present

## 2017-11-11 DIAGNOSIS — D75839 Thrombocytosis, unspecified: Secondary | ICD-10-CM

## 2017-11-11 DIAGNOSIS — Z803 Family history of malignant neoplasm of breast: Secondary | ICD-10-CM | POA: Diagnosis not present

## 2017-11-11 LAB — CBC WITH DIFFERENTIAL/PLATELET
BASOS ABS: 0.1 10*3/uL (ref 0.0–0.1)
Basophils Relative: 1 %
EOS ABS: 0.2 10*3/uL (ref 0.0–0.7)
EOS PCT: 2 %
HCT: 46.8 % — ABNORMAL HIGH (ref 36.0–46.0)
Hemoglobin: 14.7 g/dL (ref 12.0–15.0)
LYMPHS ABS: 2.8 10*3/uL (ref 0.7–4.0)
LYMPHS PCT: 23 %
MCH: 28.2 pg (ref 26.0–34.0)
MCHC: 31.4 g/dL (ref 30.0–36.0)
MCV: 89.7 fL (ref 78.0–100.0)
MONO ABS: 0.6 10*3/uL (ref 0.1–1.0)
Monocytes Relative: 5 %
Neutro Abs: 8.3 10*3/uL — ABNORMAL HIGH (ref 1.7–7.7)
Neutrophils Relative %: 69 %
PLATELETS: 381 10*3/uL (ref 150–400)
RBC: 5.22 MIL/uL — AB (ref 3.87–5.11)
RDW: 15.4 % (ref 11.5–15.5)
WBC: 12.1 10*3/uL — AB (ref 4.0–10.5)

## 2017-11-11 LAB — LACTATE DEHYDROGENASE: LDH: 138 U/L (ref 98–192)

## 2017-11-11 NOTE — Patient Instructions (Signed)
[facility] Discharge Instructions  You saw Dr. K today.   Thank you for choosing [facility] at [facility] to provide your oncology and hematology care.  To afford each patient quality time with our provider, please arrive at least 15 minutes before your scheduled appointment time.   If you have a lab appointment with the Cancer Center please come in thru the  Main Entrance and check in at the main information desk  You need to re-schedule your appointment should you arrive 10 or more minutes late.  We strive to give you quality time with our providers, and arriving late affects you and other patients whose appointments are after yours.  Also, if you no show three or more times for appointments you may be dismissed from the clinic at the providers discretion.     Again, thank you for choosing [facility].  Our hope is that these requests will decrease the amount of time that you wait before being seen by our physicians.       _____________________________________________________________  Should you have questions after your visit to [facility], please contact our office at [phone] between the hours of 8:30 a.m. and 4:30 p.m.  Voicemails left after 4:30 p.m. will not be returned until the following business day.  For prescription refill requests, have your pharmacy contact our office.       Resources For Cancer Patients and their Caregivers ? American Cancer Society: Can assist with transportation, wigs, general needs, runs Look Good Feel Better.        1-888-227-6333 ? Cancer Care: Provides financial assistance, online support groups, medication/co-pay assistance.  1-800-813-HOPE (4673) ? [facility] Assists [place] cancer patients and their families through emotional , educational and financial support.  336-427-4357 ? [place] DSS Where to apply for food  stamps, Medicaid and utility assistance. 336-342-1394 ? [facility]: Transportation to medical appointments. 336-347-2287 ? Social Security Administration: May apply for disability if have a Stage IV cancer. 336-342-7796 1-800-772-1213 ? [place] [facility]ging, Disability and Transit Services: Assists with nutrition, care and transit needs. 336-349-2343  Cancer Center Support Programs:   > Cancer Support Group  2nd Tuesday of the month 1pm-2pm, Journey Room   > Creative Journey  3rd Tuesday of the month 1130am-1pm, Journey Room     

## 2017-11-11 NOTE — Progress Notes (Signed)
CONSULT NOTE  Patient Care Team: [name], MD as PCP - General (Family Medicine) Danie Binder, MD as Consulting Physician (Gastroenterology)  CHIEF COMPLAINTS/PURPOSE OF CONSULTATION:  Leukocytosis and thrombocytosis.  HISTORY OF PRESENTING ILLNESS:  Erica Price 57 y.o. female is seen today for further evaluation and management of leukocytosis and thrombocytosis.  She reports having a CBC done as a preop workup for a colonoscopy on July 27, 2017 when her white count was found to be 12,000.  Differential showed 65% neutrophils, with elevated absolute neutrophil count.  Platelet count was normal.  She had CBC repeated on 09/01/2017 which showed white count of 13,000 with elevated platelet count.  Apparently this was repeated again at Dr. Delanna Ahmadi office in February which was also high.  She was sent to [name] for further evaluation.  She denies any fevers, night sweats or weight loss.  However she reports sweating a lot while doing household work.  She is currently on disability from fibromyalgia.  She was an ex-smoker who quit in 2014.  She denies any infections or taking steroids prior to blood count checks.  She had a colonoscopy on 09/14/2017 which showed small polyps in the cecum and hepatic flexure.  These were biopsied consistent with tubular adenoma.    MEDICAL HISTORY:  Past Medical History:  Diagnosis Date  . Apnea    Poss OSA  . Arthralgia   . Arthritis   . Depression   . Diabetes mellitus without complication ([place])   . Fatigue   . Fibromyalgia   . GERD (gastroesophageal reflux disease)   . IBS (irritable bowel syndrome)   . Myalgia   . Pinched nerve    lumbar-l5-s1  . PONV (postoperative nausea and vomiting)   . Snoring     SURGICAL HISTORY: Past Surgical History:  Procedure Laterality Date  . ARTHROSCOPIC REPAIR ACL Left   . BIOPSY N/A 06/12/2014   Procedure: BIOPSY;  Surgeon: Danie Binder, MD;  Location: [facility];  Service: Endoscopy;  Laterality: N/A;   . BIOPSY  09/14/2017   Procedure: BIOPSY;  Surgeon: Danie Binder, MD;  [facility];  Service: Endoscopy;;  colon  . CARPAL TUNNEL RELEASE Right   . CHOLECYSTECTOMY  1990s  . CLUB FOOT RELEASE Left    x3  . COLONOSCOPY WITH PROPOFOL N/A 06/12/2014   Dr. Suzette Battiest colon polyps removed/moderate divertiiculosis/small internal hemorrhoids/moderate sized external hemorrhoids. tubular adenomas. Next surveillance in Oct 2018.   Marland Kitchen COLONOSCOPY WITH PROPOFOL N/A 09/14/2017   Procedure: COLONOSCOPY WITH PROPOFOL;  Surgeon: Danie Binder, MD;  [facility];  Service: Endoscopy;  Laterality: N/A;  7:30am  . ESOPHAGOGASTRODUODENOSCOPY (EGD) WITH PROPOFOL N/A 06/12/2014   Dr. 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 benign gastric and duodenal biopsies  . POLYPECTOMY N/A 06/12/2014   Procedure: POLYPECTOMY;  Surgeon: Danie Binder, MD;  Location: [facility];  Service: Endoscopy;  Laterality: N/A;  . POLYPECTOMY  09/14/2017   Procedure: POLYPECTOMY;  Surgeon: Danie Binder, MD;  [facility];  Service: Endoscopy;;  colon  . SAVORY DILATION N/A 06/12/2014   Procedure: SAVORY DILATION;  Surgeon: Danie Binder, MD;  Location: [facility];  Service: Endoscopy;  Laterality: N/A;  12.8-17    SOCIAL HISTORY: Social History   Socioeconomic History  . Marital status: Married    Spouse name: Marcello Moores  . Number of children: 2  . Years of education: Not on file  . Highest education  level: Associate degree: occupational, Hotel manager, or vocational program  Occupational History  . Occupation: disabled  Social Needs  . Financial resource strain: Not hard at all  . Food insecurity:    Worry: Never true    Inability: Never true  . Transportation needs:    Medical: No    Non-medical: No  Tobacco Use  . Smoking status: Former Smoker    Packs/day: 0.50    Years: 20.00    Pack years: 10.00    Types: Cigarettes    Last attempt to quit:  10/18/2011    Years since quitting: 6.0  . Smokeless tobacco: Never Used  . Tobacco comment: quit in 2014  Substance and Sexual Activity  . Alcohol use: No    Frequency: Never  . Drug use: No  . Sexual activity: Yes    Birth control/protection: Post-menopausal  Lifestyle  . Physical activity:    Days per week: 1 day    Minutes per session: 20 min  . Stress: Not at all  Relationships  . Social connections:    Talks on phone: More than three times a week    Gets together: Once a week    Attends religious service: 1 to 4 times per year    Active member of club or organization: No    Attends meetings of clubs or organizations: Never    Relationship status: Married  . Intimate partner violence:    Fear of current or ex partner: No    Emotionally abused: No    Physically abused: No    Forced sexual activity: No  Other Topics Concern  . Not on file  Social History Narrative   She is currently unemployed.  She was terminated on 06/15/2013.  She was previously a Loss adjuster, chartered, worked at [employer], and worked in Charity fundraiser for 25 years.   She lives with husband.  They have two children.    FAMILY HISTORY: Family History  Problem Relation Age of Onset  . Heart failure Father        Died, 78  . Hypertension Mother        Living, 29  . Breast cancer Mother   . Kidney cancer Mother   . Hypercholesterolemia Son   . Breast cancer Sister   . Pancreatic cancer Brother        passed age 26  . Hypertension Sister   . Colon cancer Neg Hx     ALLERGIES:  has No Known Allergies.  MEDICATIONS:  Current Outpatient Medications  Medication Sig Dispense Refill  . albuterol (PROVENTIL HFA;VENTOLIN HFA) 108 (90 Base) MCG/ACT inhaler Inhale 2 puffs into the lungs every 6 (six) hours as needed for wheezing or shortness of breath.    . Artificial Tear Solution (SOOTHE XP) SOLN Apply 1 drop to eye daily as needed (dry eyes).    . canagliflozin (INVOKANA) 300 MG TABS tablet Take 300 mg daily  before breakfast by mouth.    . Cinnamon 500 MG capsule Take 500 mg by mouth daily.    Marland Kitchen dicyclomine (BENTYL) 10 MG capsule Take 1 capsule (10 mg total) 4 (four) times daily -  before meals and at bedtime by mouth. For loose stool and abdominal crmaping 120 capsule 11  . DULoxetine (CYMBALTA) 60 MG capsule Take 60 mg daily by mouth.     . fluticasone (FLONASE) 50 MCG/ACT nasal spray Place 2 sprays into both nostrils daily as needed for allergies or rhinitis.    . furosemide (LASIX) 20 MG  tablet Take 20 mg by mouth daily as needed for fluid or edema.     . gabapentin (NEURONTIN) 300 MG capsule Take 600 mg by mouth 3 (three) times daily.    Marland Kitchen Ketorolac Tromethamine (TORADOL IJ) Inject as directed every 6 (six) weeks. Gets at dr office    . lidocaine (LINDAMANTLE) 3 % CREA cream Apply 1 application topically as needed (joint pain).   4  . metFORMIN (GLUCOPHAGE) 1000 MG tablet Take 1,000 mg by mouth 2 (two) times daily with a meal.    . pantoprazole (PROTONIX) 40 MG tablet TAKE ONE TABLET BY MOUTH DAILY 30 MINUTES PRIOR TO BREAKFAST. 90 tablet 1  . traMADol (ULTRAM) 50 MG tablet Take 50-100 mg by mouth every 6 (six) hours as needed for moderate pain (depends on pain level if takes  1-2 tablets).     . zolpidem (AMBIEN) 10 MG tablet Take 5-10 mg by mouth at bedtime as needed for sleep (depends on insomnia if takes 0.5-1 tablet).      No current facility-administered medications for this visit.     REVIEW OF SYSTEMS:   Constitutional: Denies fevers, chills or abnormal night sweats Eyes: Denies blurriness of vision, double vision or watery eyes Ears, nose, mouth, throat, and face: Denies mucositis or sore throat Respiratory: Denies cough, dyspnea or wheezes Cardiovascular: Denies palpitation, chest discomfort or lower extremity swelling Gastrointestinal:  Denies nausea, heartburn or change in bowel habits Skin: Denies abnormal skin rashes Lymphatics: Denies new lymphadenopathy or easy  bruising Neurological:Denies numbness, tingling or new weaknesses Behavioral/Psych: Mood is stable, no new changes  All other systems were reviewed with the patient and are negative.  PHYSICAL EXAMINATION: ECOG PERFORMANCE STATUS: 1 - Symptomatic but completely ambulatory  Vitals:   11/11/17 1111  BP: (!) 149/71  Pulse: 90  Resp: 18  Temp: 98.3 F (36.8 C)  SpO2: 97%   Filed Weights   11/11/17 1111  Weight: 206 lb 8 oz (93.7 kg)    GENERAL:alert, no distress and comfortable SKIN: skin color, texture, turgor are normal, no rashes or significant lesions EYES: normal, conjunctiva are pink and non-injected, sclera clear OROPHARYNX:no exudate, no erythema and lips, buccal mucosa, and tongue normal  NECK: supple, thyroid normal size, non-tender, without nodularity LYMPH:  no palpable lymphadenopathy in the cervical, axillary or inguinal LUNGS: clear to auscultation and percussion with normal breathing effort HEART: regular rate & rhythm and no murmurs and no lower extremity edema ABDOMEN:abdomen soft, non-tender and normal bowel sounds Musculoskeletal:no cyanosis of digits and no clubbing  PSYCH: alert & oriented x 3 with fluent speech NEURO: no focal motor/sensory deficits  LABORATORY DATA:  I have reviewed the data as listed Recent Results (from the past 2160 hour(s))  CBC with Differential     Status: Abnormal   Collection Time: 09/01/17  2:33 PM  Result Value Ref Range   WBC 13.0 (H) 3.8 - 10.8 Thousand/uL   RBC 5.24 (H) 3.80 - 5.10 Million/uL   Hemoglobin 15.1 11.7 - 15.5 g/dL   HCT 44.1 35.0 - 45.0 %   MCV 84.2 80.0 - 100.0 fL   MCH 28.8 27.0 - 33.0 pg   MCHC 34.2 32.0 - 36.0 g/dL   RDW 14.4 11.0 - 15.0 %   Platelets 448 (H) 140 - 400 Thousand/uL   MPV 8.8 7.5 - 12.5 fL   Neutro Abs 8,567 (H) 1,500 - 7,800 cells/uL   Lymphs Abs 3,172 850 - 3,900 cells/uL   WBC mixed population 949 200 -  950 cells/uL   Eosinophils Absolute 156 15 - 500 cells/uL   Basophils  Absolute 156 0 - 200 cells/uL   Neutrophils Relative % 65.9 %   Total Lymphocyte 24.4 %   Monocytes Relative 7.3 %   Eosinophils Relative 1.2 %   Basophils Relative 1.2 %  Glucose, capillary     Status: Abnormal   Collection Time: 09/14/17  6:38 AM  Result Value Ref Range   Glucose-Capillary 167 (H) 65 - 99 mg/dL  Glucose, capillary     Status: Abnormal   Collection Time: 09/14/17  8:17 AM  Result Value Ref Range   Glucose-Capillary 159 (H) 65 - 99 mg/dL    RADIOGRAPHIC STUDIES: I have personally reviewed her colonoscopy and path reports.  ASSESSMENT & PLAN:  1. 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  She does not have any palpable adenopathy or palpable hepatosplenomegaly.  She does not have any B symptoms.  She did not have any infections or was on steroids prior to checking of her blood counts.  I would repeat a CBC with differential today.  I would check LDH levels.  We will also review her peripheral smear.  We will check for JAK [id number] mutation to rule out myeloproliferative disorders.  We will also send BCR/ABL by FISH to check for CML. We will see her back in 3-4 weeks to discuss the results.  2.  Thrombocytosis: She was also found to have elevated platelet count in January of 448.  This could be reactive or coming from myeloproliferative disorders.  Again, the above tests will help [name].       Derek Jack, MD 11/11/17 11:59 AM

## 2017-11-12 DIAGNOSIS — M797 Fibromyalgia: Secondary | ICD-10-CM | POA: Diagnosis not present

## 2017-11-12 DIAGNOSIS — Z7984 Long term (current) use of oral hypoglycemic drugs: Secondary | ICD-10-CM | POA: Diagnosis not present

## 2017-11-12 DIAGNOSIS — M1990 Unspecified osteoarthritis, unspecified site: Secondary | ICD-10-CM | POA: Diagnosis not present

## 2017-11-12 DIAGNOSIS — K589 Irritable bowel syndrome without diarrhea: Secondary | ICD-10-CM | POA: Diagnosis not present

## 2017-11-12 DIAGNOSIS — Z803 Family history of malignant neoplasm of breast: Secondary | ICD-10-CM | POA: Diagnosis not present

## 2017-11-12 DIAGNOSIS — D473 Essential (hemorrhagic) thrombocythemia: Secondary | ICD-10-CM | POA: Diagnosis not present

## 2017-11-12 DIAGNOSIS — Z8051 Family history of malignant neoplasm of kidney: Secondary | ICD-10-CM | POA: Diagnosis not present

## 2017-11-12 DIAGNOSIS — Z800 Family history of malignant neoplasm of digestive organs: Secondary | ICD-10-CM | POA: Diagnosis not present

## 2017-11-12 DIAGNOSIS — Z79899 Other long term (current) drug therapy: Secondary | ICD-10-CM | POA: Diagnosis not present

## 2017-11-12 DIAGNOSIS — Z87891 Personal history of nicotine dependence: Secondary | ICD-10-CM | POA: Diagnosis not present

## 2017-11-12 DIAGNOSIS — K219 Gastro-esophageal reflux disease without esophagitis: Secondary | ICD-10-CM | POA: Diagnosis not present

## 2017-11-12 DIAGNOSIS — D72829 Elevated white blood cell count, unspecified: Secondary | ICD-10-CM | POA: Diagnosis not present

## 2017-11-12 DIAGNOSIS — E119 Type 2 diabetes mellitus without complications: Secondary | ICD-10-CM | POA: Diagnosis not present

## 2017-11-15 LAB — JAK2 GENOTYPR

## 2017-11-18 ENCOUNTER — Encounter (HOSPITAL_COMMUNITY): Payer: Self-pay | Admitting: Hematology

## 2017-11-19 LAB — TISSUE HYBRIDIZATION TO NCBH

## 2017-12-14 ENCOUNTER — Inpatient Hospital Stay (HOSPITAL_COMMUNITY): Payer: Medicare Other | Attending: Hematology | Admitting: Hematology

## 2017-12-14 ENCOUNTER — Other Ambulatory Visit: Payer: Self-pay

## 2017-12-14 ENCOUNTER — Encounter (HOSPITAL_COMMUNITY): Payer: Self-pay | Admitting: Hematology

## 2017-12-14 DIAGNOSIS — R200 Anesthesia of skin: Secondary | ICD-10-CM | POA: Diagnosis not present

## 2017-12-14 DIAGNOSIS — Z800 Family history of malignant neoplasm of digestive organs: Secondary | ICD-10-CM

## 2017-12-14 DIAGNOSIS — M797 Fibromyalgia: Secondary | ICD-10-CM

## 2017-12-14 DIAGNOSIS — Z79899 Other long term (current) drug therapy: Secondary | ICD-10-CM | POA: Insufficient documentation

## 2017-12-14 DIAGNOSIS — Z8051 Family history of malignant neoplasm of kidney: Secondary | ICD-10-CM | POA: Diagnosis not present

## 2017-12-14 DIAGNOSIS — D72828 Other elevated white blood cell count: Secondary | ICD-10-CM

## 2017-12-14 DIAGNOSIS — R5383 Other fatigue: Secondary | ICD-10-CM

## 2017-12-14 DIAGNOSIS — K589 Irritable bowel syndrome without diarrhea: Secondary | ICD-10-CM | POA: Diagnosis not present

## 2017-12-14 DIAGNOSIS — L299 Pruritus, unspecified: Secondary | ICD-10-CM | POA: Insufficient documentation

## 2017-12-14 DIAGNOSIS — M1990 Unspecified osteoarthritis, unspecified site: Secondary | ICD-10-CM | POA: Diagnosis not present

## 2017-12-14 DIAGNOSIS — Z7984 Long term (current) use of oral hypoglycemic drugs: Secondary | ICD-10-CM

## 2017-12-14 DIAGNOSIS — D72829 Elevated white blood cell count, unspecified: Secondary | ICD-10-CM | POA: Insufficient documentation

## 2017-12-14 DIAGNOSIS — Z780 Asymptomatic menopausal state: Secondary | ICD-10-CM | POA: Diagnosis not present

## 2017-12-14 DIAGNOSIS — Z87891 Personal history of nicotine dependence: Secondary | ICD-10-CM | POA: Diagnosis not present

## 2017-12-14 DIAGNOSIS — E119 Type 2 diabetes mellitus without complications: Secondary | ICD-10-CM | POA: Diagnosis not present

## 2017-12-14 DIAGNOSIS — D72825 Bandemia: Secondary | ICD-10-CM

## 2017-12-14 DIAGNOSIS — R197 Diarrhea, unspecified: Secondary | ICD-10-CM | POA: Insufficient documentation

## 2017-12-14 DIAGNOSIS — K219 Gastro-esophageal reflux disease without esophagitis: Secondary | ICD-10-CM | POA: Insufficient documentation

## 2017-12-14 DIAGNOSIS — Z803 Family history of malignant neoplasm of breast: Secondary | ICD-10-CM | POA: Diagnosis not present

## 2017-12-14 NOTE — Patient Instructions (Signed)
[facility] at [facility] Discharge Instructions  Today you saw Dr. K.   Thank you for choosing [facility] at [facility] to provide your oncology and hematology care.  To afford each patient quality time with our provider, please arrive at least 15 minutes before your scheduled appointment time.   If you have a lab appointment with the Cancer Center please come in thru the  Main Entrance and check in at the main information desk  You need to re-schedule your appointment should you arrive 10 or more minutes late.  We strive to give you quality time with our providers, and arriving late affects you and other patients whose appointments are after yours.  Also, if you no show three or more times for appointments you may be dismissed from the clinic at the providers discretion.     Again, thank you for choosing [facility].  Our hope is that these requests will decrease the amount of time that you wait before being seen by our physicians.       _____________________________________________________________  Should you have questions after your visit to [facility], please contact our office at [phone] between the hours of 8:30 a.m. and 4:30 p.m.  Voicemails left after 4:30 p.m. will not be returned until the following business day.  For prescription refill requests, have your pharmacy contact our office.       Resources For Cancer Patients and their Caregivers ? American Cancer Society: Can assist with transportation, wigs, general needs, runs Look Good Feel Better.        1-888-227-6333 ? Cancer Care: Provides financial assistance, online support groups, medication/co-pay assistance.  1-800-813-HOPE (4673) ? [facility] Assists [place] cancer patients and their families through emotional , educational and financial support.  336-427-4357 ? [place] DSS Where to apply for food  stamps, Medicaid and utility assistance. 336-342-1394 ? [facility]: Transportation to medical appointments. 336-347-2287 ? Social Security Administration: May apply for disability if have a Stage IV cancer. 336-342-7796 1-800-772-1213 ? [place] Aging, Disability and Transit Services: Assists with nutrition, care and transit needs. 336-349-2343  Cancer Center Support Programs:   > Cancer Support Group  2nd Tuesday of the month 1pm-2pm, Journey Room   > Creative Journey  3rd Tuesday of the month 1130am-1pm, Journey Room    

## 2017-12-14 NOTE — Progress Notes (Signed)
[address] [place], [place] [address]   CLINIC:  Medical Oncology/Hematology  PCP:  [name], [place] [address] [phone]   REASON FOR VISIT:  Follow-up for neutrophilic leukocytosis.   INTERVAL HISTORY:  Erica Price 57 y.o. female returns for follow-up of leukocytosis.  She denies any fevers, night sweats or weight loss.  She denies active smoking, quit in 2014.  She denies any history of splenectomy.  No active steroid use.  No active infections.  No history of connective tissue disorders.    REVIEW OF SYSTEMS:  Review of Systems  Constitutional: Positive for fatigue.  Gastrointestinal: Positive for diarrhea.  Skin: Positive for itching.  Neurological: Positive for numbness.  All other systems reviewed and are negative.    PAST MEDICAL/SURGICAL HISTORY:  Past Medical History:  Diagnosis Date  . Apnea    Poss OSA  . Arthralgia   . Arthritis   . Depression   . Diabetes mellitus without complication ([place])   . Fatigue   . Fibromyalgia   . GERD (gastroesophageal reflux disease)   . IBS (irritable bowel syndrome)   . Myalgia   . Pinched nerve    lumbar-l5-s1  . PONV (postoperative nausea and vomiting)   . Snoring    Past Surgical History:  Procedure Laterality Date  . ARTHROSCOPIC REPAIR ACL Left   . BIOPSY N/A 06/12/2014   Procedure: BIOPSY;  Surgeon: Danie Binder, MD;  Location: [facility];  Service: Endoscopy;  Laterality: N/A;  . BIOPSY  09/14/2017   Procedure: BIOPSY;  Surgeon: Danie Binder, MD;  [facility];  Service: Endoscopy;;  colon  . CARPAL TUNNEL RELEASE Right   . CHOLECYSTECTOMY  1990s  . CLUB FOOT RELEASE Left    x3  . COLONOSCOPY WITH PROPOFOL N/A 06/12/2014   Dr. Suzette Battiest colon polyps removed/moderate divertiiculosis/small internal hemorrhoids/moderate sized external hemorrhoids. tubular adenomas. Next surveillance in Oct 2018.   Marland Kitchen COLONOSCOPY WITH PROPOFOL N/A  09/14/2017   Procedure: COLONOSCOPY WITH PROPOFOL;  Surgeon: Danie Binder, MD;  [facility];  Service: Endoscopy;  Laterality: N/A;  7:30am  . ESOPHAGOGASTRODUODENOSCOPY (EGD) WITH PROPOFOL N/A 06/12/2014   Dr. 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 benign gastric and duodenal biopsies  . POLYPECTOMY N/A 06/12/2014   Procedure: POLYPECTOMY;  Surgeon: Danie Binder, MD;  Location: [facility];  Service: Endoscopy;  Laterality: N/A;  . POLYPECTOMY  09/14/2017   Procedure: POLYPECTOMY;  Surgeon: Danie Binder, MD;  [facility];  Service: Endoscopy;;  colon  . SAVORY DILATION N/A 06/12/2014   Procedure: SAVORY DILATION;  Surgeon: Danie Binder, MD;  Location: [facility];  Service: Endoscopy;  Laterality: N/A;  12.8-17     SOCIAL HISTORY:  Social History   Socioeconomic History  . Marital status: Married    Spouse name: Marcello Moores  . Number of children: 2  . Years of education: Not on file  . Highest education level: Associate degree: occupational, Hotel manager, or vocational program  Occupational History  . Occupation: disabled  Social Needs  . Financial resource strain: Not hard at all  . Food insecurity:    Worry: Never true    Inability: Never true  . Transportation needs:    Medical: No    Non-medical: No  Tobacco Use  . Smoking status: Former Smoker    Packs/day: 0.50    Years: 20.00    Pack years: 10.00  Types: Cigarettes    Last attempt to quit: 10/18/2011    Years since quitting: 6.1  . Smokeless tobacco: Never Used  . Tobacco comment: quit in 2014  Substance and Sexual Activity  . Alcohol use: No    Frequency: Never  . Drug use: No  . Sexual activity: Yes    Birth control/protection: Post-menopausal  Lifestyle  . Physical activity:    Days per week: 1 day    Minutes per session: 20 min  . Stress: Not at all  Relationships  . Social connections:    Talks on phone: More than three times a week    Gets  together: Once a week    Attends religious service: 1 to 4 times per year    Active member of club or organization: No    Attends meetings of clubs or organizations: Never    Relationship status: Married  . Intimate partner violence:    Fear of current or ex partner: No    Emotionally abused: No    Physically abused: No    Forced sexual activity: No  Other Topics Concern  . Not on file  Social History Narrative   She is currently unemployed.  She was terminated on 06/15/2013.  She was previously a Loss adjuster, chartered, worked at [employer], and worked in Charity fundraiser for 25 years.   She lives with husband.  They have two children.    FAMILY HISTORY:  Family History  Problem Relation Age of Onset  . Heart failure Father        Died, 78  . Hypertension Mother        Living, 56  . Breast cancer Mother   . Kidney cancer Mother   . Hypercholesterolemia Son   . Breast cancer Sister   . Pancreatic cancer Brother        passed age 63  . Hypertension Sister   . Colon cancer Neg Hx     CURRENT MEDICATIONS:  Outpatient Encounter Medications as of 12/14/2017  Medication Sig Note  . albuterol (PROVENTIL HFA;VENTOLIN HFA) 108 (90 Base) MCG/ACT inhaler Inhale 2 puffs into the lungs every 6 (six) hours as needed for wheezing or shortness of breath.   . Artificial Tear Solution (SOOTHE XP) SOLN Apply 1 drop to eye daily as needed (dry eyes).   . canagliflozin (INVOKANA) 300 MG TABS tablet Take 300 mg daily before breakfast by mouth.   . Cinnamon 500 MG capsule Take 500 mg by mouth daily.   Marland Kitchen dicyclomine (BENTYL) 10 MG capsule Take 1 capsule (10 mg total) 4 (four) times daily -  before meals and at bedtime by mouth. For loose stool and abdominal crmaping   . DULoxetine (CYMBALTA) 60 MG capsule Take 60 mg daily by mouth.    . fluticasone (FLONASE) 50 MCG/ACT nasal spray Place 2 sprays into both nostrils daily as needed for allergies or rhinitis.   . furosemide (LASIX) 20 MG tablet Take 20 mg by mouth  daily as needed for fluid or edema.  11/11/2017: Only takes rarely, when she feels like her hands or feet are swollen.  . gabapentin (NEURONTIN) 300 MG capsule Take 600 mg by mouth 3 (three) times daily.   Marland Kitchen Ketorolac Tromethamine (TORADOL IJ) Inject as directed every 6 (six) weeks. Gets at dr office   . lidocaine (LINDAMANTLE) 3 % CREA cream Apply 1 application topically as needed (joint pain).    . metFORMIN (GLUCOPHAGE) 1000 MG tablet Take 1,000 mg by mouth 2 (two) times  daily with a meal.   . pantoprazole (PROTONIX) 40 MG tablet TAKE ONE TABLET BY MOUTH DAILY 30 MINUTES PRIOR TO BREAKFAST.   Marland Kitchen traMADol (ULTRAM) 50 MG tablet Take 50-100 mg by mouth every 6 (six) hours as needed for moderate pain (depends on pain level if takes  1-2 tablets).    . zolpidem (AMBIEN) 10 MG tablet Take 5-10 mg by mouth at bedtime as needed for sleep (depends on insomnia if takes 0.5-1 tablet).     No facility-administered encounter medications on file as of 12/14/2017.     ALLERGIES:  No Known Allergies   PHYSICAL EXAM:  ECOG Performance status: 1  Vitals:   12/14/17 1411  BP: 125/61  Pulse: 95  Resp: 18  Temp: 98.7 F (37.1 C)  SpO2: 98%   Filed Weights   12/14/17 1411  Weight: 210 lb 6.4 oz (95.4 kg)      LABORATORY DATA:  I have reviewed the labs as listed.  CBC    Component Value Date/Time   WBC 12.1 (H) 11/11/2017 1229   RBC 5.22 (H) 11/11/2017 1229   HGB 14.7 11/11/2017 1229   HCT 46.8 (H) 11/11/2017 1229   PLT 381 11/11/2017 1229   MCV 89.7 11/11/2017 1229   MCH 28.2 11/11/2017 1229   MCHC 31.4 11/11/2017 1229   RDW 15.4 11/11/2017 1229   LYMPHSABS 2.8 11/11/2017 1229   MONOABS 0.6 11/11/2017 1229   EOSABS 0.2 11/11/2017 1229   BASOSABS 0.1 11/11/2017 1229   CMP Latest Ref Rng & Units 07/27/2017 06/07/2014  Glucose 65 - 99 mg/dL 185(H) 145(H)  BUN 6 - 20 mg/dL 17 18  Creatinine 0.44 - 1.00 mg/dL 1.01(H) 0.97  Sodium 135 - 145 mmol/L 135 140  Potassium 3.5 - 5.1 mmol/L 4.5  5.1  Chloride 101 - 111 mmol/L 103 100  CO2 22 - 32 mmol/L 23 27  Calcium 8.9 - 10.3 mg/dL 9.1 9.4        ASSESSMENT & PLAN:   Leukocytosis 1.  Neutrophilic leukocytosis: -Repeat CBC showed mild elevated white count of 12.1, predominantly neutrophils.  Platelet count has normalized.  LDH was normal.  She has intact spleen.  She quit smoking in 2014.  She denies any active infections.  She is not on steroids. - Jak 2 and BCR/ABL were also tested negative, ruling out myeloproliferative disorders. - We talked about close observation of the blood counts.  If there is any significant changes, we will will consider doing a bone marrow aspiration and biopsy.  She is agreeable to this plan.  We will see her back in 4 months with repeat CBC.      Orders placed this encounter:  Orders Placed This Encounter  Procedures  . CBC with Differential  . Lactate dehydrogenase      Derek Jack, MD [place] [phone]

## 2017-12-14 NOTE — Assessment & Plan Note (Signed)
1.  Neutrophilic leukocytosis: -Repeat CBC showed mild elevated white count of 12.1, predominantly neutrophils.  Platelet count has normalized.  LDH was normal.  She has intact spleen.  She quit smoking in 2014.  She denies any active infections.  She is not on steroids. - Jak 2 and BCR/ABL were also tested negative, ruling out myeloproliferative disorders. - We talked about close observation of the blood counts.  If there is any significant changes, we will will consider doing a bone marrow aspiration and biopsy.  She is agreeable to this plan.  We will see her back in 4 months with repeat CBC.

## 2018-01-03 DIAGNOSIS — J209 Acute bronchitis, unspecified: Secondary | ICD-10-CM | POA: Diagnosis not present

## 2018-01-03 DIAGNOSIS — E1165 Type 2 diabetes mellitus with hyperglycemia: Secondary | ICD-10-CM | POA: Diagnosis not present

## 2018-01-03 DIAGNOSIS — J22 Unspecified acute lower respiratory infection: Secondary | ICD-10-CM | POA: Diagnosis not present

## 2018-01-03 DIAGNOSIS — Z1389 Encounter for screening for other disorder: Secondary | ICD-10-CM | POA: Diagnosis not present

## 2018-01-14 ENCOUNTER — Other Ambulatory Visit: Payer: Self-pay | Admitting: Gastroenterology

## 2018-01-19 ENCOUNTER — Ambulatory Visit: Payer: Medicare Other | Admitting: Gastroenterology

## 2018-01-19 ENCOUNTER — Encounter: Payer: Self-pay | Admitting: Gastroenterology

## 2018-01-19 VITALS — BP 132/88 | HR 97 | Temp 97.2°F | Ht 62.0 in | Wt 207.4 lb

## 2018-01-19 DIAGNOSIS — Z860100 Personal history of colon polyps, unspecified: Secondary | ICD-10-CM

## 2018-01-19 DIAGNOSIS — K219 Gastro-esophageal reflux disease without esophagitis: Secondary | ICD-10-CM | POA: Diagnosis not present

## 2018-01-19 DIAGNOSIS — R197 Diarrhea, unspecified: Secondary | ICD-10-CM

## 2018-01-19 MED ORDER — PANTOPRAZOLE SODIUM 40 MG PO TBEC: 40.0000 mg | DELAYED_RELEASE_TABLET | Freq: Every day | ORAL | 3 refills | Status: DC

## 2018-01-19 NOTE — Progress Notes (Signed)
Primary Care Physician:  [name], MD  Primary GI: Dr. Oneida Alar   Chief Complaint  Patient presents with  . Diarrhea    improved. Has 1-2 episodes per week now    HPI:   CEIRRA BELLI is a 57 y.o. female presenting today with a history of GERD and diarrhea. Jan 2019 completed colonoscopy with simple adenomas, normal colonic biopsies. Needs surveillance in 3 years due to history of polyps (with Propofol). Returns today in routine follow-up.   Bentyl 2-3 times a day. Diarrhea not as bad as it was. Episode 1-2 times a week. Remains on metformin. Protonix 30 minutes before breakfast. Controls GERD symptoms. Hasn't had heartburn since starting taking it. No other concerns.   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 Followed closely.    Past Medical History:  Diagnosis Date  . Apnea    Poss OSA  . Arthralgia   . Arthritis   . Depression   . Diabetes mellitus without complication ([place])   . Fatigue   . Fibromyalgia   . GERD (gastroesophageal reflux disease)   . IBS (irritable bowel syndrome)   . Myalgia   . Pinched nerve    lumbar-l5-s1  . PONV (postoperative nausea and vomiting)   . Snoring     Past Surgical History:  Procedure Laterality Date  . ARTHROSCOPIC REPAIR ACL Left   . BIOPSY N/A 06/12/2014   Procedure: BIOPSY;  Surgeon: Danie Binder, MD;  Location: [facility];  Service: Endoscopy;  Laterality: N/A;  . BIOPSY  09/14/2017   Procedure: BIOPSY;  Surgeon: Danie Binder, MD;  [facility];  Service: Endoscopy;;  colon  . CARPAL TUNNEL RELEASE Right   . CHOLECYSTECTOMY  1990s  . CLUB FOOT RELEASE Left    x3  . COLONOSCOPY WITH PROPOFOL N/A 06/12/2014   Dr. Suzette Battiest colon polyps removed/moderate divertiiculosis/small internal hemorrhoids/moderate sized external hemorrhoids. tubular adenomas. Next surveillance in Oct 2018.   Marland Kitchen COLONOSCOPY WITH PROPOFOL N/A 09/14/2017   Dr. Oneida Alar: simple adenomas, moderate diverticulosis in rectosigmoid  colon, sigmoid colon, and descending colon. External and internal hemorrhoids, normal colon biopsies. simple adenomas. Needs surveillance in 3 years with Propofol, colowrap  . ESOPHAGOGASTRODUODENOSCOPY (EGD) WITH PROPOFOL N/A 06/12/2014   Dr. 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 benign gastric and duodenal biopsies  . POLYPECTOMY N/A 06/12/2014   Procedure: POLYPECTOMY;  Surgeon: Danie Binder, MD;  Location: [facility];  Service: Endoscopy;  Laterality: N/A;  . POLYPECTOMY  09/14/2017   Procedure: POLYPECTOMY;  Surgeon: Danie Binder, MD;  [facility];  Service: Endoscopy;;  colon  . SAVORY DILATION N/A 06/12/2014   Procedure: SAVORY DILATION;  Surgeon: Danie Binder, MD;  Location: [facility];  Service: Endoscopy;  Laterality: N/A;  12.8-17    Current Outpatient Medications  Medication Sig Dispense Refill  . albuterol (PROVENTIL HFA;VENTOLIN HFA) 108 (90 Base) MCG/ACT inhaler Inhale 2 puffs into the lungs every 6 (six) hours as needed for wheezing or shortness of breath.    . Artificial Tear Solution (SOOTHE XP) SOLN Apply 1 drop to eye daily as needed (dry eyes).    . canagliflozin (INVOKANA) 300 MG TABS tablet Take 300 mg daily before breakfast by mouth.    . Cinnamon 500 MG capsule Take 500 mg by mouth daily.    Marland Kitchen dicyclomine (BENTYL) 10 MG capsule Take 1 capsule (10 mg total) 4 (four) times daily -  before meals and at bedtime  by mouth. For loose stool and abdominal crmaping 120 capsule 11  . DULoxetine (CYMBALTA) 60 MG capsule Take 60 mg daily by mouth.     . fluticasone (FLONASE) 50 MCG/ACT nasal spray Place 2 sprays into both nostrils daily as needed for allergies or rhinitis.    . furosemide (LASIX) 20 MG tablet Take 20 mg by mouth daily as needed for fluid or edema.     . gabapentin (NEURONTIN) 300 MG capsule Take 600 mg by mouth 3 (three) times daily.    Marland Kitchen Ketorolac Tromethamine (TORADOL IJ) Inject as directed every 6 (six) weeks.  Gets at dr office    . lidocaine (LINDAMANTLE) 3 % CREA cream Apply 1 application topically as needed (joint pain).   4  . metFORMIN (GLUCOPHAGE) 1000 MG tablet Take 1,000 mg by mouth 2 (two) times daily with a meal.    . pantoprazole (PROTONIX) 40 MG tablet Take 1 tablet (40 mg total) by mouth daily before breakfast. 90 tablet 3  . traMADol (ULTRAM) 50 MG tablet Take 50-100 mg by mouth every 6 (six) hours as needed for moderate pain (depends on pain level if takes  1-2 tablets).     . zolpidem (AMBIEN) 10 MG tablet Take 5-10 mg by mouth at bedtime as needed for sleep (depends on insomnia if takes 0.5-1 tablet).      No current facility-administered medications for this visit.     Allergies as of 01/19/2018  . (No Known Allergies)    Family History  Problem Relation Age of Onset  . Heart failure Father        Died, 78  . Hypertension Mother        Living, 85  . Breast cancer Mother   . Kidney cancer Mother   . Hypercholesterolemia Son   . Breast cancer Sister   . Pancreatic cancer Brother        passed age 58  . Hypertension Sister   . Colon cancer Neg Hx     Social History   Socioeconomic History  . Marital status: Married    Spouse name: Marcello Moores  . Number of children: 2  . Years of education: Not on file  . Highest education level: Associate degree: occupational, Hotel manager, or vocational program  Occupational History  . Occupation: disabled  Social Needs  . Financial resource strain: Not hard at all  . Food insecurity:    Worry: Never true    Inability: Never true  . Transportation needs:    Medical: No    Non-medical: No  Tobacco Use  . Smoking status: Former Smoker    Packs/day: 0.50    Years: 20.00    Pack years: 10.00    Types: Cigarettes    Last attempt to quit: 10/18/2011    Years since quitting: 6.2  . Smokeless tobacco: Never Used  . Tobacco comment: quit in 2014  Substance and Sexual Activity  . Alcohol use: No    Frequency: Never  . Drug use: No    . Sexual activity: Yes    Birth control/protection: Post-menopausal  Lifestyle  . Physical activity:    Days per week: 1 day    Minutes per session: 20 min  . Stress: Not at all  Relationships  . Social connections:    Talks on phone: More than three times a week    Gets together: Once a week    Attends religious service: 1 to 4 times per year    Active member of  club or organization: No    Attends meetings of clubs or organizations: Never    Relationship status: Married  Other Topics Concern  . Not on file  Social History Narrative   She is currently unemployed.  She was terminated on 06/15/2013.  She was previously a Loss adjuster, chartered, worked at [employer], and worked in Charity fundraiser for 25 years.   She lives with husband.  They have two children.    Review of Systems: Gen: Denies fever, chills, anorexia. Denies fatigue, weakness, weight loss.  CV: Denies chest pain, palpitations, syncope, peripheral edema, and claudication. Resp: Denies dyspnea at rest, cough, wheezing, coughing up blood, and pleurisy. GI: see HPI  Derm: Denies rash, itching, dry skin Psych: Denies depression, anxiety, memory loss, confusion. No homicidal or suicidal ideation.  Heme: Denies bruising, bleeding, and enlarged lymph nodes.  Physical Exam: BP 132/88   Pulse 97   Temp (!) 97.2 F (36.2 C) (Oral)   Ht 5\' 2"  (1.575 m)   Wt 207 lb 6.4 oz (94.1 kg)   BMI 37.93 kg/m  General:   Alert and oriented. No distress noted. Pleasant and cooperative.  Head:  Normocephalic and atraumatic. Eyes:  Conjuctiva clear without scleral icterus. Mouth:  Oral mucosa pink and moist.  Abdomen:  +BS, soft, non-tender and non-distended. No rebound or guarding. No HSM or masses noted. Msk:  Symmetrical without gross deformities. Normal posture. Extremities:  Without edema. Neurologic:  Alert and  oriented x4 Psych:  Alert and cooperative. Normal mood and affect.

## 2018-01-19 NOTE — Assessment & Plan Note (Signed)
Negative colonic biopsies recently. Likely multifactorial in setting of metformin, diabetes, cholecystectomy state. Improved on Bentyl, 2-3 times a day. Return in 6-8 months.

## 2018-01-19 NOTE — Assessment & Plan Note (Signed)
Due for surveillance in 2022.

## 2018-01-19 NOTE — Patient Instructions (Signed)
I am glad you are doing well! I sent refills for Protonix. You should have plenty of refills for Bentyl.  We will see you in 6-8 months!  Your next colonoscopy will be in 2022.  Please call if any concerns in the meantime!  It was a pleasure to see you today. I strive to create trusting relationships with patients to provide genuine, compassionate, and quality care. I value your feedback. If you receive a survey regarding your visit,  I greatly appreciate you taking time to fill this out.   Annitta Needs, PhD, ANP-BC [facility]

## 2018-01-19 NOTE — Assessment & Plan Note (Signed)
Doing well on Protonix once daily, no alarm symptoms. Refills sent. Return in 6-8 months.

## 2018-01-19 NOTE — Progress Notes (Signed)
CC'ED TO PCP 

## 2018-03-14 DIAGNOSIS — I10 Essential (primary) hypertension: Secondary | ICD-10-CM | POA: Diagnosis not present

## 2018-03-14 DIAGNOSIS — E1165 Type 2 diabetes mellitus with hyperglycemia: Secondary | ICD-10-CM | POA: Diagnosis not present

## 2018-03-14 DIAGNOSIS — G894 Chronic pain syndrome: Secondary | ICD-10-CM | POA: Diagnosis not present

## 2018-03-14 DIAGNOSIS — M797 Fibromyalgia: Secondary | ICD-10-CM | POA: Diagnosis not present

## 2018-03-14 DIAGNOSIS — Z0001 Encounter for general adult medical examination with abnormal findings: Secondary | ICD-10-CM | POA: Diagnosis not present

## 2018-04-05 ENCOUNTER — Other Ambulatory Visit (HOSPITAL_COMMUNITY): Payer: Self-pay

## 2018-04-05 DIAGNOSIS — D473 Essential (hemorrhagic) thrombocythemia: Secondary | ICD-10-CM

## 2018-04-05 DIAGNOSIS — D75839 Thrombocytosis, unspecified: Secondary | ICD-10-CM

## 2018-04-08 ENCOUNTER — Inpatient Hospital Stay (HOSPITAL_COMMUNITY): Payer: Medicare Other | Attending: Hematology

## 2018-04-08 DIAGNOSIS — D72825 Bandemia: Secondary | ICD-10-CM

## 2018-04-08 DIAGNOSIS — D72828 Other elevated white blood cell count: Secondary | ICD-10-CM | POA: Insufficient documentation

## 2018-04-08 LAB — CBC WITH DIFFERENTIAL/PLATELET
BASOS ABS: 0.1 10*3/uL (ref 0.0–0.1)
Basophils Relative: 1 %
Eosinophils Absolute: 0.2 10*3/uL (ref 0.0–0.7)
Eosinophils Relative: 2 %
HCT: 43.3 % (ref 36.0–46.0)
HEMOGLOBIN: 13.9 g/dL (ref 12.0–15.0)
Lymphocytes Relative: 25 %
Lymphs Abs: 2.3 10*3/uL (ref 0.7–4.0)
MCH: 29 pg (ref 26.0–34.0)
MCHC: 32.1 g/dL (ref 30.0–36.0)
MCV: 90.4 fL (ref 78.0–100.0)
Monocytes Absolute: 0.6 10*3/uL (ref 0.1–1.0)
Monocytes Relative: 6 %
NEUTROS PCT: 66 %
Neutro Abs: 6.3 10*3/uL (ref 1.7–7.7)
PLATELETS: 363 10*3/uL (ref 150–400)
RBC: 4.79 MIL/uL (ref 3.87–5.11)
RDW: 15.3 % (ref 11.5–15.5)
WBC: 9.5 10*3/uL (ref 4.0–10.5)

## 2018-04-08 LAB — LACTATE DEHYDROGENASE: LDH: 149 U/L (ref 98–192)

## 2018-04-15 ENCOUNTER — Ambulatory Visit (HOSPITAL_COMMUNITY): Payer: Medicare Other | Admitting: Hematology

## 2018-05-02 ENCOUNTER — Other Ambulatory Visit (HOSPITAL_COMMUNITY): Payer: Self-pay | Admitting: Family Medicine

## 2018-05-02 DIAGNOSIS — Z1231 Encounter for screening mammogram for malignant neoplasm of breast: Secondary | ICD-10-CM

## 2018-05-05 ENCOUNTER — Ambulatory Visit (HOSPITAL_COMMUNITY)
Admission: RE | Admit: 2018-05-05 | Discharge: 2018-05-05 | Disposition: A | Discharge status: Home / Self Care | Payer: Medicare Other | Source: Ambulatory Visit | Attending: Family Medicine | Admitting: Family Medicine

## 2018-05-05 DIAGNOSIS — Z1231 Encounter for screening mammogram for malignant neoplasm of breast: Secondary | ICD-10-CM

## 2018-05-19 DIAGNOSIS — M797 Fibromyalgia: Secondary | ICD-10-CM | POA: Diagnosis not present

## 2018-05-19 DIAGNOSIS — G894 Chronic pain syndrome: Secondary | ICD-10-CM | POA: Diagnosis not present

## 2018-05-19 DIAGNOSIS — Z1389 Encounter for screening for other disorder: Secondary | ICD-10-CM | POA: Diagnosis not present

## 2018-05-19 DIAGNOSIS — Z23 Encounter for immunization: Secondary | ICD-10-CM | POA: Diagnosis not present

## 2018-05-30 DIAGNOSIS — M797 Fibromyalgia: Secondary | ICD-10-CM | POA: Diagnosis not present

## 2018-05-30 DIAGNOSIS — M7702 Medial epicondylitis, left elbow: Secondary | ICD-10-CM | POA: Diagnosis not present

## 2018-06-13 ENCOUNTER — Encounter: Payer: Self-pay | Admitting: Adult Health

## 2018-06-13 ENCOUNTER — Other Ambulatory Visit: Payer: Self-pay

## 2018-06-13 ENCOUNTER — Ambulatory Visit: Payer: Medicare Other | Admitting: Adult Health

## 2018-06-13 ENCOUNTER — Encounter (INDEPENDENT_AMBULATORY_CARE_PROVIDER_SITE_OTHER): Payer: Self-pay

## 2018-06-13 ENCOUNTER — Other Ambulatory Visit (HOSPITAL_COMMUNITY)
Admission: RE | Admit: 2018-06-13 | Discharge: 2018-06-13 | Disposition: A | Discharge status: Home / Self Care | Payer: Medicare Other | Source: Ambulatory Visit | Attending: Adult Health | Admitting: Adult Health

## 2018-06-13 VITALS — BP 143/81 | HR 87 | Resp 20 | Ht 60.0 in | Wt 209.0 lb

## 2018-06-13 DIAGNOSIS — Z01419 Encounter for gynecological examination (general) (routine) without abnormal findings: Secondary | ICD-10-CM

## 2018-06-13 DIAGNOSIS — Z124 Encounter for screening for malignant neoplasm of cervix: Secondary | ICD-10-CM | POA: Insufficient documentation

## 2018-06-13 DIAGNOSIS — Z1211 Encounter for screening for malignant neoplasm of colon: Secondary | ICD-10-CM

## 2018-06-13 DIAGNOSIS — Z1212 Encounter for screening for malignant neoplasm of rectum: Secondary | ICD-10-CM

## 2018-06-13 LAB — HEMOCCULT GUIAC POC 1CARD (OFFICE): Fecal Occult Blood, POC: NEGATIVE

## 2018-06-13 NOTE — Progress Notes (Signed)
Patient ID: Erica Price, female   DOB: 10-Dec-1960, 57 y.o.   MRN: [id number] History of Present Illness:  Erica Price is a 57 year old white female, married, PM in for well woman gyn exam and pap.She is on disability now. PCP Is Dr Hilma Favors.  Current Medications, Allergies, Past Medical History, Past Surgical History, Family History and Social History were reviewed in [employer] record.     Review of Systems: Patient denies any headaches, hearing loss, fatigue, blurred vision, shortness of breath, chest pain, abdominal pain, problems with bowel movements(has diarrhea some days, ?related to metformin), urination, or intercourse(not having sex). No mood swings.+body aches. +sweats, ?related to meds    Physical Exam:BP (!) 143/81 (BP Location: Right Arm, Patient Position: Sitting, Cuff Size: Normal)   Pulse 87   Resp 20   Ht 5' (1.524 m)   Wt 209 lb (94.8 kg)   BMI 40.82 kg/m  General:  Well developed, well nourished, no acute distress Skin:  Warm and dry Neck:  Midline trachea, normal thyroid, good ROM, no lymphadenopathy Lungs; Clear to auscultation bilaterally Breast:  No dominant palpable mass, retraction, or nipple discharge Cardiovascular: Regular rate and rhythm Abdomen:  Soft, non tender, no hepatosplenomegaly Pelvic:  External genitalia is normal in appearance, no lesions.  The vagina is pale, with loss of moisture and rugae. Urethra has no lesions or masses. The cervix is smooth, pap with HPV performed.  Uterus is felt to be normal size, shape, and contour.  No adnexal masses or tenderness noted.Bladder is non tender, no masses felt. Rectal: Good sphincter tone, no polyps, or hemorrhoids felt.  Hemoccult negative. 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  Psych:  No mood changes, alert and cooperative,seems happy PHQ 9 score 10, denies being suicidal or homicidal,  and is on meds.,  some days worse than others, because she can't do what she wants to do. Examination chaperoned by Shela Nevin RN.  Impression: 1. Encounter for gynecological examination with Papanicolaou smear of cervix   2. Routine cervical smear   3. Screening for colorectal cancer       Plan: Pap with HPV sent Pap and physical in 3 years, if pap normal Labs with PCP Mammogram yearly Colonoscopy per Dr Oneida Alar, every 3 years, Irelynn said

## 2018-06-15 LAB — CYTOLOGY - PAP
Diagnosis: NEGATIVE
HPV: NOT DETECTED

## 2018-06-16 NOTE — Progress Notes (Signed)
REVIEWED.  

## 2018-06-17 DIAGNOSIS — M9903 Segmental and somatic dysfunction of lumbar region: Secondary | ICD-10-CM | POA: Diagnosis not present

## 2018-06-17 DIAGNOSIS — M9902 Segmental and somatic dysfunction of thoracic region: Secondary | ICD-10-CM | POA: Diagnosis not present

## 2018-06-17 DIAGNOSIS — M9905 Segmental and somatic dysfunction of pelvic region: Secondary | ICD-10-CM | POA: Diagnosis not present

## 2018-06-17 DIAGNOSIS — M5441 Lumbago with sciatica, right side: Secondary | ICD-10-CM | POA: Diagnosis not present

## 2018-06-20 DIAGNOSIS — M9905 Segmental and somatic dysfunction of pelvic region: Secondary | ICD-10-CM | POA: Diagnosis not present

## 2018-06-20 DIAGNOSIS — M9903 Segmental and somatic dysfunction of lumbar region: Secondary | ICD-10-CM | POA: Diagnosis not present

## 2018-06-20 DIAGNOSIS — M5441 Lumbago with sciatica, right side: Secondary | ICD-10-CM | POA: Diagnosis not present

## 2018-06-20 DIAGNOSIS — M9902 Segmental and somatic dysfunction of thoracic region: Secondary | ICD-10-CM | POA: Diagnosis not present

## 2018-06-27 DIAGNOSIS — M9903 Segmental and somatic dysfunction of lumbar region: Secondary | ICD-10-CM | POA: Diagnosis not present

## 2018-06-27 DIAGNOSIS — M9905 Segmental and somatic dysfunction of pelvic region: Secondary | ICD-10-CM | POA: Diagnosis not present

## 2018-06-27 DIAGNOSIS — M9902 Segmental and somatic dysfunction of thoracic region: Secondary | ICD-10-CM | POA: Diagnosis not present

## 2018-06-27 DIAGNOSIS — M5441 Lumbago with sciatica, right side: Secondary | ICD-10-CM | POA: Diagnosis not present

## 2018-06-29 DIAGNOSIS — M5441 Lumbago with sciatica, right side: Secondary | ICD-10-CM | POA: Diagnosis not present

## 2018-06-29 DIAGNOSIS — M9905 Segmental and somatic dysfunction of pelvic region: Secondary | ICD-10-CM | POA: Diagnosis not present

## 2018-06-29 DIAGNOSIS — M9903 Segmental and somatic dysfunction of lumbar region: Secondary | ICD-10-CM | POA: Diagnosis not present

## 2018-06-29 DIAGNOSIS — M9902 Segmental and somatic dysfunction of thoracic region: Secondary | ICD-10-CM | POA: Diagnosis not present

## 2018-07-01 DIAGNOSIS — M5441 Lumbago with sciatica, right side: Secondary | ICD-10-CM | POA: Diagnosis not present

## 2018-07-01 DIAGNOSIS — M9905 Segmental and somatic dysfunction of pelvic region: Secondary | ICD-10-CM | POA: Diagnosis not present

## 2018-07-01 DIAGNOSIS — M9903 Segmental and somatic dysfunction of lumbar region: Secondary | ICD-10-CM | POA: Diagnosis not present

## 2018-07-01 DIAGNOSIS — M9902 Segmental and somatic dysfunction of thoracic region: Secondary | ICD-10-CM | POA: Diagnosis not present

## 2018-07-04 DIAGNOSIS — M9903 Segmental and somatic dysfunction of lumbar region: Secondary | ICD-10-CM | POA: Diagnosis not present

## 2018-07-04 DIAGNOSIS — M9905 Segmental and somatic dysfunction of pelvic region: Secondary | ICD-10-CM | POA: Diagnosis not present

## 2018-07-04 DIAGNOSIS — M9902 Segmental and somatic dysfunction of thoracic region: Secondary | ICD-10-CM | POA: Diagnosis not present

## 2018-07-04 DIAGNOSIS — M5441 Lumbago with sciatica, right side: Secondary | ICD-10-CM | POA: Diagnosis not present

## 2018-07-06 DIAGNOSIS — M9902 Segmental and somatic dysfunction of thoracic region: Secondary | ICD-10-CM | POA: Diagnosis not present

## 2018-07-06 DIAGNOSIS — M9905 Segmental and somatic dysfunction of pelvic region: Secondary | ICD-10-CM | POA: Diagnosis not present

## 2018-07-06 DIAGNOSIS — M5441 Lumbago with sciatica, right side: Secondary | ICD-10-CM | POA: Diagnosis not present

## 2018-07-06 DIAGNOSIS — M9903 Segmental and somatic dysfunction of lumbar region: Secondary | ICD-10-CM | POA: Diagnosis not present

## 2018-07-08 DIAGNOSIS — M5441 Lumbago with sciatica, right side: Secondary | ICD-10-CM | POA: Diagnosis not present

## 2018-07-08 DIAGNOSIS — M9903 Segmental and somatic dysfunction of lumbar region: Secondary | ICD-10-CM | POA: Diagnosis not present

## 2018-07-08 DIAGNOSIS — M9905 Segmental and somatic dysfunction of pelvic region: Secondary | ICD-10-CM | POA: Diagnosis not present

## 2018-07-08 DIAGNOSIS — M9902 Segmental and somatic dysfunction of thoracic region: Secondary | ICD-10-CM | POA: Diagnosis not present

## 2018-07-13 DIAGNOSIS — M9905 Segmental and somatic dysfunction of pelvic region: Secondary | ICD-10-CM | POA: Diagnosis not present

## 2018-07-13 DIAGNOSIS — M9902 Segmental and somatic dysfunction of thoracic region: Secondary | ICD-10-CM | POA: Diagnosis not present

## 2018-07-13 DIAGNOSIS — M5441 Lumbago with sciatica, right side: Secondary | ICD-10-CM | POA: Diagnosis not present

## 2018-07-13 DIAGNOSIS — M9903 Segmental and somatic dysfunction of lumbar region: Secondary | ICD-10-CM | POA: Diagnosis not present

## 2018-07-18 DIAGNOSIS — M9903 Segmental and somatic dysfunction of lumbar region: Secondary | ICD-10-CM | POA: Diagnosis not present

## 2018-07-18 DIAGNOSIS — M5441 Lumbago with sciatica, right side: Secondary | ICD-10-CM | POA: Diagnosis not present

## 2018-07-18 DIAGNOSIS — M9905 Segmental and somatic dysfunction of pelvic region: Secondary | ICD-10-CM | POA: Diagnosis not present

## 2018-07-18 DIAGNOSIS — M9902 Segmental and somatic dysfunction of thoracic region: Secondary | ICD-10-CM | POA: Diagnosis not present

## 2018-07-19 DIAGNOSIS — I10 Essential (primary) hypertension: Secondary | ICD-10-CM | POA: Diagnosis not present

## 2018-07-19 DIAGNOSIS — E782 Mixed hyperlipidemia: Secondary | ICD-10-CM | POA: Diagnosis not present

## 2018-07-19 DIAGNOSIS — M797 Fibromyalgia: Secondary | ICD-10-CM | POA: Diagnosis not present

## 2018-07-19 DIAGNOSIS — E1165 Type 2 diabetes mellitus with hyperglycemia: Secondary | ICD-10-CM | POA: Diagnosis not present

## 2018-07-25 DIAGNOSIS — M9905 Segmental and somatic dysfunction of pelvic region: Secondary | ICD-10-CM | POA: Diagnosis not present

## 2018-07-25 DIAGNOSIS — M9903 Segmental and somatic dysfunction of lumbar region: Secondary | ICD-10-CM | POA: Diagnosis not present

## 2018-07-25 DIAGNOSIS — M5441 Lumbago with sciatica, right side: Secondary | ICD-10-CM | POA: Diagnosis not present

## 2018-07-25 DIAGNOSIS — M9902 Segmental and somatic dysfunction of thoracic region: Secondary | ICD-10-CM | POA: Diagnosis not present

## 2018-08-01 ENCOUNTER — Ambulatory Visit: Payer: Medicare Other | Admitting: Gastroenterology

## 2018-08-06 DIAGNOSIS — E119 Type 2 diabetes mellitus without complications: Secondary | ICD-10-CM | POA: Diagnosis not present

## 2018-08-06 DIAGNOSIS — H40033 Anatomical narrow angle, bilateral: Secondary | ICD-10-CM | POA: Diagnosis not present

## 2018-08-08 ENCOUNTER — Other Ambulatory Visit: Payer: Self-pay | Admitting: Gastroenterology

## 2018-08-08 DIAGNOSIS — M9902 Segmental and somatic dysfunction of thoracic region: Secondary | ICD-10-CM | POA: Diagnosis not present

## 2018-08-08 DIAGNOSIS — M9903 Segmental and somatic dysfunction of lumbar region: Secondary | ICD-10-CM | POA: Diagnosis not present

## 2018-08-08 DIAGNOSIS — M5441 Lumbago with sciatica, right side: Secondary | ICD-10-CM | POA: Diagnosis not present

## 2018-08-08 DIAGNOSIS — M9905 Segmental and somatic dysfunction of pelvic region: Secondary | ICD-10-CM | POA: Diagnosis not present

## 2018-09-01 DIAGNOSIS — G894 Chronic pain syndrome: Secondary | ICD-10-CM | POA: Diagnosis not present

## 2018-09-01 DIAGNOSIS — Z0001 Encounter for general adult medical examination with abnormal findings: Secondary | ICD-10-CM | POA: Diagnosis not present

## 2018-09-09 DIAGNOSIS — M9903 Segmental and somatic dysfunction of lumbar region: Secondary | ICD-10-CM | POA: Diagnosis not present

## 2018-09-09 DIAGNOSIS — M5441 Lumbago with sciatica, right side: Secondary | ICD-10-CM | POA: Diagnosis not present

## 2018-09-09 DIAGNOSIS — M9905 Segmental and somatic dysfunction of pelvic region: Secondary | ICD-10-CM | POA: Diagnosis not present

## 2018-09-09 DIAGNOSIS — M9902 Segmental and somatic dysfunction of thoracic region: Secondary | ICD-10-CM | POA: Diagnosis not present

## 2018-10-03 DIAGNOSIS — E1165 Type 2 diabetes mellitus with hyperglycemia: Secondary | ICD-10-CM | POA: Diagnosis not present

## 2018-10-03 DIAGNOSIS — M797 Fibromyalgia: Secondary | ICD-10-CM | POA: Diagnosis not present

## 2018-10-03 DIAGNOSIS — I10 Essential (primary) hypertension: Secondary | ICD-10-CM | POA: Diagnosis not present

## 2018-10-03 DIAGNOSIS — G894 Chronic pain syndrome: Secondary | ICD-10-CM | POA: Diagnosis not present

## 2018-10-19 NOTE — Progress Notes (Signed)
Referring Provider: [name], MD Primary Care Physician:  [name], MD Primary GI: Dr. Oneida Alar   Chief Complaint  Patient presents with  . Diarrhea    better since stopped Metformin    HPI:   Erica Price is a 58 y.o. female presenting today with a history of GERD and diarrhea. Jan 2019 completed colonoscopy with simple adenomas, normal colonic biopsies. Needs surveillance in 2022 due to history of polyps (with Propofol). 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 Has done well with Bentyl for looser stool, which is multifactorial in setting of metformin, diabetes, post-cholecystectomy state. Returning for routine follow-up.   Was taken off metformin 3 weeks ago. Now on Trulicity. Notes a marked improvement. Had been having severe diarrhea, urgency, incontinence. Has 1-2 soft BMs a day. No diarrhea or watery stools. Only takes dicyclomine if occasional cramping. Now as needed.   GERD controlled with Protonix. Occasional strangling. Has lots of sinus drainage.Taking Protonix first thing in the morning on an empty stomach.   Past Medical History:  Diagnosis Date  . Apnea    Poss OSA  . Arthralgia   . Arthritis   . Depression   . Diabetes mellitus without complication ([place])   . Fatigue   . Fibromyalgia   . GERD (gastroesophageal reflux disease)   . IBS (irritable bowel syndrome)   . Myalgia   . Pinched nerve    lumbar-l5-s1  . PONV (postoperative nausea and vomiting)   . Snoring     Past Surgical History:  Procedure Laterality Date  . ARTHROSCOPIC REPAIR ACL Left   . BIOPSY N/A 06/12/2014   Procedure: BIOPSY;  Surgeon: Danie Binder, MD;  Location: [facility];  Service: Endoscopy;  Laterality: N/A;  . BIOPSY  09/14/2017   Procedure: BIOPSY;  Surgeon: Danie Binder, MD;  [facility];  Service: Endoscopy;;  colon  . CARPAL TUNNEL RELEASE Right   . CHOLECYSTECTOMY  1990s  . CLUB FOOT RELEASE Left    x3  . COLONOSCOPY WITH PROPOFOL N/A  06/12/2014   Dr. Suzette Battiest colon polyps removed/moderate divertiiculosis/small internal hemorrhoids/moderate sized external hemorrhoids. tubular adenomas. Next surveillance in Oct 2018.   Marland Kitchen COLONOSCOPY WITH PROPOFOL N/A 09/14/2017   Dr. Oneida Alar: simple adenomas, moderate diverticulosis in rectosigmoid colon, sigmoid colon, and descending colon. External and internal hemorrhoids, normal colon biopsies. simple adenomas. Needs surveillance in 3 years with Propofol, colowrap  . ESOPHAGOGASTRODUODENOSCOPY (EGD) WITH PROPOFOL N/A 06/12/2014   Dr. 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 benign gastric and duodenal biopsies  . POLYPECTOMY N/A 06/12/2014   Procedure: POLYPECTOMY;  Surgeon: Danie Binder, MD;  Location: [facility];  Service: Endoscopy;  Laterality: N/A;  . POLYPECTOMY  09/14/2017   Procedure: POLYPECTOMY;  Surgeon: Danie Binder, MD;  [facility];  Service: Endoscopy;;  colon  . SAVORY DILATION N/A 06/12/2014   Procedure: SAVORY DILATION;  Surgeon: Danie Binder, MD;  Location: [facility];  Service: Endoscopy;  Laterality: N/A;  12.8-17    Current Outpatient Medications  Medication Sig Dispense Refill  . albuterol (PROVENTIL HFA;VENTOLIN HFA) 108 (90 Base) MCG/ACT inhaler Inhale 2 puffs into the lungs every 6 (six) hours as needed for wheezing or shortness of breath.    Marland Kitchen aluminum chloride (DRYSOL) 20 % external solution Apply topically as needed.     . Artificial Tear Solution (SOOTHE XP) SOLN Apply 1 drop to eye daily as needed (dry eyes).    Marland Kitchen  canagliflozin (INVOKANA) 300 MG TABS tablet Take 300 mg daily before breakfast by mouth.    . Cholecalciferol (VITAMIN D3) 5000 units CAPS     . dicyclomine (BENTYL) 10 MG capsule TAKE 1 CAPSULE BY MOUTH FOUR TIMES DAILY BEFORE MEALS AND AT BEDTIME FOR LOOSE STOOL AND ABDOMINAL CRAMPING. 120 capsule 3  . DULoxetine (CYMBALTA) 60 MG capsule Take 60 mg daily by mouth.     . fluticasone (FLONASE) 50  MCG/ACT nasal spray Place 2 sprays into both nostrils daily as needed for allergies or rhinitis.    . furosemide (LASIX) 20 MG tablet Take 20 mg by mouth daily as needed for fluid or edema.     . gabapentin (NEURONTIN) 300 MG capsule Take 600 mg by mouth 3 (three) times daily.    Marland Kitchen Ketorolac Tromethamine (TORADOL IJ) Inject as directed as needed. Gets at dr office    . lidocaine (LINDAMANTLE) 3 % CREA cream Apply 1 application topically as needed (joint pain).   4  . NYAMYC powder     . pantoprazole (PROTONIX) 40 MG tablet Take 1 tablet (40 mg total) by mouth daily before breakfast. 90 tablet 3  . traMADol (ULTRAM) 50 MG tablet Take 50-100 mg by mouth every 6 (six) hours as needed for moderate pain (depends on pain level if takes  1-2 tablets).     . TRULICITY 6.73 AL/9.3XT SOPN Inject 0.75 mg into the skin once a week.    . zolpidem (AMBIEN) 10 MG tablet Take 5-10 mg by mouth at bedtime as needed for sleep (depends on insomnia if takes 0.5-1 tablet).      No current facility-administered medications for this visit.     Allergies as of 10/21/2018  . (No Known Allergies)    Family History  Problem Relation Age of Onset  . Heart failure Father        Died, 78  . Hypertension Mother        Living, 33  . Breast cancer Mother   . Kidney cancer Mother   . Hypercholesterolemia Son   . Breast cancer Sister   . Pancreatic cancer Brother        passed age 84  . Hypertension Sister   . Colon cancer Neg Hx     Social History   Socioeconomic History  . Marital status: Married    Spouse name: Marcello Moores  . Number of children: 2  . Years of education: Not on file  . Highest education level: Associate degree: occupational, Hotel manager, or vocational program  Occupational History  . Occupation: disabled  Social Needs  . Financial resource strain: Not hard at all  . Food insecurity:    Worry: Never true    Inability: Never true  . Transportation needs:    Medical: No    Non-medical: No    Tobacco Use  . Smoking status: Former Smoker    Packs/day: 0.50    Years: 20.00    Pack years: 10.00    Types: Cigarettes    Last attempt to quit: 10/18/2011    Years since quitting: 7.0  . Smokeless tobacco: Never Used  . Tobacco comment: quit in 2014  Substance and Sexual Activity  . Alcohol use: No    Frequency: Never  . Drug use: No  . Sexual activity: Not Currently    Birth control/protection: Post-menopausal  Lifestyle  . Physical activity:    Days per week: 1 day    Minutes per session: 20 min  . Stress:  Not at all  Relationships  . Social connections:    Talks on phone: More than three times a week    Gets together: Once a week    Attends religious service: 1 to 4 times per year    Active member of club or organization: No    Attends meetings of clubs or organizations: Never    Relationship status: Married  Other Topics Concern  . Not on file  Social History Narrative   She is currently unemployed.  She was terminated on 06/15/2013.  She was previously a Loss adjuster, chartered, worked at [employer], and worked in Charity fundraiser for 25 years.   She lives with husband.  They have two children.    Review of Systems: Gen: Denies fever, chills, anorexia. Denies fatigue, weakness, weight loss.  CV: Denies chest pain, palpitations, syncope, peripheral edema, and claudication. Resp: Denies dyspnea at rest, cough, wheezing, coughing up blood, and pleurisy. GI: see HPI Derm: Denies rash, itching, dry skin Psych: Denies depression, anxiety, memory loss, confusion. No homicidal or suicidal ideation.  Heme: Denies bruising, bleeding, and enlarged lymph nodes.  Physical Exam: BP (!) 153/83   Pulse 84   Temp 98 F (36.7 C) (Oral)   Ht 5\' 2"  (1.575 m)   Wt 204 lb 9.6 oz (92.8 kg)   BMI 37.42 kg/m  General:   Alert and oriented. No distress noted. Pleasant and cooperative.  Head:  Normocephalic and atraumatic. Eyes:  Conjuctiva clear without scleral icterus. Mouth:  Oral mucosa pink  and moist.  Abdomen:  +BS, soft, non-tender and non-distended. No rebound or guarding. No HSM or masses noted. Msk:  Symmetrical without gross deformities. Normal posture. Extremities:  Without edema. Neurologic:  Alert and  oriented x4 Psych:  Alert and cooperative. Normal mood and affect.

## 2018-10-21 ENCOUNTER — Encounter: Payer: Self-pay | Admitting: Gastroenterology

## 2018-10-21 ENCOUNTER — Ambulatory Visit: Payer: Medicare Other | Admitting: Gastroenterology

## 2018-10-21 VITALS — BP 153/83 | HR 84 | Temp 98.0°F | Ht 62.0 in | Wt 204.6 lb

## 2018-10-21 DIAGNOSIS — K219 Gastro-esophageal reflux disease without esophagitis: Secondary | ICD-10-CM | POA: Diagnosis not present

## 2018-10-21 DIAGNOSIS — R197 Diarrhea, unspecified: Secondary | ICD-10-CM | POA: Diagnosis not present

## 2018-10-21 MED ORDER — PANTOPRAZOLE SODIUM 40 MG PO TBEC: 40.0000 mg | DELAYED_RELEASE_TABLET | Freq: Every day | ORAL | 3 refills | Status: DC

## 2018-10-21 NOTE — Assessment & Plan Note (Signed)
Doing well on Protonix once daily. Occasional strangling with liquids but no solid food dysphagia. Monitor for now. Call if worsens or solid food dysphagia; otherwise, return in 1 year.

## 2018-10-21 NOTE — Patient Instructions (Signed)
Continue taking Protonix each morning, 30 minutes before breakfast. Call us if any problems with swallowing food or other concerns.  We will see you back in 1 year!  I enjoyed seeing you again today! As you know, I value our relationship and want to provide genuine, compassionate, and quality care. I welcome your feedback. If you receive a survey regarding your visit,  I greatly appreciate you taking time to fill this out. See you next time!  Annitta Needs, PhD, ANP-BC [facility]

## 2018-10-21 NOTE — Assessment & Plan Note (Signed)
Resolved after discontinuing metformin. Continue dicyclomine prn as needed, which she is using sparingly. Return in 1 year. Next colonoscopy 2022.

## 2018-10-24 NOTE — Progress Notes (Signed)
CC'D TO PCP °

## 2018-11-04 DIAGNOSIS — M545 Low back pain: Secondary | ICD-10-CM | POA: Diagnosis not present

## 2018-11-04 DIAGNOSIS — M9901 Segmental and somatic dysfunction of cervical region: Secondary | ICD-10-CM | POA: Diagnosis not present

## 2018-11-04 DIAGNOSIS — G44219 Episodic tension-type headache, not intractable: Secondary | ICD-10-CM | POA: Diagnosis not present

## 2018-11-04 DIAGNOSIS — M9902 Segmental and somatic dysfunction of thoracic region: Secondary | ICD-10-CM | POA: Diagnosis not present

## 2018-11-04 DIAGNOSIS — M9903 Segmental and somatic dysfunction of lumbar region: Secondary | ICD-10-CM | POA: Diagnosis not present

## 2018-11-14 DIAGNOSIS — G894 Chronic pain syndrome: Secondary | ICD-10-CM | POA: Diagnosis not present

## 2018-11-14 DIAGNOSIS — I10 Essential (primary) hypertension: Secondary | ICD-10-CM | POA: Diagnosis not present

## 2018-11-14 DIAGNOSIS — E1165 Type 2 diabetes mellitus with hyperglycemia: Secondary | ICD-10-CM | POA: Diagnosis not present

## 2018-11-14 DIAGNOSIS — M797 Fibromyalgia: Secondary | ICD-10-CM | POA: Diagnosis not present

## 2018-11-14 DIAGNOSIS — E7849 Other hyperlipidemia: Secondary | ICD-10-CM | POA: Diagnosis not present

## 2018-11-14 LAB — LIPID PANEL
Cholesterol: 227 — AB (ref 0–200)
HDL: 54 (ref 35–70)
LDL Cholesterol: 137
Triglycerides: 178 — AB (ref 40–160)

## 2018-11-14 LAB — MICROALBUMIN, URINE: Microalb, Ur: 10

## 2018-11-14 LAB — BASIC METABOLIC PANEL
BUN: 22 — AB (ref 4–21)
Creatinine: 0.8 (ref 0.5–1.1)

## 2019-01-09 ENCOUNTER — Other Ambulatory Visit: Payer: Self-pay

## 2019-01-09 ENCOUNTER — Encounter: Payer: Self-pay | Admitting: "Endocrinology

## 2019-01-09 ENCOUNTER — Ambulatory Visit (INDEPENDENT_AMBULATORY_CARE_PROVIDER_SITE_OTHER): Payer: Medicare Other | Admitting: "Endocrinology

## 2019-01-09 VITALS — BP 96/60 | HR 97 | Ht 62.0 in | Wt 200.0 lb

## 2019-01-09 DIAGNOSIS — E1165 Type 2 diabetes mellitus with hyperglycemia: Secondary | ICD-10-CM

## 2019-01-09 MED ORDER — METFORMIN HCL ER 500 MG PO TB24: 500.0000 mg | ORAL_TABLET | Freq: Every day | ORAL | 3 refills | Status: DC

## 2019-01-09 NOTE — Progress Notes (Signed)
Endocrinology Consult Note       01/09/2019, 4:08 PM   Subjective:    Patient ID: Erica Price, female    DOB: 1961/06/29.  Erica Price is being seen in consultation for management of currently uncontrolled symptomatic diabetes requested by  [name], MD.   Past Medical History:  Diagnosis Date  . Apnea    Poss OSA  . Arthralgia   . Arthritis   . Depression   . Diabetes mellitus without complication ([place])   . Fatigue   . Fibromyalgia   . GERD (gastroesophageal reflux disease)   . IBS (irritable bowel syndrome)   . Myalgia   . Pinched nerve    lumbar-l5-[id number]  . PONV (postoperative nausea and vomiting)   . Snoring     Past Surgical History:  Procedure Laterality Date  . ARTHROSCOPIC REPAIR ACL Left   . BIOPSY N/A 06/12/2014   Procedure: BIOPSY;  Surgeon: Danie Binder, MD;  Location: [facility];  Service: Endoscopy;  Laterality: N/A;  . BIOPSY  09/14/2017   Procedure: BIOPSY;  Surgeon: Danie Binder, MD;  Location: [facility];  Service: Endoscopy;;  colon  . CARPAL TUNNEL RELEASE Right   . CHOLECYSTECTOMY  1990s  . CLUB FOOT RELEASE Left    x3  . COLONOSCOPY WITH PROPOFOL N/A 06/12/2014   Dr. Suzette Battiest colon polyps removed/moderate divertiiculosis/small internal hemorrhoids/moderate sized external hemorrhoids. tubular adenomas. Next surveillance in Oct 2018.   Marland Kitchen COLONOSCOPY WITH PROPOFOL N/A 09/14/2017   Dr. Oneida Alar: simple adenomas, moderate diverticulosis in rectosigmoid colon, sigmoid colon, and descending colon. External and internal hemorrhoids, normal colon biopsies. simple adenomas. Needs surveillance in 3 years with Propofol, colowrap  . ESOPHAGOGASTRODUODENOSCOPY (EGD) WITH PROPOFOL N/A 06/12/2014   Dr. 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 benign gastric and duodenal biopsies  . POLYPECTOMY N/A 06/12/2014   Procedure: POLYPECTOMY;   Surgeon: Danie Binder, MD;  Location: [facility];  Service: Endoscopy;  Laterality: N/A;  . POLYPECTOMY  09/14/2017   Procedure: POLYPECTOMY;  Surgeon: Danie Binder, MD;  Location: [facility];  Service: Endoscopy;;  colon  . SAVORY DILATION N/A 06/12/2014   Procedure: SAVORY DILATION;  Surgeon: Danie Binder, MD;  Location: [facility];  Service: Endoscopy;  Laterality: N/A;  12.8-17    Social History   Socioeconomic History  . Marital status: Married    Spouse name: Marcello Moores  . Number of children: 2  . Years of education: Not on file  . Highest education level: Associate degree: occupational, Hotel manager, or vocational program  Occupational History  . Occupation: disabled  Social Needs  . Financial resource strain: Not hard at Erica  . Food insecurity:    Worry: Never true    Inability: Never true  . Transportation needs:    Medical: No    Non-medical: No  Tobacco Use  . Smoking status: Former Smoker    Packs/day: 0.50    Years: 20.00    Pack years: 10.00    Types: Cigarettes    Last attempt to quit: 10/18/2011    Years since quitting: 7.2  .  Smokeless tobacco: Never Used  . Tobacco comment: quit in 2014  Substance and Sexual Activity  . Alcohol use: No    Frequency: Never  . Drug use: No  . Sexual activity: Not Currently    Birth control/protection: Post-menopausal  Lifestyle  . Physical activity:    Days per week: 1 day    Minutes per session: 20 min  . Stress: Not at Erica  Relationships  . Social connections:    Talks on phone: More than three times a week    Gets together: Once a week    Attends religious service: 1 to 4 times per year    Active member of club or organization: No    Attends meetings of clubs or organizations: Never    Relationship status: Married  Other Topics Concern  . Not on file  Social History Narrative   She is currently unemployed.  She was terminated on 06/15/2013.  She was previously a Loss adjuster, chartered, worked at [employer], and worked in  Charity fundraiser for 25 years.   She lives with husband.  They have two children.    Family History  Problem Relation Age of Onset  . Heart failure Father        Died, 78  . Hypertension Mother        Living, 65  . Breast cancer Mother   . Kidney cancer Mother   . Hypercholesterolemia Son   . Breast cancer Sister   . Pancreatic cancer Brother        passed age 61  . Hypertension Sister   . Colon cancer Neg Hx     Outpatient Encounter Medications as of 01/09/2019  Medication Sig  . albuterol (PROVENTIL HFA;VENTOLIN HFA) 108 (90 Base) MCG/ACT inhaler Inhale 2 puffs into the lungs every 6 (six) hours as needed for wheezing or shortness of breath.  Marland Kitchen aluminum chloride (DRYSOL) 20 % external solution Apply topically as needed.   . Artificial Tear Solution (SOOTHE XP) SOLN Apply 1 drop to eye daily as needed (dry eyes).  . Cholecalciferol (VITAMIN D3) 5000 units CAPS   . dicyclomine (BENTYL) 10 MG capsule TAKE 1 CAPSULE BY MOUTH FOUR TIMES DAILY BEFORE MEALS AND AT BEDTIME FOR LOOSE STOOL AND ABDOMINAL CRAMPING.  . DULoxetine (CYMBALTA) 60 MG capsule Take 60 mg daily by mouth.   . fluticasone (FLONASE) 50 MCG/ACT nasal spray Place 2 sprays into both nostrils daily as needed for allergies or rhinitis.  . furosemide (LASIX) 20 MG tablet Take 20 mg by mouth daily as needed for fluid or edema.   . gabapentin (NEURONTIN) 300 MG capsule Take 600 mg by mouth 3 (three) times daily.  Marland Kitchen Ketorolac Tromethamine (TORADOL IJ) Inject as directed as needed. Gets at dr office  . lidocaine (LINDAMANTLE) 3 % CREA cream Apply 1 application topically as needed (joint pain).   . metFORMIN (GLUCOPHAGE XR) 500 MG 24 hr tablet Take 1 tablet (500 mg total) by mouth daily after supper.  Marland Kitchen NYAMYC powder   . pantoprazole (PROTONIX) 40 MG tablet Take 1 tablet (40 mg total) by mouth daily before breakfast.  . traMADol (ULTRAM) 50 MG tablet Take 50-100 mg by mouth every 6 (six) hours as needed for moderate pain (depends on  pain level if takes  1-2 tablets).   . TRULICITY 1.61 WR/6.0AV SOPN Inject 0.75 mg into the skin once a week.  . zolpidem (AMBIEN) 10 MG tablet Take 5-10 mg by mouth at bedtime as needed for sleep (depends on insomnia  if takes 0.5-1 tablet).   . [DISCONTINUED] canagliflozin (INVOKANA) 300 MG TABS tablet Take 300 mg daily before breakfast by mouth.   No facility-administered encounter medications on file as of 01/09/2019.     ALLERGIES: No Known Allergies  VACCINATION STATUS:  There is no immunization history on file for this patient.  Diabetes  She presents for her initial diabetic visit. She has type 2 diabetes mellitus. Onset time: She was diagnosed at approximate age of 86 years. Her disease course has been worsening. There are no hypoglycemic associated symptoms. Pertinent negatives for hypoglycemia include no confusion, headaches, pallor or seizures. Associated symptoms include fatigue, polydipsia and polyuria. Pertinent negatives for diabetes include no chest pain and no polyphagia. There are no hypoglycemic complications. There are no diabetic complications. Risk factors for coronary artery disease include diabetes mellitus, obesity, sedentary lifestyle, post-menopausal and tobacco exposure. Current diabetic treatments: She is on Invokana 300 mg p.o. daily, Trulicity 0.17 mg subcutaneously weekly. Her weight is decreasing steadily. She is following a generally unhealthy diet. When asked about meal planning, she reported none. She has not had a previous visit with a dietitian. She rarely participates in exercise. (She did not bring any logs nor meter to review.  Her previsit labs show A1c of 8.7%. ) An ACE inhibitor/angiotensin II receptor blocker is not being taken. She does not see a podiatrist.Eye exam is current.     Review of Systems  Constitutional: Positive for fatigue. Negative for chills, fever and unexpected weight change.  HENT: Negative for trouble swallowing and voice change.    Eyes: Negative for visual disturbance.  Respiratory: Negative for cough, shortness of breath and wheezing.   Cardiovascular: Negative for chest pain, palpitations and leg swelling.  Gastrointestinal: Negative for diarrhea, nausea and vomiting.  Endocrine: Positive for polydipsia and polyuria. Negative for cold intolerance, heat intolerance and polyphagia.  Musculoskeletal: Negative for arthralgias and myalgias.  Skin: Negative for color change, pallor, rash and wound.  Neurological: Negative for seizures and headaches.  Psychiatric/Behavioral: Negative for confusion and suicidal ideas.    Objective:    BP 96/60   Pulse 97   Ht 5\' 2"  (1.575 m)   Wt 200 lb (90.7 kg)   BMI 36.58 kg/m   Wt Readings from Last 3 Encounters:  01/09/19 200 lb (90.7 kg)  10/21/18 204 lb 9.6 oz (92.8 kg)  06/13/18 209 lb (94.8 kg)     Physical Exam Constitutional:      Appearance: She is well-developed.  HENT:     Head: Normocephalic and atraumatic.  Neck:     Musculoskeletal: Normal range of motion and neck supple.     Thyroid: No thyromegaly.     Trachea: No tracheal deviation.  Pulmonary:     Effort: Pulmonary effort is normal.  Abdominal:     Tenderness: There is no abdominal tenderness. There is no guarding.  Musculoskeletal: Normal range of motion.  Skin:    General: Skin is warm and dry.     Coloration: Skin is not pale.     Findings: No erythema or rash.  Neurological:     Mental Status: She is alert and oriented to person, place, and time.     Cranial Nerves: No cranial nerve deficit.     Coordination: Coordination normal.     Deep Tendon Reflexes: Reflexes are normal and symmetric.  Psychiatric:        Mood and Affect: Mood normal.        Judgment: Judgment normal.  CMP ( most recent) CMP     Component Value Date/Time   NA 135 07/27/2017 0916   K 4.5 07/27/2017 0916   CL 103 07/27/2017 0916   CO2 23 07/27/2017 0916   GLUCOSE 185 (H) 07/27/2017 0916   BUN 17  07/27/2017 0916   CREATININE 1.01 (H) 07/27/2017 0916   CALCIUM 9.1 07/27/2017 0916   GFRNONAA >60 07/27/2017 0916   GFRAA >60 07/27/2017 0916     Recent Results (from the past 2160 hour(s))  Microalbumin, urine     Status: None   Collection Time: 11/14/18 12:00 AM  Result Value Ref Range   Microalb, Ur 10   Basic metabolic panel     Status: Abnormal   Collection Time: 11/14/18 12:00 AM  Result Value Ref Range   BUN 22 (A) 4 - 21   Creatinine 0.8 0.5 - 1.1  Lipid panel     Status: Abnormal   Collection Time: 11/14/18 12:00 AM  Result Value Ref Range   Triglycerides 178 (A) 40 - 160   Cholesterol 227 (A) 0 - 200   HDL 54 35 - 70   LDL Cholesterol 137       Assessment & Plan:   1. Uncontrolled type 2 diabetes mellitus with hyperglycemia ([place])  - Erica Price has currently uncontrolled symptomatic type 2 DM since  58 years of age,  with most recent A1c of 8.7 %. Recent labs reviewed. - I had a long discussion with her about the progressive nature of diabetes and the pathology behind its complications. -her diabetes is complicated by obesity/sedentary life and she remains at a high risk for more acute and chronic complications which include CAD, CVA, CKD, retinopathy, and neuropathy. These are Erica discussed in detail with her.  - I have counseled her on diet management and weight loss, by adopting a carbohydrate restricted/protein rich diet. - she admits that there is a room for improvement in her food and drink choices. - Suggestion is made for her to avoid simple carbohydrates  from her diet including Cakes, Sweet Desserts, Ice Cream, Soda (diet and regular), Sweet Tea, Candies, Chips, Cookies, Store Bought Juices, Alcohol in Excess of  1-2 drinks a day, Artificial Sweeteners,  Coffee Creamer, and "Sugar-free" Products. This will help patient to have more stable blood glucose profile and potentially avoid unintended weight gain.  - I encouraged her to switch to  unprocessed  or minimally processed complex starch and increased protein intake (animal or plant source), fruits, and vegetables.  - she is advised to stick to a routine mealtimes to eat 3 meals  a day and avoid unnecessary snacks ( to snack only to correct hypoglycemia).   - she will be scheduled with Jearld Fenton, RDN, CDE for individualized diabetes education.  - I have approached her with the following individualized plan to manage diabetes and patient agrees:   - she is approached to start monitoring blood glucose 4 times a day-before meals and at bedtime and return in 10 days with her meter and logs.    - she is encouraged to call clinic for blood glucose levels less than 70 or above 300 mg /dl. - she is advised to continue Trulicity 1.60 mg subcutaneously weekly, therapeutically suitable for patient . - her Anastasio Auerbach will be discontinued, risk outweighs benefit for this patient. -She reports intolerance to metformin in the past, however wishing to retry metformin extended release.  I discussed and added metformin ER 500 mg daily after supper  to advance if tolerated. - Patient specific target  A1c;  LDL, HDL, Triglycerides, and  Waist Circumference were discussed in detail.  2) Blood Pressure /Hypertension:  her blood pressure is  controlled to target.   she is not on any antihypertensive medications.     3) Lipids/Hyperlipidemia:   Review of her recent lipid panel showed uncontrolled  LDL at 137 .  she  is not on any statins, unclear reasons.  She will be considered for low-dose statin medication on subsequent visits if no contraindications.   4)  Weight/Diet:  Body mass index is 36.58 kg/m.  -   clearly complicating her diabetes care.  I discussed with her the fact that loss of 5 - 10% of her  current body weight will have the most impact on her diabetes management.  CDE Consult will be initiated . Exercise, and detailed carbohydrates information provided  -  detailed on discharge  instructions.  5) Chronic Care/Health Maintenance:  -she   is encouraged to initiate and continue to follow up with Ophthalmology, Dentist,  Podiatrist at least yearly or according to recommendations, and advised to   stay away from smoking. I have recommended yearly flu vaccine and pneumonia vaccine at least every 5 years; moderate intensity exercise for up to 150 minutes weekly; and  sleep for at least 7 hours a day.  - she is  advised to maintain close follow up with [name], MD for primary care needs, as well as her other providers for optimal and coordinated care.  - Time spent with the patient: 45 minutes, of which >50% was spent in obtaining information about her symptoms, reviewing her previous labs/studies, evaluations, and treatments, counseling her about her currently uncontrolled, symptomatic type 2 diabetes, hyperlipidemia, obesity, and developing plans for long term treatment based on the latest standards of care/guidelines.  Please refer to " Patient Self Inventory" in the Media  tab for reviewed elements of pertinent patient history.  Erica Price participated in the discussions, expressed understanding, and voiced agreement with the above plans.  Erica Price were answered to her satisfaction. she is encouraged to contact clinic should she have any Price or concerns prior to her return visit.  Follow up plan: - Return in about 10 days (around 01/19/2019) for Follow up with Meter and Logs Only - no Labs.  Glade Lloyd, MD [facility] [address] [place], [place] [address] Phone: [phone]  Fax: [phone]    01/09/2019, 4:08 PM  This note was partially dictated with voice recognition software. Similar sounding words can be transcribed inadequately or may not  be corrected upon review.

## 2019-01-09 NOTE — Patient Instructions (Signed)

## 2019-01-11 ENCOUNTER — Ambulatory Visit: Payer: Medicare Other | Admitting: Endocrinology

## 2019-01-12 ENCOUNTER — Encounter: Payer: Medicare Other | Attending: "Endocrinology | Admitting: Nutrition

## 2019-01-12 ENCOUNTER — Other Ambulatory Visit: Payer: Self-pay

## 2019-01-12 VITALS — Ht 62.0 in | Wt 200.0 lb

## 2019-01-12 DIAGNOSIS — E1165 Type 2 diabetes mellitus with hyperglycemia: Secondary | ICD-10-CM | POA: Insufficient documentation

## 2019-01-12 DIAGNOSIS — E669 Obesity, unspecified: Secondary | ICD-10-CM | POA: Insufficient documentation

## 2019-01-12 DIAGNOSIS — IMO0002 Reserved for concepts with insufficient information to code with codable children: Secondary | ICD-10-CM

## 2019-01-12 DIAGNOSIS — E118 Type 2 diabetes mellitus with unspecified complications: Secondary | ICD-10-CM | POA: Insufficient documentation

## 2019-01-12 NOTE — Progress Notes (Signed)
Phone viist. . Medical Nutrition Therapy:  Appt start time: 1100 end time:  1200.   Assessment:  Primary concerns today: Diabetes Type 2 and Hyperlipidemia and Fibromyalgia.. Lives with her husband. She and husband cook and shop. Foods are baked and grilled. DX with DM 2016. Eats three meals per day. Marland Kitchen Has fibromyalgia.Is on disability. FBS 168, 245, 173,  Bedtime: 224, 173, 176  Metformin 500 mg ER daily. Was on Invokana and has stopped it since Monday Sees Dr. Dorris Fetch, Endocrinology.. Lost 13 lbs in the last 2-3 months unintentially. Last A1c 8.7% she reports. She is willing and wanting to eat betterr to lose weight, and improve her DM.  CMP Latest Ref Rng & Units 11/14/2018 07/27/2017 06/07/2014  Glucose 65 - 99 mg/dL - 185(H) 145(H)  BUN 4 - 21 22(A) 17 18  Creatinine 0.5 - 1.1 0.8 1.01(H) 0.97  Sodium 135 - 145 mmol/L - 135 140  Potassium 3.5 - 5.1 mmol/L - 4.5 5.1  Chloride 101 - 111 mmol/L - 103 100  CO2 22 - 32 mmol/L - 23 27  Calcium 8.9 - 10.3 mg/dL - 9.1 9.4   Lipid Panel     Component Value Date/Time   CHOL 227 (A) 11/14/2018   TRIG 178 (A) 11/14/2018   HDL 54 11/14/2018   LDLCALC 137 11/14/2018     Preferred Learning Style:    No preference indicated   Learning Readiness:  Ready  Change in progress   MEDICATIONS:   DIETARY INTAKE:     24-hr recall:  B ( AM):Instant grils or egg, coffee, creamer  Snk ( AM):   L ( PM): Ham sandwich on white bread, mayo, 1 slice cheese,  water Snk ( PM):  D ( PM): Bacon and eggs, water Snk ( PM): 6 oz raspberries Beverages: water  Usual physical activity: ADL   Estimated energy needs: 1200  calories 135 g carbohydrates 90 g protein 33 g fat  Progress Towards Goal(s):  In progress.   Nutritional Diagnosis:  NB-1.1 Food and nutrition-related knowledge deficit As related to Diabetes Type 2,  Hyperlipidmia and Obeisty.  As evidenced by A1C 8.7% and BMI 37.    Intervention:  Nutrition Nutrition and Diabetes  education provided on My Plate, CHO counting, meal planning, portion sizes, timing of meals, avoiding snacks between meals unless having a low blood sugar, target ranges for A1C and blood sugars, signs/symptoms and treatment of hyper/hypoglycemia, monitoring blood sugars, taking medications as prescribed, benefits of exercising 30 minutes per day and prevention of complications of DM. Marland Kitchen Goals Follow MY Plate Eat 38-75 grams of carbs per meal Eat meals on time. Don't skip meals. Increase fresh fruits and vegetables Goal A1C 7% or less. Lose 2-3 lbs per month.  Teaching Method Utilized:  Telephone visit. Auditory   Handouts given during visit include:  Verbally went over My Plate and CHO counting   Barriers to learning/adherence to lifestyle change: none  Demonstrated degree of understanding via:  Teach Back   Monitoring/Evaluation:  Dietary intake, exercise, , and body weight in 1 month(s).

## 2019-01-23 ENCOUNTER — Other Ambulatory Visit: Payer: Self-pay

## 2019-01-23 ENCOUNTER — Encounter: Payer: Self-pay | Admitting: "Endocrinology

## 2019-01-23 ENCOUNTER — Ambulatory Visit (INDEPENDENT_AMBULATORY_CARE_PROVIDER_SITE_OTHER): Payer: Medicare Other | Admitting: "Endocrinology

## 2019-01-23 VITALS — BP 104/71 | HR 76 | Ht 62.0 in | Wt 203.0 lb

## 2019-01-23 DIAGNOSIS — E1165 Type 2 diabetes mellitus with hyperglycemia: Secondary | ICD-10-CM | POA: Diagnosis not present

## 2019-01-23 MED ORDER — INSULIN DEGLUDEC 100 UNIT/ML ~~LOC~~ SOPN: 20.0000 [IU] | PEN_INJECTOR | Freq: Every day | SUBCUTANEOUS | 2 refills | Status: DC

## 2019-01-23 MED ORDER — INSULIN PEN NEEDLE 31G X 8 MM MISC: 1.0000 | 3 refills | Status: DC

## 2019-01-23 NOTE — Patient Instructions (Signed)

## 2019-01-23 NOTE — Progress Notes (Signed)
Endocrinology follow-up note       01/23/2019, 3:11 PM   Subjective:    Patient ID: Erica Price, female    DOB: 05-01-1961.  Erica Price is being seen in follow-up for management of currently uncontrolled symptomatic diabetes requested by  [name], MD.   Past Medical History:  Diagnosis Date  . Apnea    Poss OSA  . Arthralgia   . Arthritis   . Depression   . Diabetes mellitus without complication ([place])   . Fatigue   . Fibromyalgia   . GERD (gastroesophageal reflux disease)   . IBS (irritable bowel syndrome)   . Myalgia   . Pinched nerve    [id number]-[id number]  . PONV (postoperative nausea and vomiting)   . Snoring     Past Surgical History:  Procedure Laterality Date  . ARTHROSCOPIC REPAIR ACL Left   . BIOPSY N/A 06/12/2014   Procedure: BIOPSY;  Surgeon: Danie Binder, MD;  Location: [facility];  Service: Endoscopy;  Laterality: N/A;  . BIOPSY  09/14/2017   Procedure: BIOPSY;  Surgeon: Danie Binder, MD;  Location: [facility];  Service: Endoscopy;;  colon  . CARPAL TUNNEL RELEASE Right   . CHOLECYSTECTOMY  1990s  . CLUB FOOT RELEASE Left    x3  . COLONOSCOPY WITH PROPOFOL N/A 06/12/2014   Dr. Suzette Battiest colon polyps removed/moderate divertiiculosis/small internal hemorrhoids/moderate sized external hemorrhoids. tubular adenomas. Next surveillance in Oct 2018.   Marland Kitchen COLONOSCOPY WITH PROPOFOL N/A 09/14/2017   Dr. Oneida Alar: simple adenomas, moderate diverticulosis in rectosigmoid colon, sigmoid colon, and descending colon. External and internal hemorrhoids, normal colon biopsies. simple adenomas. Needs surveillance in 3 years with Propofol, colowrap  . ESOPHAGOGASTRODUODENOSCOPY (EGD) WITH PROPOFOL N/A 06/12/2014   Dr. 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 benign gastric and duodenal biopsies  . POLYPECTOMY N/A 06/12/2014   Procedure: POLYPECTOMY;   Surgeon: Danie Binder, MD;  Location: [facility];  Service: Endoscopy;  Laterality: N/A;  . POLYPECTOMY  09/14/2017   Procedure: POLYPECTOMY;  Surgeon: Danie Binder, MD;  Location: [facility];  Service: Endoscopy;;  colon  . SAVORY DILATION N/A 06/12/2014   Procedure: SAVORY DILATION;  Surgeon: Danie Binder, MD;  Location: [facility];  Service: Endoscopy;  Laterality: N/A;  12.8-17    Social History   Socioeconomic History  . Marital status: Married    Spouse name: Marcello Moores  . Number of children: 2  . Years of education: Not on file  . Highest education level: Associate degree: occupational, Hotel manager, or vocational program  Occupational History  . Occupation: disabled  Social Needs  . Financial resource strain: Not hard at all  . Food insecurity:    Worry: Never true    Inability: Never true  . Transportation needs:    Medical: No    Non-medical: No  Tobacco Use  . Smoking status: Former Smoker    Packs/day: 0.50    Years: 20.00    Pack years: 10.00    Types: Cigarettes    Last attempt to quit: 10/18/2011    Years since quitting: 7.2  .  Smokeless tobacco: Never Used  . Tobacco comment: quit in 2014  Substance and Sexual Activity  . Alcohol use: No    Frequency: Never  . Drug use: No  . Sexual activity: Not Currently    Birth control/protection: Post-menopausal  Lifestyle  . Physical activity:    Days per week: 1 day    Minutes per session: 20 min  . Stress: Not at all  Relationships  . Social connections:    Talks on phone: More than three times a week    Gets together: Once a week    Attends religious service: 1 to 4 times per year    Active member of club or organization: No    Attends meetings of clubs or organizations: Never    Relationship status: Married  Other Topics Concern  . Not on file  Social History Narrative   She is currently unemployed.  She was terminated on 06/15/2013.  She was previously a Loss adjuster, chartered, worked at [employer], and worked in  Charity fundraiser for 25 years.   She lives with husband.  They have two children.    Family History  Problem Relation Age of Onset  . Heart failure Father        Died, 78  . Hypertension Mother        Living, 43  . Breast cancer Mother   . Kidney cancer Mother   . Hypercholesterolemia Son   . Breast cancer Sister   . Pancreatic cancer Brother        passed age 43  . Hypertension Sister   . Colon cancer Neg Hx     Outpatient Encounter Medications as of 01/23/2019  Medication Sig  . albuterol (PROVENTIL HFA;VENTOLIN HFA) 108 (90 Base) MCG/ACT inhaler Inhale 2 puffs into the lungs every 6 (six) hours as needed for wheezing or shortness of breath.  Marland Kitchen aluminum chloride (DRYSOL) 20 % external solution Apply topically as needed.   . Artificial Tear Solution (SOOTHE XP) SOLN Apply 1 drop to eye daily as needed (dry eyes).  . Cholecalciferol (VITAMIN D3) 5000 units CAPS   . dicyclomine (BENTYL) 10 MG capsule TAKE 1 CAPSULE BY MOUTH FOUR TIMES DAILY BEFORE MEALS AND AT BEDTIME FOR LOOSE STOOL AND ABDOMINAL CRAMPING.  . DULoxetine (CYMBALTA) 60 MG capsule Take 60 mg daily by mouth.   . fluticasone (FLONASE) 50 MCG/ACT nasal spray Place 2 sprays into both nostrils daily as needed for allergies or rhinitis.  . furosemide (LASIX) 20 MG tablet Take 20 mg by mouth daily as needed for fluid or edema.   . gabapentin (NEURONTIN) 300 MG capsule Take 600 mg by mouth 3 (three) times daily.  . insulin degludec (TRESIBA FLEXTOUCH) 100 UNIT/ML SOPN FlexTouch Pen Inject 0.2 mLs (20 Units total) into the skin at bedtime.  . Insulin Pen Needle (B-D ULTRAFINE III SHORT PEN) 31G X 8 MM MISC 1 each by Does not apply route as directed.  Marland Kitchen Ketorolac Tromethamine (TORADOL IJ) Inject as directed as needed. Gets at dr office  . lidocaine (LINDAMANTLE) 3 % CREA cream Apply 1 application topically as needed (joint pain).   . metFORMIN (GLUCOPHAGE XR) 500 MG 24 hr tablet Take 1 tablet (500 mg total) by mouth daily after supper.   Marland Kitchen NYAMYC powder   . pantoprazole (PROTONIX) 40 MG tablet Take 1 tablet (40 mg total) by mouth daily before breakfast.  . traMADol (ULTRAM) 50 MG tablet Take 50-100 mg by mouth every 6 (six) hours as needed for moderate pain (  depends on pain level if takes  1-2 tablets).   . TRULICITY 3.79 KW/4.0XB SOPN Inject 0.75 mg into the skin once a week.  . zolpidem (AMBIEN) 10 MG tablet Take 5-10 mg by mouth at bedtime as needed for sleep (depends on insomnia if takes 0.5-1 tablet).    No facility-administered encounter medications on file as of 01/23/2019.     ALLERGIES: No Known Allergies  VACCINATION STATUS:  There is no immunization history on file for this patient.  Diabetes  She presents for her follow-up diabetic visit. She has type 2 diabetes mellitus. Onset time: She was diagnosed at approximate age of 66 years. Her disease course has been worsening. There are no hypoglycemic associated symptoms. Pertinent negatives for hypoglycemia include no confusion, headaches, pallor or seizures. Associated symptoms include fatigue, polydipsia and polyuria. Pertinent negatives for diabetes include no chest pain and no polyphagia. There are no hypoglycemic complications. Symptoms are worsening. There are no diabetic complications. Risk factors for coronary artery disease include diabetes mellitus, obesity, sedentary lifestyle, post-menopausal and tobacco exposure. Current diabetic treatments: She is on Invokana 300 mg p.o. daily, Trulicity 3.53 mg subcutaneously weekly. Her weight is decreasing steadily. She is following a generally unhealthy diet. When asked about meal planning, she reported none. She has not had a previous visit with a dietitian. She rarely participates in exercise. Her breakfast blood glucose range is generally >200 mg/dl. Her lunch blood glucose range is generally >200 mg/dl. Her dinner blood glucose range is generally >200 mg/dl. Her bedtime blood glucose range is generally >200 mg/dl. Her  overall blood glucose range is >200 mg/dl. An ACE inhibitor/angiotensin II receptor blocker is not being taken. She does not see a podiatrist.Eye exam is current.     Review of Systems  Constitutional: Positive for fatigue. Negative for chills, fever and unexpected weight change.  HENT: Negative for trouble swallowing and voice change.   Eyes: Negative for visual disturbance.  Respiratory: Negative for cough, shortness of breath and wheezing.   Cardiovascular: Negative for chest pain, palpitations and leg swelling.  Gastrointestinal: Negative for diarrhea, nausea and vomiting.  Endocrine: Positive for polydipsia and polyuria. Negative for cold intolerance, heat intolerance and polyphagia.  Musculoskeletal: Negative for arthralgias and myalgias.  Skin: Negative for color change, pallor, rash and wound.  Neurological: Negative for seizures and headaches.  Psychiatric/Behavioral: Negative for confusion and suicidal ideas.    Objective:    BP 104/71   Pulse 76   Ht 5\' 2"  (1.575 m)   Wt 203 lb (92.1 kg)   BMI 37.13 kg/m   Wt Readings from Last 3 Encounters:  01/23/19 203 lb (92.1 kg)  01/12/19 200 lb (90.7 kg)  01/09/19 200 lb (90.7 kg)     Physical Exam Constitutional:      Appearance: She is well-developed.  HENT:     Head: Normocephalic and atraumatic.  Neck:     Musculoskeletal: Normal range of motion and neck supple.     Thyroid: No thyromegaly.     Trachea: No tracheal deviation.  Pulmonary:     Effort: Pulmonary effort is normal.  Abdominal:     Tenderness: There is no abdominal tenderness. There is no guarding.  Musculoskeletal: Normal range of motion.  Skin:    General: Skin is warm and dry.     Coloration: Skin is not pale.     Findings: No erythema or rash.  Neurological:     Mental Status: She is alert and oriented to person, place, and  time.     Cranial Nerves: No cranial nerve deficit.     Coordination: Coordination normal.     Deep Tendon Reflexes:  Reflexes are normal and symmetric.  Psychiatric:        Mood and Affect: Mood normal.        Judgment: Judgment normal.       CMP ( most recent) CMP     Component Value Date/Time   NA 135 07/27/2017 0916   K 4.5 07/27/2017 0916   CL 103 07/27/2017 0916   CO2 23 07/27/2017 0916   GLUCOSE 185 (H) 07/27/2017 0916   BUN 22 (A) 11/14/2018   CREATININE 0.8 11/14/2018   CREATININE 1.01 (H) 07/27/2017 0916   CALCIUM 9.1 07/27/2017 0916   GFRNONAA >60 07/27/2017 0916   GFRAA >60 07/27/2017 0916     Recent Results (from the past 2160 hour(s))  Microalbumin, urine     Status: None   Collection Time: 11/14/18 12:00 AM  Result Value Ref Range   Microalb, Ur 10   Basic metabolic panel     Status: Abnormal   Collection Time: 11/14/18 12:00 AM  Result Value Ref Range   BUN 22 (A) 4 - 21   Creatinine 0.8 0.5 - 1.1  Lipid panel     Status: Abnormal   Collection Time: 11/14/18 12:00 AM  Result Value Ref Range   Triglycerides 178 (A) 40 - 160   Cholesterol 227 (A) 0 - 200   HDL 54 35 - 70   LDL Cholesterol 137       Assessment & Plan:   1. Uncontrolled type 2 diabetes mellitus with hyperglycemia ([place])  - Erica Price has currently uncontrolled symptomatic type 2 DM since  58 years of age. -She returns significantly above target glycemic profile,  her most recent A1c of 8.7%.   -her diabetes is complicated by obesity/sedentary life and she remains at a high risk for more acute and chronic complications which include CAD, CVA, CKD, retinopathy, and neuropathy. These are all discussed in detail with her.  - I have counseled her on diet management and weight loss, by adopting a carbohydrate restricted/protein rich diet.  - Patient admits there is a room for improvement in her diet and drink choices. -  Suggestion is made for her to avoid simple carbohydrates  from her diet including Cakes, Sweet Desserts / Pastries, Ice Cream, Soda (diet and regular), Sweet Tea, Candies, Chips,  Cookies, Store Bought Juices, Alcohol in Excess of  1-2 drinks a day, Artificial Sweeteners, and "Sugar-free" Products. This will help patient to have stable blood glucose profile and potentially avoid unintended weight gain.  - I encouraged her to switch to  unprocessed or minimally processed complex starch and increased protein intake (animal or plant source), fruits, and vegetables.  - she is advised to stick to a routine mealtimes to eat 3 meals  a day and avoid unnecessary snacks ( to snack only to correct hypoglycemia).   - she will be scheduled with Jearld Fenton, RDN, CDE for individualized diabetes education.  - I have approached her with the following individualized plan to manage diabetes and patient agrees:   -Based on her presenting glycemic profile, she will need insulin treatment in order for her to achieve control of diabetes to target. -To start, I discussed and initiated Tresiba 20 units nightly, associated with monitoring of blood glucose 2 times a day daily before breakfast and at bedtime.   - she is encouraged to  call clinic for blood glucose levels less than 70 or above 300 mg /dl. - she is advised to continue Trulicity 4.49 mg subcutaneously weekly, therapeutically suitable for patient .  -She reports intolerance to metformin in the past, however -she is tolerating the lower dose metformin.  She is advised to continue metformin 500 mg once a day after supper.  - Patient specific target  A1c;  LDL, HDL, Triglycerides, and  Waist Circumference were discussed in detail.  2) Blood Pressure /Hypertension:  her blood pressure is  controlled to target.   she is not on any antihypertensive medications.     3) Lipids/Hyperlipidemia:   Review of her recent lipid panel showed uncontrolled  LDL at 137 .  she  is not on any statins, unclear reasons.  She will be considered for low-dose statin medication on subsequent visits if no contraindications.   4)  Weight/Diet:  Body mass  index is 37.13 kg/m.  -   clearly complicating her diabetes care.  I discussed with her the fact that loss of 5 - 10% of her  current body weight will have the most impact on her diabetes management.  CDE Consult will be initiated . Exercise, and detailed carbohydrates information provided  -  detailed on discharge instructions.  5) Chronic Care/Health Maintenance:  -she   is encouraged to initiate and continue to follow up with Ophthalmology, Dentist,  Podiatrist at least yearly or according to recommendations, and advised to   stay away from smoking. I have recommended yearly flu vaccine and pneumonia vaccine at least every 5 years; moderate intensity exercise for up to 150 minutes weekly; and  sleep for at least 7 hours a day.  - she is  advised to maintain close follow up with [name], MD for primary care needs, as well as her other providers for optimal and coordinated care.  - Time spent with the patient: 25 min, of which >50% was spent in reviewing her blood glucose logs , discussing her hypoglycemia and hyperglycemia episodes, reviewing her current and  previous labs / studies and medications  doses and developing a plan to avoid hypoglycemia and hyperglycemia. Please refer to Patient Instructions for Blood Glucose Monitoring and Insulin/Medications Dosing Guide"  in media tab for additional information. Please  also refer to " Patient Self Inventory" in the Media  tab for reviewed elements of pertinent patient history.  Allena Katz participated in the discussions, expressed understanding, and voiced agreement with the above plans.  All questions were answered to her satisfaction. she is encouraged to contact clinic should she have any questions or concerns prior to her return visit.   Follow up plan: - Return in about 5 weeks (around 02/27/2019) for Follow up with Pre-visit Labs, Meter, and Logs.  Glade Lloyd, MD [facility] [address] [place], [place] [address] Phone: [phone]  Fax: [phone]    01/23/2019, 3:11 PM  This note was partially dictated with voice recognition software. Similar sounding words can be transcribed inadequately or may not  be corrected upon review.

## 2019-01-25 ENCOUNTER — Other Ambulatory Visit: Payer: Self-pay

## 2019-01-25 ENCOUNTER — Encounter: Payer: Self-pay | Admitting: Nutrition

## 2019-01-25 ENCOUNTER — Encounter: Payer: Medicare Other | Attending: Family Medicine | Admitting: Nutrition

## 2019-01-25 VITALS — Ht 62.0 in | Wt 203.0 lb

## 2019-01-25 DIAGNOSIS — E118 Type 2 diabetes mellitus with unspecified complications: Secondary | ICD-10-CM | POA: Insufficient documentation

## 2019-01-25 DIAGNOSIS — IMO0002 Reserved for concepts with insufficient information to code with codable children: Secondary | ICD-10-CM

## 2019-01-25 DIAGNOSIS — E1165 Type 2 diabetes mellitus with hyperglycemia: Secondary | ICD-10-CM | POA: Insufficient documentation

## 2019-01-25 DIAGNOSIS — E669 Obesity, unspecified: Secondary | ICD-10-CM | POA: Insufficient documentation

## 2019-01-25 DIAGNOSIS — E782 Mixed hyperlipidemia: Secondary | ICD-10-CM | POA: Insufficient documentation

## 2019-01-25 NOTE — Progress Notes (Signed)
.   Medical Nutrition Therapy:  Appt start time: 1100 end time: 1130.   Assessment:  Primary concerns today: Diabetes Type 2 and Hyperlipidemia and Fibromyalgia.. Lives with her husband. She and husband cook and shop. Foods are baked and grilled. DX with DM 2016. Eats three meals per day. Has been measuring foods and weighing foods and eating better balanced meals. Has fibromyalgia.Is on disability. FBS  208, 178, 220  Bedtime:218, 207, Now taking 20 units of Tresiba at night. Metformin 500 mg ER daily. Sees Dr. Dorris Fetch, Endocrinology. Tolerating Metformin ER well.. Trulicility once a week. Needs finanical help with it since in donut hole. Can't afford it otherwise. Commitment is good and making changes with eating habits a lot.  Last A1c 8.7% she reports. She is willing and wanting to eat betterr to lose weight, and improve her DM.  CMP Latest Ref Rng & Units 11/14/2018 07/27/2017 06/07/2014  Glucose 65 - 99 mg/dL - 185(H) 145(H)  BUN 4 - 21 22(A) 17 18  Creatinine 0.5 - 1.1 0.8 1.01(H) 0.97  Sodium 135 - 145 mmol/L - 135 140  Potassium 3.5 - 5.1 mmol/L - 4.5 5.1  Chloride 101 - 111 mmol/L - 103 100  CO2 22 - 32 mmol/L - 23 27  Calcium 8.9 - 10.3 mg/dL - 9.1 9.4   Lipid Panel     Component Value Date/Time   CHOL 227 (A) 11/14/2018   TRIG 178 (A) 11/14/2018   HDL 54 11/14/2018   LDLCALC 137 11/14/2018     Preferred Learning Style:    No preference indicated   Learning Readiness:  Ready  Change in progress   MEDICATIONS:   DIETARY INTAKE:     24-hr recall:  B ( AM): 1/2 c special, k raspberry with little milk, fruit an coffee or reduce oatmeal Snk ( AM):   L ( PM):oss salad, - Snk ( PM):  D ( PM): Tacos 1 with flour tortilla,,water  Beverages: water  Usual physical activity: ADL   Estimated energy needs: 1200  calories 135 g carbohydrates 90 g protein 33 g fat  Progress Towards Goal(s):  In progress.   Nutritional Diagnosis:  NB-1.1 Food and  nutrition-related knowledge deficit As related to Diabetes Type 2,  Hyperlipidmia and Obeisty.  As evidenced by A1C 8.7%.    Intervention: Nutrition and Diabetes education provided on My Plate, CHO counting, meal planning, portion sizes, timing of meals, avoiding snacks between meals unless having a low blood sugar, target ranges for A1C and blood sugars, signs/symptoms and treatment of hyper/hypoglycemia, monitoring blood sugars, taking medications as prescribed, benefits of exercising 30 minutes per day and prevention of complications of DM.   Goals Keep up the great job!!  Eat 30 -45 grams of carbs per meal Increase exercise to 60 minutes 3 -4 times per week Keep measuring and sticking to meal plan Lose 1 lb per week Get A1C to 7% or less Call [facility] for trulicity help due to donut hole Add more protein to meals with veggies.  Teaching Method Utilized: Visual Auditory Hands on  Handouts given during visit include:  Verbally went over carb counting  Barriers to learning/adherence to lifestyle change:  none  Demonstrated degree of understanding via:  Teach Back   Monitoring/Evaluation:  Dietary intake, exercise, , and body weight in 3 month(s).

## 2019-01-25 NOTE — Patient Instructions (Addendum)
Goals Keep up the great job!!  Eat 30 -45 grams of carbs per meal Increase exercise to 60 minutes 3 -4 times per week Keep measuring and sticking to meal plan Lose 1 lb per week Get A1C to 7% or less Call [facility] for trulicity help due to donut hole Add more protein to meals with veggies.

## 2019-01-31 ENCOUNTER — Telehealth: Payer: Self-pay | Admitting: "Endocrinology

## 2019-01-31 NOTE — Telephone Encounter (Signed)
Patient needs some information to fill out patient assistance form for Trulicity

## 2019-02-01 ENCOUNTER — Encounter: Payer: Self-pay | Admitting: Nutrition

## 2019-02-01 NOTE — Patient Instructions (Signed)
Goals Follow MY Plate Eat 80-32 grams of carbs per meal Eat meals on time. Don't skip meals. Increase fresh fruits and vegetables Goal A1C 7% or less. Lose 2-3 lbs per month.

## 2019-02-01 NOTE — Telephone Encounter (Signed)
Pt needed fax # for forms

## 2019-02-28 ENCOUNTER — Other Ambulatory Visit (HOSPITAL_COMMUNITY): Payer: Medicare Other

## 2019-02-28 ENCOUNTER — Other Ambulatory Visit: Payer: Self-pay | Admitting: "Endocrinology

## 2019-02-28 ENCOUNTER — Other Ambulatory Visit: Payer: Self-pay

## 2019-02-28 ENCOUNTER — Other Ambulatory Visit: Payer: Medicare Other

## 2019-02-28 DIAGNOSIS — Z20822 Contact with and (suspected) exposure to covid-19: Secondary | ICD-10-CM

## 2019-02-28 DIAGNOSIS — E7849 Other hyperlipidemia: Secondary | ICD-10-CM | POA: Diagnosis not present

## 2019-02-28 DIAGNOSIS — R6889 Other general symptoms and signs: Secondary | ICD-10-CM | POA: Diagnosis not present

## 2019-02-28 DIAGNOSIS — E1165 Type 2 diabetes mellitus with hyperglycemia: Secondary | ICD-10-CM | POA: Diagnosis not present

## 2019-03-01 LAB — MICROALBUMIN, URINE: Microalbumin, Urine: 11.3 ug/mL

## 2019-03-01 LAB — TSH: TSH: 1.69 u[IU]/mL (ref 0.450–4.500)

## 2019-03-01 LAB — VITAMIN D 25 HYDROXY (VIT D DEFICIENCY, FRACTURES): Vit D, 25-Hydroxy: 67.5 ng/mL (ref 30.0–100.0)

## 2019-03-01 LAB — LIPID PANEL W/O CHOL/HDL RATIO
Cholesterol, Total: 177 mg/dL (ref 100–199)
HDL: 41 mg/dL (ref >39)
LDL Calculated: 97 mg/dL (ref 0–99)
Triglycerides: 194 mg/dL — ABNORMAL HIGH (ref 0–149)
VLDL Cholesterol Cal: 39 mg/dL (ref 5–40)

## 2019-03-01 LAB — T4, FREE: Free T4: 1.18 ng/dL (ref 0.82–1.77)

## 2019-03-01 LAB — HGB A1C W/O EAG: Hgb A1c MFr Bld: 8.8 % — ABNORMAL HIGH (ref 4.8–5.6)

## 2019-03-04 LAB — NOVEL CORONAVIRUS, NAA: SARS-CoV-2, NAA: NOT DETECTED

## 2019-03-08 DIAGNOSIS — J0100 Acute maxillary sinusitis, unspecified: Secondary | ICD-10-CM | POA: Diagnosis not present

## 2019-03-08 DIAGNOSIS — Z1389 Encounter for screening for other disorder: Secondary | ICD-10-CM | POA: Diagnosis not present

## 2019-03-08 DIAGNOSIS — J302 Other seasonal allergic rhinitis: Secondary | ICD-10-CM | POA: Diagnosis not present

## 2019-03-08 DIAGNOSIS — M797 Fibromyalgia: Secondary | ICD-10-CM | POA: Diagnosis not present

## 2019-03-09 ENCOUNTER — Encounter: Payer: Self-pay | Admitting: Nutrition

## 2019-03-09 ENCOUNTER — Other Ambulatory Visit: Payer: Self-pay

## 2019-03-09 ENCOUNTER — Encounter: Payer: Medicare Other | Attending: Family Medicine | Admitting: Nutrition

## 2019-03-09 ENCOUNTER — Encounter: Payer: Self-pay | Admitting: "Endocrinology

## 2019-03-09 ENCOUNTER — Ambulatory Visit (INDEPENDENT_AMBULATORY_CARE_PROVIDER_SITE_OTHER): Payer: Medicare Other | Admitting: "Endocrinology

## 2019-03-09 ENCOUNTER — Encounter (INDEPENDENT_AMBULATORY_CARE_PROVIDER_SITE_OTHER): Payer: Self-pay

## 2019-03-09 VITALS — Ht 62.0 in | Wt 199.0 lb

## 2019-03-09 VITALS — BP 100/70 | HR 96 | Temp 97.7°F | Ht 62.0 in | Wt 199.1 lb

## 2019-03-09 DIAGNOSIS — E118 Type 2 diabetes mellitus with unspecified complications: Secondary | ICD-10-CM | POA: Insufficient documentation

## 2019-03-09 DIAGNOSIS — E669 Obesity, unspecified: Secondary | ICD-10-CM

## 2019-03-09 DIAGNOSIS — E1165 Type 2 diabetes mellitus with hyperglycemia: Secondary | ICD-10-CM | POA: Diagnosis not present

## 2019-03-09 DIAGNOSIS — IMO0002 Reserved for concepts with insufficient information to code with codable children: Secondary | ICD-10-CM

## 2019-03-09 MED ORDER — METFORMIN HCL ER 500 MG PO TB24: 500.0000 mg | ORAL_TABLET | Freq: Two times a day (BID) | ORAL | 3 refills | Status: DC

## 2019-03-09 NOTE — Progress Notes (Signed)
Endocrinology follow-up note       03/09/2019, 12:57 PM   Subjective:    Patient ID: Erica Price, female    DOB: 12/23/1960.  Erica Price is being seen in follow-up for management of currently uncontrolled symptomatic diabetes requested by  [name], MD.   Past Medical History:  Diagnosis Date  . Apnea    Poss OSA  . Arthralgia   . Arthritis   . Depression   . Diabetes mellitus without complication ([place])   . Fatigue   . Fibromyalgia   . GERD (gastroesophageal reflux disease)   . IBS (irritable bowel syndrome)   . Myalgia   . Pinched nerve    lumbar-l5-[id number]  . PONV (postoperative nausea and vomiting)   . Snoring     Past Surgical History:  Procedure Laterality Date  . ARTHROSCOPIC REPAIR ACL Left   . BIOPSY N/A 06/12/2014   Procedure: BIOPSY;  Surgeon: Danie Binder, MD;  Location: [facility];  Service: Endoscopy;  Laterality: N/A;  . BIOPSY  09/14/2017   Procedure: BIOPSY;  Surgeon: Danie Binder, MD;  Location: [facility];  Service: Endoscopy;;  colon  . CARPAL TUNNEL RELEASE Right   . CHOLECYSTECTOMY  1990s  . CLUB FOOT RELEASE Left    x3  . COLONOSCOPY WITH PROPOFOL N/A 06/12/2014   Dr. Suzette Battiest colon polyps removed/moderate divertiiculosis/small internal hemorrhoids/moderate sized external hemorrhoids. tubular adenomas. Next surveillance in Oct 2018.   Marland Kitchen COLONOSCOPY WITH PROPOFOL N/A 09/14/2017   Dr. Oneida Alar: simple adenomas, moderate diverticulosis in rectosigmoid colon, sigmoid colon, and descending colon. External and internal hemorrhoids, normal colon biopsies. simple adenomas. Needs surveillance in 3 years with Propofol, colowrap  . ESOPHAGOGASTRODUODENOSCOPY (EGD) WITH PROPOFOL N/A 06/12/2014   Dr. 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 benign gastric and duodenal biopsies  . POLYPECTOMY N/A 06/12/2014   Procedure: POLYPECTOMY;   Surgeon: Danie Binder, MD;  Location: [facility];  Service: Endoscopy;  Laterality: N/A;  . POLYPECTOMY  09/14/2017   Procedure: POLYPECTOMY;  Surgeon: Danie Binder, MD;  Location: [facility];  Service: Endoscopy;;  colon  . SAVORY DILATION N/A 06/12/2014   Procedure: SAVORY DILATION;  Surgeon: Danie Binder, MD;  Location: [facility];  Service: Endoscopy;  Laterality: N/A;  12.8-17    Social History   Socioeconomic History  . Marital status: Married    Spouse name: Marcello Moores  . Number of children: 2  . Years of education: Not on file  . Highest education level: Associate degree: occupational, Hotel manager, or vocational program  Occupational History  . Occupation: disabled  Social Needs  . Financial resource strain: Not hard at all  . Food insecurity    Worry: Never true    Inability: Never true  . Transportation needs    Medical: No    Non-medical: No  Tobacco Use  . Smoking status: Former Smoker    Packs/day: 0.50    Years: 20.00    Pack years: 10.00    Types: Cigarettes    Quit date: 10/18/2011    Years since quitting: 7.3  . Smokeless tobacco:  Never Used  . Tobacco comment: quit in 2014  Substance and Sexual Activity  . Alcohol use: No    Frequency: Never  . Drug use: No  . Sexual activity: Not Currently    Birth control/protection: Post-menopausal  Lifestyle  . Physical activity    Days per week: 1 day    Minutes per session: 20 min  . Stress: Not at all  Relationships  . Social connections    Talks on phone: More than three times a week    Gets together: Once a week    Attends religious service: 1 to 4 times per year    Active member of club or organization: No    Attends meetings of clubs or organizations: Never    Relationship status: Married  Other Topics Concern  . Not on file  Social History Narrative   She is currently unemployed.  She was terminated on 06/15/2013.  She was previously a Loss adjuster, chartered, worked at [employer], and worked in Charity fundraiser for 25  years.   She lives with husband.  They have two children.    Family History  Problem Relation Age of Onset  . Heart failure Father        Died, 78  . Hypertension Mother        Living, 65  . Breast cancer Mother   . Kidney cancer Mother   . Hypercholesterolemia Son   . Breast cancer Sister   . Pancreatic cancer Brother        passed age 68  . Hypertension Sister   . Colon cancer Neg Hx     Outpatient Encounter Medications as of 03/09/2019  Medication Sig  . Cholecalciferol (VITAMIN D3) 5000 units CAPS 10,000 Units daily.   Marland Kitchen albuterol (PROVENTIL HFA;VENTOLIN HFA) 108 (90 Base) MCG/ACT inhaler Inhale 2 puffs into the lungs every 6 (six) hours as needed for wheezing or shortness of breath.  Marland Kitchen aluminum chloride (DRYSOL) 20 % external solution Apply topically as needed.   . Artificial Tear Solution (SOOTHE XP) SOLN Apply 1 drop to eye daily as needed (dry eyes).  Marland Kitchen dicyclomine (BENTYL) 10 MG capsule TAKE 1 CAPSULE BY MOUTH FOUR TIMES DAILY BEFORE MEALS AND AT BEDTIME FOR LOOSE STOOL AND ABDOMINAL CRAMPING.  . DULoxetine (CYMBALTA) 60 MG capsule Take 60 mg daily by mouth.   . fluticasone (FLONASE) 50 MCG/ACT nasal spray Place 2 sprays into both nostrils daily as needed for allergies or rhinitis.  . furosemide (LASIX) 20 MG tablet Take 20 mg by mouth daily as needed for fluid or edema.   . gabapentin (NEURONTIN) 300 MG capsule Take 600 mg by mouth 3 (three) times daily.  . insulin degludec (TRESIBA FLEXTOUCH) 100 UNIT/ML SOPN FlexTouch Pen Inject 0.2 mLs (20 Units total) into the skin at bedtime.  . Insulin Pen Needle (B-D ULTRAFINE III SHORT PEN) 31G X 8 MM MISC 1 each by Does not apply route as directed.  Marland Kitchen Ketorolac Tromethamine (TORADOL IJ) Inject as directed as needed. Gets at dr office  . lidocaine (LINDAMANTLE) 3 % CREA cream Apply 1 application topically as needed (joint pain).   . metFORMIN (GLUCOPHAGE XR) 500 MG 24 hr tablet Take 1 tablet (500 mg total) by mouth 2 (two) times  daily after a meal.  . NYAMYC powder   . pantoprazole (PROTONIX) 40 MG tablet Take 1 tablet (40 mg total) by mouth daily before breakfast.  . traMADol (ULTRAM) 50 MG tablet Take 50-100 mg by mouth every 6 (six) hours  as needed for moderate pain (depends on pain level if takes  1-2 tablets).   . TRULICITY 5.62 ZH/0.8MV SOPN Inject 0.75 mg into the skin once a week.  . zolpidem (AMBIEN) 10 MG tablet Take 5-10 mg by mouth at bedtime as needed for sleep (depends on insomnia if takes 0.5-1 tablet).   . [DISCONTINUED] metFORMIN (GLUCOPHAGE XR) 500 MG 24 hr tablet Take 1 tablet (500 mg total) by mouth daily after supper.   No facility-administered encounter medications on file as of 03/09/2019.     ALLERGIES: No Known Allergies  VACCINATION STATUS:  There is no immunization history on file for this patient.  Diabetes She presents for her follow-up diabetic visit. She has type 2 diabetes mellitus. Onset time: She was diagnosed at approximate age of 24 years. Her disease course has been worsening. There are no hypoglycemic associated symptoms. Pertinent negatives for hypoglycemia include no confusion, headaches, pallor or seizures. Associated symptoms include fatigue, polydipsia and polyuria. Pertinent negatives for diabetes include no chest pain and no polyphagia. There are no hypoglycemic complications. Symptoms are improving. There are no diabetic complications. Risk factors for coronary artery disease include diabetes mellitus, obesity, sedentary lifestyle, post-menopausal and tobacco exposure. Current diabetic treatments: She is on Invokana 300 mg p.o. daily, Trulicity 7.84 mg subcutaneously weekly. Her weight is decreasing steadily. She is following a generally unhealthy diet. When asked about meal planning, she reported none. She has not had a previous visit with a dietitian. She rarely participates in exercise. Her breakfast blood glucose range is generally 180-200 mg/dl. Her bedtime blood glucose  range is generally 180-200 mg/dl. Her overall blood glucose range is 180-200 mg/dl. An ACE inhibitor/angiotensin II receptor blocker is not being taken. She does not see a podiatrist.Eye exam is current.     Review of Systems  Constitutional: Positive for fatigue. Negative for chills, fever and unexpected weight change.  HENT: Negative for trouble swallowing and voice change.   Eyes: Negative for visual disturbance.  Respiratory: Negative for cough, shortness of breath and wheezing.   Cardiovascular: Negative for chest pain, palpitations and leg swelling.  Gastrointestinal: Negative for diarrhea, nausea and vomiting.  Endocrine: Positive for polydipsia and polyuria. Negative for cold intolerance, heat intolerance and polyphagia.  Musculoskeletal: Negative for arthralgias and myalgias.  Skin: Negative for color change, pallor, rash and wound.  Neurological: Negative for seizures and headaches.  Psychiatric/Behavioral: Negative for confusion and suicidal ideas.    Objective:    BP 100/70 (BP Location: Left Arm, Patient Position: Sitting, Cuff Size: Normal)   Pulse 96   Temp 97.7 F (36.5 C) (Oral)   Ht 5\' 2"  (1.575 m)   Wt 199 lb 1.2 oz (90.3 kg)   SpO2 98%   BMI 36.41 kg/m   Wt Readings from Last 3 Encounters:  03/09/19 199 lb 1.2 oz (90.3 kg)  01/25/19 203 lb (92.1 kg)  01/23/19 203 lb (92.1 kg)     Physical Exam Constitutional:      Appearance: She is well-developed.  HENT:     Head: Normocephalic and atraumatic.  Neck:     Musculoskeletal: Normal range of motion and neck supple.     Thyroid: No thyromegaly.     Trachea: No tracheal deviation.  Pulmonary:     Effort: Pulmonary effort is normal.  Abdominal:     Tenderness: There is no abdominal tenderness. There is no guarding.  Musculoskeletal: Normal range of motion.  Skin:    General: Skin is warm and dry.  Coloration: Skin is not pale.     Findings: No erythema or rash.  Neurological:     Mental Status:  She is alert and oriented to person, place, and time.     Cranial Nerves: No cranial nerve deficit.     Coordination: Coordination normal.     Deep Tendon Reflexes: Reflexes are normal and symmetric.  Psychiatric:        Mood and Affect: Mood normal.        Judgment: Judgment normal.       CMP ( most recent) CMP     Component Value Date/Time   NA 135 07/27/2017 0916   K 4.5 07/27/2017 0916   CL 103 07/27/2017 0916   CO2 23 07/27/2017 0916   GLUCOSE 185 (H) 07/27/2017 0916   BUN 22 (A) 11/14/2018   CREATININE 0.8 11/14/2018   CREATININE 1.01 (H) 07/27/2017 0916   CALCIUM 9.1 07/27/2017 0916   GFRNONAA >60 07/27/2017 0916   GFRAA >60 07/27/2017 0916     Recent Results (from the past 2160 hour(s))  Lipid Panel w/o Chol/HDL Ratio     Status: Abnormal   Collection Time: 02/28/19 10:48 AM  Result Value Ref Range   Cholesterol, Total 177 100 - 199 mg/dL   Triglycerides 194 (H) 0 - 149 mg/dL   HDL 41 >39 mg/dL   VLDL Cholesterol Cal 39 5 - 40 mg/dL   LDL Calculated 97 0 - 99 mg/dL  Hgb A1c w/o eAG     Status: Abnormal   Collection Time: 02/28/19 10:48 AM  Result Value Ref Range   Hgb A1c MFr Bld 8.8 (H) 4.8 - 5.6 %    Comment:          Prediabetes: 5.7 - 6.4          Diabetes: >6.4          Glycemic control for adults with diabetes: <7.0   T4, free     Status: None   Collection Time: 02/28/19 10:48 AM  Result Value Ref Range   Free T4 1.18 0.82 - 1.77 ng/dL  TSH     Status: None   Collection Time: 02/28/19 10:48 AM  Result Value Ref Range   TSH 1.690 0.450 - 4.500 uIU/mL  VITAMIN D 25 Hydroxy (Vit-D Deficiency, Fractures)     Status: None   Collection Time: 02/28/19 10:48 AM  Result Value Ref Range   Vit D, 25-Hydroxy 67.5 30.0 - 100.0 ng/mL    Comment: Vitamin D deficiency has been defined by the [place] and an Endocrine Society practice guideline as a level of serum 25-OH vitamin D less than 20 ng/mL (1,2). The Endocrine Society went on to further  define vitamin D insufficiency as a level between 21 and 29 ng/mL (2). 1. IOM (Institute of Medicine). 2010. Dietary reference    intakes for calcium and D. [place]: The    [employer]. 2. Holick MF, Binkley [place], Bischoff-Ferrari HA, et al.    Evaluation, treatment, and prevention of vitamin D    deficiency: an Endocrine Society clinical practice    guideline. JCEM. 2011 Jul; 96(7):1911-30.   Microalbumin, urine     Status: None   Collection Time: 02/28/19 10:48 AM  Result Value Ref Range   Microalbumin, Urine 11.3 Not Estab. ug/mL  Novel Coronavirus, NAA ([facility])     Status: None   Collection Time: 02/28/19 11:25 AM  Result Value Ref Range   SARS-CoV-2, NAA Not Detected Not Detected  Comment: This test was developed and its performance characteristics determined by [employer]. This test has not been FDA cleared or approved. This test has been authorized by FDA under an Emergency Use Authorization (EUA). This test is only authorized for the duration of time the declaration that circumstances exist justifying the authorization of the emergency use of in vitro diagnostic tests for detection of SARS-CoV-2 virus and/or diagnosis of COVID-19 infection under section 564(b)(1) of the Act, 21 U.S.C. 378HYI-5(O)(2), unless the authorization is terminated or revoked sooner. When diagnostic testing is negative, the possibility of a false negative result should be considered in the context of a patient's recent exposures and the presence of clinical signs and symptoms consistent with COVID-19. An individual without symptoms of COVID-19 and who is not shedding SARS-CoV-2 virus would expect to have a negative (not detected) result in this assay.       Assessment & Plan:   1. Uncontrolled type 2 diabetes mellitus with hyperglycemia ([place])  - KANAE IGNATOWSKI has currently uncontrolled symptomatic type 2 DM since  58 years of age. -She returns slightly better,  however still significantly above target glycemic profile.  Her recent labs show A1c of 8.8% unchanged from her last measurement.    -her diabetes is complicated by obesity/sedentary life and she remains at a high risk for more acute and chronic complications which include CAD, CVA, CKD, retinopathy, and neuropathy. These are all discussed in detail with her.  - I have counseled her on diet management and weight loss, by adopting a carbohydrate restricted/protein rich diet.  - she  admits there is a room for improvement in her diet and drink choices. -  Suggestion is made for her to avoid simple carbohydrates  from her diet including Cakes, Sweet Desserts / Pastries, Ice Cream, Soda (diet and regular), Sweet Tea, Candies, Chips, Cookies, Sweet Pastries,  Store Bought Juices, Alcohol in Excess of  1-2 drinks a day, Artificial Sweeteners, Coffee Creamer, and "Sugar-free" Products. This will help patient to have stable blood glucose profile and potentially avoid unintended weight gain.   - I encouraged her to switch to  unprocessed or minimally processed complex starch and increased protein intake (animal or plant source), fruits, and vegetables.  - she is advised to stick to a routine mealtimes to eat 3 meals  a day and avoid unnecessary snacks ( to snack only to correct hypoglycemia).   - she will be scheduled with Jearld Fenton, RDN, CDE for individualized diabetes education.  - I have approached her with the following individualized plan to manage diabetes and patient agrees:   -Based on her presenting glycemic profile, she will not need prandial insulin for now. -However, I discussed and increase her Tyler Aas to 30 units nightly,  associated with monitoring of blood glucose 2 times a day daily before breakfast and at bedtime.   - she is encouraged to call clinic for blood glucose levels less than 70 or above 300 mg /dl. - she is advised to continue Trulicity 7.74 mg subcutaneously weekly,  therapeutically suitable for patient .  She states that her insurance did not providing adequate coverage for her Trulicity costing her $128 every month, gave her some samples from clinic, will not prescribe it for her.  -She reports better tolerance to metformin.  She will benefit from a higher dose of metformin.  I advised her to increase metformin to 500 mg extended release twice a day after breakfast and supper.     - Patient  specific target  A1c;  LDL, HDL, Triglycerides, and  Waist Circumference were discussed in detail.  2) Blood Pressure /Hypertension:  her blood pressure is  controlled to target.   she is not on any antihypertensive medications.     3) Lipids/Hyperlipidemia:   Review of her recent lipid panel showed uncontrolled  LDL at 137 .  she  is not on any statins, unclear reasons.  She will be considered for low-dose statin medication on subsequent visits if no contraindications.   4)  Weight/Diet:  Body mass index is 36.41 kg/m.  -   clearly complicating her diabetes care.  I discussed with her the fact that loss of 5 - 10% of her  current body weight will have the most impact on her diabetes management.  CDE Consult will be initiated . Exercise, and detailed carbohydrates information provided  -  detailed on discharge instructions.  5) Chronic Care/Health Maintenance:  -she   is encouraged to initiate and continue to follow up with Ophthalmology, Dentist,  Podiatrist at least yearly or according to recommendations, and advised to   stay away from smoking. I have recommended yearly flu vaccine and pneumonia vaccine at least every 5 years; moderate intensity exercise for up to 150 minutes weekly; and  sleep for at least 7 hours a day.  - she is  advised to maintain close follow up with [name], MD for primary care needs, as well as her other providers for optimal and coordinated care. - Time spent with the patient: 25 min, of which >50% was spent in reviewing her  current  and  previous labs/studies, previous treatments, and medications doses and developing a plan for long-term care based on the latest recommendations for standards of care. Please refer to " Patient Self Inventory" in the Media  tab for reviewed elements of pertinent patient history. Erica Price participated in the discussions, expressed understanding, and voiced agreement with the above plans.  All questions were answered to her satisfaction. she is encouraged to contact clinic should she have any questions or concerns prior to her return visit.   Follow up plan: - Return in about 3 months (around 06/09/2019) for Follow up with Pre-visit Labs, Meter, and Logs.  Glade Lloyd, MD [facility] [address] [place], [place] [address] Phone: [phone]  Fax: [phone]    03/09/2019, 12:57 PM  This note was partially dictated with voice recognition software. Similar sounding words can be transcribed inadequately or may not  be corrected upon review.

## 2019-03-09 NOTE — Patient Instructions (Signed)

## 2019-03-09 NOTE — Progress Notes (Signed)
.   Medical Nutrition Therapy:  Appt start time: 1100 end time: 1130.   Assessment:  Primary concerns today: Diabetes Type 2 and Hyperlipidemia and Fibromyalgia.. Met with Dr. Dorris Fetch this am. Increased Toujeo to 30 units a day and increased Metformin ER 500 mg BID.  Saw Dr. Hilma Favors yesterday.  A1C 8.8%. She had set of twin brother and once just passed with pancreatice cancer and the other has had spells of pancreatitis. Working on what che is eating. Has cut out snacks and sweets. Drinking water. Has had some swelling in her feet. Started taking lasix when she is  Needed. Cholesterol levels are better. Has been using her exercise bike.  Lab Results  Component Value Date   HGBA1C 8.8 (H) 02/28/2019    She is willing and wanting to eat betterr to lose weight, and improve her DM.  CMP Latest Ref Rng & Units 11/14/2018 07/27/2017 06/07/2014  Glucose 65 - 99 mg/dL - 185(H) 145(H)  BUN 4 - 21 22(A) 17 18  Creatinine 0.5 - 1.1 0.8 1.01(H) 0.97  Sodium 135 - 145 mmol/L - 135 140  Potassium 3.5 - 5.1 mmol/L - 4.5 5.1  Chloride 101 - 111 mmol/L - 103 100  CO2 22 - 32 mmol/L - 23 27  Calcium 8.9 - 10.3 mg/dL - 9.1 9.4   Lipid Panel     Component Value Date/Time   CHOL 177 02/28/2019 1048   TRIG 194 (H) 02/28/2019 1048   HDL 41 02/28/2019 1048   LDLCALC 97 02/28/2019 1048    Preferred Learning Style:    No preference indicated   Learning Readiness:  Ready  Change in progress   MEDICATIONS:   DIETARY INTAKE:     24-hr recall:  B ( AM): 1/2 c special, k raspberry with little milk, fruit an coffee or reduce oatmeal Snk ( AM):   L ( PM):oss salad, - Snk ( PM):  D ( PM): Tacos 1 with flour tortilla,,water  Beverages: water  Usual physical activity: ADL   Estimated energy needs: 1200  calories 135 g carbohydrates 90 g protein 33 g fat  Progress Towards Goal(s):  In progress.   Nutritional Diagnosis:  NB-1.1 Food and nutrition-related knowledge deficit As  related to Diabetes Type 2,  Hyperlipidmia and Obeisty.  As evidenced by A1C 8.7%.    Intervention: Nutrition and Diabetes education provided on My Plate, CHO counting, meal planning, portion sizes, timing of meals, avoiding snacks between meals unless having a low blood sugar, target ranges for A1C and blood sugars, signs/symptoms and treatment of hyper/hypoglycemia, monitoring blood sugars, taking medications as prescribed, benefits of exercising 30 minutes per day and prevention of complications of DM.   Goals Keep up the great job!!  Eat 30 -45 grams of carbs per meal Increase exercise to 60 minutes 3 -4 times per week Keep measuring and sticking to meal plan Lose 1 lb per week Get A1C to 7% or less  Teaching Method Utilized:  Auditory   Handouts given during visit include:  Verbally went over carb counting  Barriers to learning/adherence to lifestyle change:  none  Demonstrated degree of understanding via:  Teach Back   Monitoring/Evaluation:  Dietary intake, exercise, , and body weight in 3 month(s).

## 2019-03-09 NOTE — Patient Instructions (Signed)
Goals Keep up the great job!!  Eat 30 -45 grams of carbs per meal Increase exercise to 60 minutes 3 -4 times per week Keep measuring and sticking to meal plan Lose 1 lb per week Get A1C to 7% or less

## 2019-03-20 ENCOUNTER — Telehealth: Payer: Self-pay

## 2019-03-20 MED ORDER — TRESIBA FLEXTOUCH 100 UNIT/ML ~~LOC~~ SOPN: 30.0000 [IU] | PEN_INJECTOR | Freq: Every day | SUBCUTANEOUS | 2 refills | Status: DC

## 2019-03-20 NOTE — Telephone Encounter (Signed)
Erica Price, CMA  

## 2019-05-15 DIAGNOSIS — H40033 Anatomical narrow angle, bilateral: Secondary | ICD-10-CM | POA: Diagnosis not present

## 2019-05-15 DIAGNOSIS — E119 Type 2 diabetes mellitus without complications: Secondary | ICD-10-CM | POA: Diagnosis not present

## 2019-06-06 ENCOUNTER — Other Ambulatory Visit: Payer: Self-pay | Admitting: "Endocrinology

## 2019-06-06 DIAGNOSIS — E1165 Type 2 diabetes mellitus with hyperglycemia: Secondary | ICD-10-CM | POA: Diagnosis not present

## 2019-06-07 LAB — COMPREHENSIVE METABOLIC PANEL
ALT: 11 IU/L (ref 0–32)
AST: 10 IU/L (ref 0–40)
Albumin/Globulin Ratio: 1.7 (ref 1.2–2.2)
Albumin: 4 g/dL (ref 3.8–4.9)
Alkaline Phosphatase: 75 IU/L (ref 39–117)
BUN/Creatinine Ratio: 25 — ABNORMAL HIGH (ref 9–23)
BUN: 21 mg/dL (ref 6–24)
Bilirubin Total: <0.2 mg/dL (ref 0.0–1.2)
CO2: 24 mmol/L (ref 20–29)
Calcium: 9 mg/dL (ref 8.7–10.2)
Chloride: 104 mmol/L (ref 96–106)
Creatinine, Ser: 0.84 mg/dL (ref 0.57–1.00)
GFR calc Af Amer: 90 mL/min/{1.73_m2} (ref >59)
GFR calc non Af Amer: 78 mL/min/{1.73_m2} (ref >59)
Globulin, Total: 2.4 g/dL (ref 1.5–4.5)
Glucose: 147 mg/dL — ABNORMAL HIGH (ref 65–99)
Potassium: 4.9 mmol/L (ref 3.5–5.2)
Sodium: 142 mmol/L (ref 134–144)
Total Protein: 6.4 g/dL (ref 6.0–8.5)

## 2019-06-07 LAB — SPECIMEN STATUS REPORT

## 2019-06-07 LAB — HGB A1C W/O EAG: Hgb A1c MFr Bld: 6.8 % — ABNORMAL HIGH (ref 4.8–5.6)

## 2019-06-12 DIAGNOSIS — G894 Chronic pain syndrome: Secondary | ICD-10-CM | POA: Diagnosis not present

## 2019-06-12 DIAGNOSIS — Z23 Encounter for immunization: Secondary | ICD-10-CM | POA: Diagnosis not present

## 2019-06-12 DIAGNOSIS — E7849 Other hyperlipidemia: Secondary | ICD-10-CM | POA: Diagnosis not present

## 2019-06-12 DIAGNOSIS — I10 Essential (primary) hypertension: Secondary | ICD-10-CM | POA: Diagnosis not present

## 2019-06-14 ENCOUNTER — Ambulatory Visit (INDEPENDENT_AMBULATORY_CARE_PROVIDER_SITE_OTHER): Payer: Medicare Other | Admitting: "Endocrinology

## 2019-06-14 ENCOUNTER — Other Ambulatory Visit: Payer: Self-pay

## 2019-06-14 ENCOUNTER — Encounter: Payer: Self-pay | Admitting: "Endocrinology

## 2019-06-14 DIAGNOSIS — E782 Mixed hyperlipidemia: Secondary | ICD-10-CM | POA: Insufficient documentation

## 2019-06-14 DIAGNOSIS — E1165 Type 2 diabetes mellitus with hyperglycemia: Secondary | ICD-10-CM

## 2019-06-14 MED ORDER — TRULICITY 1.5 MG/0.5ML ~~LOC~~ SOAJ: 1.5000 mg | SUBCUTANEOUS | 1 refills | Status: DC

## 2019-06-14 MED ORDER — ROSUVASTATIN CALCIUM 10 MG PO TABS: 10.0000 mg | ORAL_TABLET | Freq: Every day | ORAL | 1 refills | Status: DC

## 2019-06-14 NOTE — Progress Notes (Signed)
06/14/2019, 12:30 PM                                                    Endocrinology Telehealth Visit Follow up Note -During COVID -19 Pandemic  This visit type was conducted due to national recommendations for restrictions regarding the COVID-19 Pandemic  in an effort to limit this patient's exposure and mitigate transmission of the corona virus.  Due to her co-morbid illnesses, Erica Price is at  moderate to high risk for complications without adequate follow up.  This format is felt to be most appropriate for her at this time.  I connected with this patient on 06/14/2019   by telephone and verified that I am speaking with the correct person using two identifiers. Erica Price, [birth year]. she has verbally consented to this visit. All issues noted in this document were discussed and addressed. The format was not optimal for physical exam.    Subjective:    Patient ID: Erica Price, Erica Price    DOB: 26-Jun-1961.  Erica Price is being engaged in telehealth via telephone in follow-up for management of currently uncontrolled symptomatic diabetes requested by  [name], MD.   Past Medical History:  Diagnosis Date  . Apnea    Poss OSA  . Arthralgia   . Arthritis   . Depression   . Diabetes mellitus without complication ([place])   . Fatigue   . Fibromyalgia   . GERD (gastroesophageal reflux disease)   . IBS (irritable bowel syndrome)   . Myalgia   . Pinched nerve    lumbar-l5-s1  . PONV (postoperative nausea and vomiting)   . Snoring     Past Surgical History:  Procedure Laterality Date  . ARTHROSCOPIC REPAIR ACL Left   . BIOPSY N/A 06/12/2014   Procedure: BIOPSY;  Surgeon: Danie Binder, MD;  Location: [facility];  Service: Endoscopy;  Laterality: N/A;  . BIOPSY  09/14/2017   Procedure: BIOPSY;  Surgeon: Danie Binder, MD;  Location: [facility];  Service: Endoscopy;;  colon  . CARPAL  TUNNEL RELEASE Right   . CHOLECYSTECTOMY  1990s  . CLUB FOOT RELEASE Left    x3  . COLONOSCOPY WITH PROPOFOL N/A 06/12/2014   Dr. Suzette Battiest colon polyps removed/moderate divertiiculosis/small internal hemorrhoids/moderate sized external hemorrhoids. tubular adenomas. Next surveillance in Oct 2018.   Marland Kitchen COLONOSCOPY WITH PROPOFOL N/A 09/14/2017   Dr. Oneida Alar: simple adenomas, moderate diverticulosis in rectosigmoid colon, sigmoid colon, and descending colon. External and internal hemorrhoids, normal colon biopsies. simple adenomas. Needs surveillance in 3 years with Propofol, colowrap  . ESOPHAGOGASTRODUODENOSCOPY (EGD) WITH PROPOFOL N/A 06/12/2014   Dr. 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 benign gastric and duodenal biopsies  . POLYPECTOMY N/A 06/12/2014   Procedure: POLYPECTOMY;  Surgeon: Danie Binder, MD;  Location: [facility];  Service: Endoscopy;  Laterality: N/A;  . POLYPECTOMY  09/14/2017   Procedure: POLYPECTOMY;  Surgeon: Danie Binder, MD;  Location:  [facility];  Service: Endoscopy;;  colon  . SAVORY DILATION N/A 06/12/2014   Procedure: SAVORY DILATION;  Surgeon: Danie Binder, MD;  Location: [facility];  Service: Endoscopy;  Laterality: N/A;  12.8-17    Social History   Socioeconomic History  . Marital status: Married    Spouse name: Marcello Moores  . Number of children: 2  . Years of education: Not on file  . Highest education level: Associate degree: occupational, Hotel manager, or vocational program  Occupational History  . Occupation: disabled  Social Needs  . Financial resource strain: Not hard at all  . Food insecurity    Worry: Never true    Inability: Never true  . Transportation needs    Medical: No    Non-medical: No  Tobacco Use  . Smoking status: Former Smoker    Packs/day: 0.50    Years: 20.00    Pack years: 10.00    Types: Cigarettes    Quit date: 10/18/2011    Years since quitting: 7.6  . Smokeless tobacco: Never Used   . Tobacco comment: quit in 2014  Substance and Sexual Activity  . Alcohol use: No    Frequency: Never  . Drug use: No  . Sexual activity: Not Currently    Birth control/protection: Post-menopausal  Lifestyle  . Physical activity    Days per week: 1 day    Minutes per session: 20 min  . Stress: Not at all  Relationships  . Social connections    Talks on phone: More than three times a week    Gets together: Once a week    Attends religious service: 1 to 4 times per year    Active member of club or organization: No    Attends meetings of clubs or organizations: Never    Relationship status: Married  Other Topics Concern  . Not on file  Social History Narrative   She is currently unemployed.  She was terminated on 06/15/2013.  She was previously a Loss adjuster, chartered, worked at [employer], and worked in Charity fundraiser for 25 years.   She lives with husband.  They have two children.    Family History  Problem Relation Age of Onset  . Heart failure Father        Died, 78  . Hypertension Mother        Living, 39  . Breast cancer Mother   . Kidney cancer Mother   . Hypercholesterolemia Son   . Breast cancer Sister   . Pancreatic cancer Brother        passed age 30  . Hypertension Sister   . Colon cancer Neg Hx     Outpatient Encounter Medications as of 06/14/2019  Medication Sig  . albuterol (PROVENTIL HFA;VENTOLIN HFA) 108 (90 Base) MCG/ACT inhaler Inhale 2 puffs into the lungs every 6 (six) hours as needed for wheezing or shortness of breath.  Marland Kitchen aluminum chloride (DRYSOL) 20 % external solution Apply topically as needed.   . Artificial Tear Solution (SOOTHE XP) SOLN Apply 1 drop to eye daily as needed (dry eyes).  . Cholecalciferol (VITAMIN D3) 5000 units CAPS 10,000 Units daily.   Marland Kitchen dicyclomine (BENTYL) 10 MG capsule TAKE 1 CAPSULE BY MOUTH FOUR TIMES DAILY BEFORE MEALS AND AT BEDTIME FOR LOOSE STOOL AND ABDOMINAL CRAMPING.  . Dulaglutide (TRULICITY) 1.5 0000000 SOPN Inject 1.5 mg  into the skin once a week.  . DULoxetine (CYMBALTA) 60 MG capsule Take 60 mg daily by mouth.   . fluticasone (FLONASE)  50 MCG/ACT nasal spray Place 2 sprays into both nostrils daily as needed for allergies or rhinitis.  . furosemide (LASIX) 20 MG tablet Take 20 mg by mouth daily as needed for fluid or edema.   . gabapentin (NEURONTIN) 300 MG capsule Take 600 mg by mouth 3 (three) times daily.  . insulin degludec (TRESIBA FLEXTOUCH) 100 UNIT/ML SOPN FlexTouch Pen Inject 0.3 mLs (30 Units total) into the skin at bedtime.  . Insulin Pen Needle (B-D ULTRAFINE III SHORT PEN) 31G X 8 MM MISC 1 each by Does not apply route as directed.  Marland Kitchen Ketorolac Tromethamine (TORADOL IJ) Inject as directed as needed. Gets at dr office  . lidocaine (LINDAMANTLE) 3 % CREA cream Apply 1 application topically as needed (joint pain).   . metFORMIN (GLUCOPHAGE XR) 500 MG 24 hr tablet Take 1 tablet (500 mg total) by mouth 2 (two) times daily after a meal.  . NYAMYC powder   . pantoprazole (PROTONIX) 40 MG tablet Take 1 tablet (40 mg total) by mouth daily before breakfast.  . rosuvastatin (CRESTOR) 10 MG tablet Take 1 tablet (10 mg total) by mouth daily.  . traMADol (ULTRAM) 50 MG tablet Take 50-100 mg by mouth every 6 (six) hours as needed for moderate pain (depends on pain level if takes  1-2 tablets).   . zolpidem (AMBIEN) 10 MG tablet Take 5-10 mg by mouth at bedtime as needed for sleep (depends on insomnia if takes 0.5-1 tablet).   . [DISCONTINUED] TRULICITY A999333 0000000 SOPN Inject 0.75 mg into the skin once a week.   No facility-administered encounter medications on file as of 06/14/2019.     ALLERGIES: No Known Allergies  VACCINATION STATUS:  There is no immunization history on file for this patient.  Diabetes She presents for her follow-up diabetic visit. She has type 2 diabetes mellitus. Onset time: She was diagnosed at approximate age of 60 years. Her disease course has been improving. There are no  hypoglycemic associated symptoms. Pertinent negatives for hypoglycemia include no confusion, headaches, pallor or seizures. Associated symptoms include fatigue. Pertinent negatives for diabetes include no chest pain, no polydipsia, no polyphagia and no polyuria. There are no hypoglycemic complications. Symptoms are improving. There are no diabetic complications. Risk factors for coronary artery disease include diabetes mellitus, obesity, sedentary lifestyle, post-menopausal and tobacco exposure. Current diabetic treatments: She is on Invokana 300 mg p.o. daily, Trulicity A999333 mg subcutaneously weekly. Her weight is decreasing steadily. She is following a generally unhealthy diet. When asked about meal planning, she reported none. She has not had a previous visit with a dietitian. She rarely participates in exercise. Her breakfast blood glucose range is generally 140-180 mg/dl. Her bedtime blood glucose range is generally 180-200 mg/dl. Her overall blood glucose range is 180-200 mg/dl. An ACE inhibitor/angiotensin II receptor blocker is not being taken. She does not see a podiatrist.Eye exam is current.      Objective:    There were no vitals taken for this visit.  Wt Readings from Last 3 Encounters:  03/09/19 199 lb (90.3 kg)  03/09/19 199 lb 1.2 oz (90.3 kg)  01/25/19 203 lb (92.1 kg)     Physical Exam Constitutional:      Appearance: She is well-developed.  HENT:     Head: Normocephalic and atraumatic.  Neck:     Musculoskeletal: Normal range of motion and neck supple.     Thyroid: No thyromegaly.     Trachea: No tracheal deviation.  Pulmonary:  Effort: Pulmonary effort is normal.  Abdominal:     Tenderness: There is no abdominal tenderness. There is no guarding.  Musculoskeletal: Normal range of motion.  Skin:    General: Skin is warm and dry.     Coloration: Skin is not pale.     Findings: No erythema or rash.  Neurological:     Mental Status: She is alert and oriented to  person, place, and time.     Cranial Nerves: No cranial nerve deficit.     Coordination: Coordination normal.     Deep Tendon Reflexes: Reflexes are normal and symmetric.  Psychiatric:        Mood and Affect: Mood normal.        Judgment: Judgment normal.       CMP ( most recent) CMP     Component Value Date/Time   NA 142 06/06/2019 0951   K 4.9 06/06/2019 0951   CL 104 06/06/2019 0951   CO2 24 06/06/2019 0951   GLUCOSE 147 (H) 06/06/2019 0951   GLUCOSE 185 (H) 07/27/2017 0916   BUN 21 06/06/2019 0951   CREATININE 0.84 06/06/2019 0951   CALCIUM 9.0 06/06/2019 0951   PROT 6.4 06/06/2019 0951   ALBUMIN 4.0 06/06/2019 0951   AST 10 06/06/2019 0951   ALT 11 06/06/2019 0951   ALKPHOS 75 06/06/2019 0951   BILITOT <0.2 06/06/2019 0951   GFRNONAA 78 06/06/2019 0951   GFRAA 90 06/06/2019 0951     Recent Results (from the past 2160 hour(s))  Comprehensive metabolic panel     Status: Abnormal   Collection Time: 06/06/19  9:51 AM  Result Value Ref Range   Glucose 147 (H) 65 - 99 mg/dL   BUN 21 6 - 24 mg/dL   Creatinine, Ser 0.84 0.57 - 1.00 mg/dL   GFR calc non Af Amer 78 >59 mL/min/1.73   GFR calc Af Amer 90 >59 mL/min/1.73   BUN/Creatinine Ratio 25 (H) 9 - 23   Sodium 142 134 - 144 mmol/L   Potassium 4.9 3.5 - 5.2 mmol/L   Chloride 104 96 - 106 mmol/L   CO2 24 20 - 29 mmol/L   Calcium 9.0 8.7 - 10.2 mg/dL   Total Protein 6.4 6.0 - 8.5 g/dL   Albumin 4.0 3.8 - 4.9 g/dL   Globulin, Total 2.4 1.5 - 4.5 g/dL   Albumin/Globulin Ratio 1.7 1.2 - 2.2   Bilirubin Total <0.2 0.0 - 1.2 mg/dL   Alkaline Phosphatase 75 39 - 117 IU/L   AST 10 0 - 40 IU/L   ALT 11 0 - 32 IU/L  Hgb A1c w/o eAG     Status: Abnormal   Collection Time: 06/06/19  9:51 AM  Result Value Ref Range   Hgb A1c MFr Bld 6.8 (H) 4.8 - 5.6 %    Comment:          Prediabetes: 5.7 - 6.4          Diabetes: >6.4          Glycemic control for adults with diabetes: <7.0   Specimen status report     Status: None    Collection Time: 06/06/19  9:51 AM  Result Value Ref Range   specimen status report Comment     Comment: Isac Caddy CMP14 Default Ambig Abbrev CMP14 Default A hand-written panel/profile was received from your office. In accordance with the [facility] Ambiguous Test Code Policy dated July 123456, we have completed your order by using the closest currently  or formerly recognized AMA panel.  We have assigned Comprehensive Metabolic Panel (14), Test Code #322000 to this request.  If this is not the testing you wished to receive on this specimen, please contact the [facility] Client Inquiry/Technical Services Department to clarify the test order.  We appreciate your business.       Assessment & Plan:   1. Uncontrolled type 2 diabetes mellitus with hyperglycemia ([place])  - Erica Price has currently uncontrolled symptomatic type 2 DM since  58 years of age. -She reports significantly improved glycemic profile with A1c of 6.8% improving from 8.8%.   -I reviewed her recent lab with her. -her diabetes is complicated by obesity/sedentary life and she remains at a high risk for more acute and chronic complications which include CAD, CVA, CKD, retinopathy, and neuropathy. These are all discussed in detail with her.  - I have counseled her on diet management and weight loss, by adopting a carbohydrate restricted/protein rich diet.  - she  admits there is a room for improvement in her diet and drink choices. -  Suggestion is made for her to avoid simple carbohydrates  from her diet including Cakes, Sweet Desserts / Pastries, Ice Cream, Soda (diet and regular), Sweet Tea, Candies, Chips, Cookies, Sweet Pastries,  Store Bought Juices, Alcohol in Excess of  1-2 drinks a day, Artificial Sweeteners, Coffee Creamer, and "Sugar-free" Products. This will help patient to have stable blood glucose profile and potentially avoid unintended weight gain.   - I encouraged her to switch to  unprocessed or minimally  processed complex starch and increased protein intake (animal or plant source), fruits, and vegetables.  - she is advised to stick to a routine mealtimes to eat 3 meals  a day and avoid unnecessary snacks ( to snack only to correct hypoglycemia).   - she will be scheduled with Jearld Fenton, RDN, CDE for individualized diabetes education.  - I have approached her with the following individualized plan to manage diabetes and patient agrees:   -Based on her presenting glycemic profile, she will not need prandial insulin for now. -She is advised to continue Tresiba 30 units nightly,  associated with monitoring of blood glucose 2 times a day daily before breakfast and at bedtime.   - she is encouraged to call clinic for blood glucose levels less than 70 or above 300 mg /dl. -She has been receiving assistance from the manufacturer of Trulicity, I discussed and increase her Trulicity to 1.5 mg subcutaneously weekly, therapeutically suitable for patient .    -She reports better tolerance to metformin.  She is advised to continue  metformin to 500 mg extended release twice a day after breakfast and supper.     - Patient specific target  A1c;  LDL, HDL, Triglycerides, and  Waist Circumference were discussed in detail.  2) Blood Pressure /Hypertension: she is advised to home monitor blood pressure and report if > 140/90 on 2 separate readings.  she is not on any antihypertensive medications.    3) Lipids/Hyperlipidemia:   Review of her recent lipid panel showed uncontrolled  LDL at 97, was 137 before.  I discussed and initiated Crestor 10 mg p.o. nightly.  Side effects and precautions discussed with    4)  Weight/Diet: She has a BMI of 36-   clearly complicating her diabetes care.  I discussed with her the fact that loss of 5 - 10% of her  current body weight will have the most impact on her diabetes management.  CDE Consult will be initiated . Exercise, and detailed carbohydrates information  provided  -  detailed on discharge instructions.  5) Chronic Care/Health Maintenance:  -she   is encouraged to initiate and continue to follow up with Ophthalmology, Dentist,  Podiatrist at least yearly or according to recommendations, and advised to   stay away from smoking. I have recommended yearly flu vaccine and pneumonia vaccine at least every 5 years; moderate intensity exercise for up to 150 minutes weekly; and  sleep for at least 7 hours a day.  - she is  advised to maintain close follow up with [name], MD for primary care needs, as well as her other providers for optimal and coordinated care.  - Patient Care Time Today:  25 min, of which >50% was spent in  counseling and the rest reviewing her  current and  previous labs/studies, previous treatments, her blood glucose readings, and medications' doses and developing a plan for long-term care based on the latest recommendations for standards of care.   Erica Price participated in the discussions, expressed understanding, and voiced agreement with the above plans.  All questions were answered to her satisfaction. she is encouraged to contact clinic should she have any questions or concerns prior to her return visit.    Follow up plan: - Return in about 3 months (around 09/14/2019) for Bring Meter and Logs- A1c in Office, Include 8 log sheets.  Glade Lloyd, MD [facility] [address] [place], [place] [address] Phone: [phone]  Fax: [phone]    06/14/2019, 12:30 PM  This note was partially dictated with voice recognition software. Similar sounding words can be transcribed inadequately or may not  be corrected upon review.

## 2019-06-20 ENCOUNTER — Other Ambulatory Visit: Payer: Self-pay

## 2019-06-20 MED ORDER — TRULICITY 1.5 MG/0.5ML ~~LOC~~ SOAJ: 1.5000 mg | SUBCUTANEOUS | 3 refills | Status: DC

## 2019-06-23 DIAGNOSIS — E1165 Type 2 diabetes mellitus with hyperglycemia: Secondary | ICD-10-CM | POA: Diagnosis not present

## 2019-06-23 DIAGNOSIS — E782 Mixed hyperlipidemia: Secondary | ICD-10-CM | POA: Diagnosis not present

## 2019-06-23 DIAGNOSIS — M1990 Unspecified osteoarthritis, unspecified site: Secondary | ICD-10-CM | POA: Diagnosis not present

## 2019-07-05 ENCOUNTER — Other Ambulatory Visit: Payer: Self-pay | Admitting: Nurse Practitioner

## 2019-07-05 ENCOUNTER — Other Ambulatory Visit: Payer: Self-pay | Admitting: "Endocrinology

## 2019-07-24 DIAGNOSIS — I10 Essential (primary) hypertension: Secondary | ICD-10-CM | POA: Diagnosis not present

## 2019-07-24 DIAGNOSIS — E1165 Type 2 diabetes mellitus with hyperglycemia: Secondary | ICD-10-CM | POA: Diagnosis not present

## 2019-07-24 DIAGNOSIS — E7849 Other hyperlipidemia: Secondary | ICD-10-CM | POA: Diagnosis not present

## 2019-07-24 DIAGNOSIS — M1990 Unspecified osteoarthritis, unspecified site: Secondary | ICD-10-CM | POA: Diagnosis not present

## 2019-07-30 NOTE — Progress Notes (Signed)
REVIEWED-NO ADDITIONAL RECOMMENDATIONS. 

## 2019-08-03 ENCOUNTER — Other Ambulatory Visit: Payer: Self-pay | Admitting: Gastroenterology

## 2019-08-21 ENCOUNTER — Other Ambulatory Visit: Payer: Self-pay | Admitting: "Endocrinology

## 2019-08-24 DIAGNOSIS — M1990 Unspecified osteoarthritis, unspecified site: Secondary | ICD-10-CM | POA: Diagnosis not present

## 2019-08-24 DIAGNOSIS — G894 Chronic pain syndrome: Secondary | ICD-10-CM | POA: Diagnosis not present

## 2019-09-14 DIAGNOSIS — E7849 Other hyperlipidemia: Secondary | ICD-10-CM | POA: Diagnosis not present

## 2019-09-14 DIAGNOSIS — E039 Hypothyroidism, unspecified: Secondary | ICD-10-CM | POA: Diagnosis not present

## 2019-09-14 DIAGNOSIS — Z0001 Encounter for general adult medical examination with abnormal findings: Secondary | ICD-10-CM | POA: Diagnosis not present

## 2019-09-14 DIAGNOSIS — E1165 Type 2 diabetes mellitus with hyperglycemia: Secondary | ICD-10-CM | POA: Diagnosis not present

## 2019-09-14 DIAGNOSIS — I10 Essential (primary) hypertension: Secondary | ICD-10-CM | POA: Diagnosis not present

## 2019-09-14 DIAGNOSIS — M797 Fibromyalgia: Secondary | ICD-10-CM | POA: Diagnosis not present

## 2019-09-14 DIAGNOSIS — Z1389 Encounter for screening for other disorder: Secondary | ICD-10-CM | POA: Diagnosis not present

## 2019-09-14 DIAGNOSIS — G894 Chronic pain syndrome: Secondary | ICD-10-CM | POA: Diagnosis not present

## 2019-09-14 LAB — HEMOGLOBIN A1C: Hemoglobin A1C: 7.2

## 2019-09-15 ENCOUNTER — Other Ambulatory Visit: Payer: Self-pay

## 2019-09-15 ENCOUNTER — Encounter: Payer: Self-pay | Admitting: "Endocrinology

## 2019-09-15 ENCOUNTER — Ambulatory Visit (INDEPENDENT_AMBULATORY_CARE_PROVIDER_SITE_OTHER): Payer: Medicare Other | Admitting: "Endocrinology

## 2019-09-15 VITALS — BP 134/81 | HR 72 | Ht 62.0 in | Wt 205.0 lb

## 2019-09-15 DIAGNOSIS — E1165 Type 2 diabetes mellitus with hyperglycemia: Secondary | ICD-10-CM | POA: Diagnosis not present

## 2019-09-15 DIAGNOSIS — E782 Mixed hyperlipidemia: Secondary | ICD-10-CM

## 2019-09-15 NOTE — Progress Notes (Signed)
09/15/2019, 3:19 PM  Endocrinology follow-up note   Subjective:    Patient ID: Erica Price, female    DOB: 02-Sep-1960.  Erica Price is being seen  in follow-up for management of currently uncontrolled symptomatic diabetes requested by  [name], MD.   Past Medical History:  Diagnosis Date  . Apnea    Poss OSA  . Arthralgia   . Arthritis   . Depression   . Diabetes mellitus without complication ([place])   . Fatigue   . Fibromyalgia   . GERD (gastroesophageal reflux disease)   . IBS (irritable bowel syndrome)   . Myalgia   . Pinched nerve    [id number]-[id number]  . PONV (postoperative nausea and vomiting)   . Snoring     Past Surgical History:  Procedure Laterality Date  . ARTHROSCOPIC REPAIR ACL Left   . BIOPSY N/A 06/12/2014   Procedure: BIOPSY;  Surgeon: Danie Binder, MD;  Location: [facility];  Service: Endoscopy;  Laterality: N/A;  . BIOPSY  09/14/2017   Procedure: BIOPSY;  Surgeon: Danie Binder, MD;  Location: [facility];  Service: Endoscopy;;  colon  . CARPAL TUNNEL RELEASE Right   . CHOLECYSTECTOMY  1990s  . CLUB FOOT RELEASE Left    x3  . COLONOSCOPY WITH PROPOFOL N/A 06/12/2014   Dr. Suzette Battiest colon polyps removed/moderate divertiiculosis/small internal hemorrhoids/moderate sized external hemorrhoids. tubular adenomas. Next surveillance in Oct 2018.   Marland Kitchen COLONOSCOPY WITH PROPOFOL N/A 09/14/2017   Dr. Oneida Alar: simple adenomas, moderate diverticulosis in rectosigmoid colon, sigmoid colon, and descending colon. External and internal hemorrhoids, normal colon biopsies. simple adenomas. Needs surveillance in 3 years with Propofol, colowrap  . ESOPHAGOGASTRODUODENOSCOPY (EGD) WITH PROPOFOL N/A 06/12/2014   Dr. 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 benign gastric and duodenal biopsies  . POLYPECTOMY N/A 06/12/2014   Procedure: POLYPECTOMY;   Surgeon: Danie Binder, MD;  Location: [facility];  Service: Endoscopy;  Laterality: N/A;  . POLYPECTOMY  09/14/2017   Procedure: POLYPECTOMY;  Surgeon: Danie Binder, MD;  Location: [facility];  Service: Endoscopy;;  colon  . SAVORY DILATION N/A 06/12/2014   Procedure: SAVORY DILATION;  Surgeon: Danie Binder, MD;  Location: [facility];  Service: Endoscopy;  Laterality: N/A;  12.8-17    Social History   Socioeconomic History  . Marital status: Married    Spouse name: Marcello Moores  . Number of children: 2  . Years of education: Not on file  . Highest education level: Associate degree: occupational, Hotel manager, or vocational program  Occupational History  . Occupation: disabled  Tobacco Use  . Smoking status: Former Smoker    Packs/day: 0.50    Years: 20.00    Pack years: 10.00    Types: Cigarettes    Quit date: 10/18/2011    Years since quitting: 7.9  . Smokeless tobacco: Never Used  . Tobacco comment: quit in 2014  Substance and Sexual Activity  . Alcohol use: No  . Drug use: No  . Sexual activity: Not Currently    Birth control/protection: Post-menopausal  Other Topics Concern  . Not on file  Social History Narrative   She is currently unemployed.  She was terminated on 06/15/2013.  She was previously a Loss adjuster, chartered, worked at [employer], and worked in Charity fundraiser for 25 years.   She lives with husband.  They have two children.   Social Determinants of Health   Financial Resource Strain:   . Difficulty of Paying Living Expenses: Not on file  Food Insecurity:   . Worried About Charity fundraiser in the Last Year: Not on file  . Ran Out of Food in the Last Year: Not on file  Transportation Needs:   . Lack of Transportation (Medical): Not on file  . Lack of Transportation (Non-Medical): Not on file  Physical Activity:   . Days of Exercise per Week: Not on file  . Minutes of Exercise per Session: Not on file  Stress:   . Feeling of Stress : Not on file  Social Connections:    . Frequency of Communication with Friends and Family: Not on file  . Frequency of Social Gatherings with Friends and Family: Not on file  . Attends Religious Services: Not on file  . Active Member of Clubs or Organizations: Not on file  . Attends Archivist Meetings: Not on file  . Marital Status: Not on file    Family History  Problem Relation Age of Onset  . Heart failure Father        Died, 78  . Hypertension Mother        Living, 47  . Breast cancer Mother   . Kidney cancer Mother   . Hypercholesterolemia Son   . Breast cancer Sister   . Pancreatic cancer Brother        passed age 56  . Hypertension Sister   . Colon cancer Neg Hx     Outpatient Encounter Medications as of 09/15/2019  Medication Sig  . albuterol (PROVENTIL HFA;VENTOLIN HFA) 108 (90 Base) MCG/ACT inhaler Inhale 2 puffs into the lungs every 6 (six) hours as needed for wheezing or shortness of breath.  Marland Kitchen aluminum chloride (DRYSOL) 20 % external solution Apply topically as needed.   . Artificial Tear Solution (SOOTHE XP) SOLN Apply 1 drop to eye daily as needed (dry eyes).  . Cholecalciferol (VITAMIN D3) 5000 units CAPS 10,000 Units daily.   Marland Kitchen dicyclomine (BENTYL) 10 MG capsule TAKE 1 CAPSULE BY MOUTH FOUR TIMES DAILY BEFORE MEALS AND AT BEDTIME FOR LOOSE STOOL AND ABDOMINAL CRAMPING.  . Dulaglutide (TRULICITY) 1.5 0000000 SOPN Inject 1.5 mg into the skin once a week.  . DULoxetine (CYMBALTA) 60 MG capsule Take 60 mg daily by mouth.   . fluticasone (FLONASE) 50 MCG/ACT nasal spray Place 2 sprays into both nostrils daily as needed for allergies or rhinitis.  . furosemide (LASIX) 20 MG tablet Take 20 mg by mouth daily as needed for fluid or edema.   . gabapentin (NEURONTIN) 300 MG capsule Take 600 mg by mouth 3 (three) times daily.  . Insulin Pen Needle (B-D ULTRAFINE III SHORT PEN) 31G X 8 MM MISC 1 each by Does not apply route as directed.  Marland Kitchen Ketorolac Tromethamine (TORADOL IJ) Inject as directed as  needed. Gets at dr office  . lidocaine (LINDAMANTLE) 3 % CREA cream Apply 1 application topically as needed (joint pain).   . metFORMIN (GLUCOPHAGE-XR) 500 MG 24 hr tablet TAKE 1 TABLET BY MOUTH TWICE DAILY AFTER A MEAL.  Marland Kitchen NYAMYC powder   . pantoprazole (PROTONIX) 40 MG tablet Take 1 tablet (40 mg  total) by mouth daily before breakfast.  . rosuvastatin (CRESTOR) 10 MG tablet Take 1 tablet (10 mg total) by mouth daily.  . traMADol (ULTRAM) 50 MG tablet Take 50-100 mg by mouth every 6 (six) hours as needed for moderate pain (depends on pain level if takes  1-2 tablets).   . TRESIBA FLEXTOUCH 100 UNIT/ML SOPN FlexTouch Pen INJECT 30 UNITS INTO THE SKIN AT BEDTIME.  Marland Kitchen zolpidem (AMBIEN) 10 MG tablet Take 5-10 mg by mouth at bedtime as needed for sleep (depends on insomnia if takes 0.5-1 tablet).    No facility-administered encounter medications on file as of 09/15/2019.    ALLERGIES: No Known Allergies  VACCINATION STATUS:  There is no immunization history on file for this patient.  Diabetes She presents for her follow-up diabetic visit. She has type 2 diabetes mellitus. Onset time: She was diagnosed at approximate age of 60 years. Her disease course has been worsening. There are no hypoglycemic associated symptoms. Pertinent negatives for hypoglycemia include no confusion, headaches, pallor or seizures. Associated symptoms include fatigue. Pertinent negatives for diabetes include no chest pain, no polydipsia, no polyphagia and no polyuria. There are no hypoglycemic complications. Symptoms are worsening. There are no diabetic complications. Risk factors for coronary artery disease include diabetes mellitus, obesity, sedentary lifestyle, post-menopausal and tobacco exposure. Current diabetic treatments: She is on Invokana 300 mg p.o. daily, Trulicity [id number] mg subcutaneously weekly. Her weight is increasing steadily. She is following a generally unhealthy diet. When asked about meal planning, she  reported none. She has not had a previous visit with a dietitian. She rarely participates in exercise. Her breakfast blood glucose range is generally 140-180 mg/dl. Her bedtime blood glucose range is generally 180-200 mg/dl. Her overall blood glucose range is 180-200 mg/dl. An ACE inhibitor/angiotensin II receptor blocker is not being taken. She does not see a podiatrist.Eye exam is current.      Objective:    BP 134/81   Pulse 72   Ht 5\' 2"  (1.575 m)   Wt 205 lb (93 kg)   BMI 37.49 kg/m   Wt Readings from Last 3 Encounters:  09/15/19 205 lb (93 kg)  03/09/19 199 lb (90.3 kg)  03/09/19 199 lb 1.2 oz (90.3 kg)     Physical Exam Constitutional:      Appearance: She is well-developed.  HENT:     Head: Normocephalic and atraumatic.  Neck:     Thyroid: No thyromegaly.     Trachea: No tracheal deviation.  Pulmonary:     Effort: Pulmonary effort is normal.  Abdominal:     Tenderness: There is no abdominal tenderness. There is no guarding.  Musculoskeletal:        General: Normal range of motion.     Cervical back: Normal range of motion and neck supple.  Skin:    General: Skin is warm and dry.     Coloration: Skin is not pale.     Findings: No erythema or rash.  Neurological:     Mental Status: She is alert and oriented to person, place, and time.     Cranial Nerves: No cranial nerve deficit.     Coordination: Coordination normal.     Deep Tendon Reflexes: Reflexes are normal and symmetric.  Psychiatric:        Mood and Affect: Mood normal.        Judgment: Judgment normal.      CMP ( most recent) CMP     Component Value Date/Time  NA 142 06/06/2019 0951   K 4.9 06/06/2019 0951   CL 104 06/06/2019 0951   CO2 24 06/06/2019 0951   GLUCOSE 147 (H) 06/06/2019 0951   GLUCOSE 185 (H) 07/27/2017 0916   BUN 21 06/06/2019 0951   CREATININE 0.84 06/06/2019 0951   CALCIUM 9.0 06/06/2019 0951   PROT 6.4 06/06/2019 0951   ALBUMIN 4.0 06/06/2019 0951   AST 10 06/06/2019  0951   ALT 11 06/06/2019 0951   ALKPHOS 75 06/06/2019 0951   BILITOT <0.2 06/06/2019 0951   GFRNONAA 78 06/06/2019 0951   GFRAA 90 06/06/2019 0951    Lipid Panel     Component Value Date/Time   CHOL 177 02/28/2019 1048   TRIG 194 (H) 02/28/2019 1048   HDL 41 02/28/2019 1048   LDLCALC 97 02/28/2019 1048   LABVLDL 39 02/28/2019 1048   Results for KAILYE, AREBALO (MRN [id number]) as of 09/15/2019 15:19  Ref. Range 02/28/2019 10:48 06/06/2019 09:51 09/14/2019 00:00  Glucose Latest Ref Range: 65 - 99 mg/dL  147 (H)   Hemoglobin A1C Unknown 8.8 (H) 6.8 (H) 7.2  TSH Latest Ref Range: 0.450 - 4.500 uIU/mL 1.690    T4,Free(Direct) Latest Ref Range: 0.82 - 1.77 ng/dL 1.18      Assessment & Plan:   1. Uncontrolled type 2 diabetes mellitus with hyperglycemia ([place])  - Erica Price has currently uncontrolled symptomatic type 2 DM since  59 years of age. -She comes with slightly above target glycemic profile postprandially.  Her A1c is reported to be 7.2% from her PCPs office.   Her logs are reviewed with her. -her diabetes is complicated by obesity/sedentary life and she remains at a high risk for more acute and chronic complications which include CAD, CVA, CKD, retinopathy, and neuropathy. These are all discussed in detail with her.  - I have counseled her on diet management and weight loss, by adopting a carbohydrate restricted/protein rich diet.  - she  admits there is a room for improvement in her diet and drink choices. -  Suggestion is made for her to avoid simple carbohydrates  from her diet including Cakes, Sweet Desserts / Pastries, Ice Cream, Soda (diet and regular), Sweet Tea, Candies, Chips, Cookies, Sweet Pastries,  Store Bought Juices, Alcohol in Excess of  1-2 drinks a day, Artificial Sweeteners, Coffee Creamer, and "Sugar-free" Products. This will help patient to have stable blood glucose profile and potentially avoid unintended weight gain.   - I encouraged her to switch to   unprocessed or minimally processed complex starch and increased protein intake (animal or plant source), fruits, and vegetables.  - she is advised to stick to a routine mealtimes to eat 3 meals  a day and avoid unnecessary snacks ( to snack only to correct hypoglycemia).   - she will be scheduled with Jearld Fenton, RDN, CDE for individualized diabetes education.  - I have approached her with the following individualized plan to manage diabetes and patient agrees:   -Based on her presenting glycemic profile, she will not need prandial insulin for now. -She is advised to increase her Tresiba to 40 units nightly,   associated with monitoring of blood glucose 2 times a day daily before breakfast and at bedtime.   - she is encouraged to call clinic for blood glucose levels less than 70 or above 300 mg /dl. -She has been receiving assistance from the manufacturer of Trulicity, she is advised to continue her Trulicity to 1.5 mg subcutaneously weekly, therapeutically suitable  for patient .    -She reports better tolerance to metformin.  She is advised to continue  metformin to 500 mg extended release twice a day after breakfast and supper.     - Patient specific target  A1c;  LDL, HDL, Triglycerides,  were discussed in detail.  2) Blood Pressure /Hypertension: Her blood pressure is controlled to target.  she is not on any antihypertensive medications.    3) Lipids/Hyperlipidemia:   Review of her recent lipid panel showed uncontrolled  LDL at 97, was 137 before.  She is advised to continue Crestor 10 mg p.o. nightly.  Side effects and precautions discussed with    4)  Weight/Diet: She has a BMI of 36-   clearly complicating her diabetes care.  I discussed with her the fact that loss of 5 - 10% of her  current body weight will have the most impact on her diabetes management.  CDE Consult will be initiated . Exercise, and detailed carbohydrates information provided  -  detailed on discharge  instructions.  5) Chronic Care/Health Maintenance:  -she   is encouraged to initiate and continue to follow up with Ophthalmology, Dentist,  Podiatrist at least yearly or according to recommendations, and advised to   stay away from smoking. I have recommended yearly flu vaccine and pneumonia vaccine at least every 5 years; moderate intensity exercise for up to 150 minutes weekly; and  sleep for at least 7 hours a day.  - she is  advised to maintain close follow up with [name], MD for primary care needs, as well as her other providers for optimal and coordinated care.   - Time spent on this patient care encounter:  35 min, of which > 50% was spent in  counseling and the rest reviewing her blood glucose logs , discussing her hypoglycemia and hyperglycemia episodes, reviewing her current and  previous labs / studies  ( including abstraction from other facilities) and medications  doses and developing a  long term treatment plan and documenting her care.   Please refer to Patient Instructions for Blood Glucose Monitoring and Insulin/Medications Dosing Guide"  in media tab for additional information. Please  also refer to " Patient Self Inventory" in the Media  tab for reviewed elements of pertinent patient history.  Allena Katz participated in the discussions, expressed understanding, and voiced agreement with the above plans.  All questions were answered to her satisfaction. she is encouraged to contact clinic should she have any questions or concerns prior to her return visit.   Follow up plan: - Return in about 4 months (around 01/13/2020) for Bring Meter and Logs- A1c in Office.  Glade Lloyd, MD [facility] [address] [place], [place] [address] Phone: [phone]  Fax: [phone]    09/15/2019, 3:19 PM  This note was partially dictated with voice recognition software. Similar sounding words can be transcribed inadequately  or may not  be corrected upon review.

## 2019-09-15 NOTE — Patient Instructions (Signed)
                                     Advice for Weight Management  -For most of us the best way to lose weight is by diet management. Generally speaking, diet management means consuming less calories intentionally which over time brings about progressive weight loss.  This can be achieved more effectively by restricting carbohydrate consumption to the minimum possible.  So, it is critically important to know your numbers: how much calorie you are consuming and how much calorie you need. More importantly, our carbohydrates sources should be unprocessed or minimally processed complex starch food items.   Sometimes, it is important to balance nutrition by increasing protein intake (animal or plant source), fruits, and vegetables.  -Sticking to a routine mealtime to eat 3 meals a day and avoiding unnecessary snacks is shown to have a big role in weight control. Under normal circumstances, the only time we lose real weight is when we are hungry, so allow hunger to take place- hunger means no food between meal times, only water.  It is not advisable to starve.   -It is better to avoid simple carbohydrates including: Cakes, Sweet Desserts, Ice Cream, Soda (diet and regular), Sweet Tea, Candies, Chips, Cookies, Store Bought Juices, Alcohol in Excess of  1-2 drinks a day, Artificial Sweeteners, Doughnuts, Coffee Creamers, "Sugar-free" Products, etc, etc.  This is not a complete list.....    -Consulting with certified diabetes educators is proven to provide you with the most accurate and current information on diet.  Also, you may be  interested in discussing diet options/exchanges , we can schedule a visit with Penny Crumpton, RDN, CDE for individualized nutrition education.  -Exercise: If you are able: 30 -60 minutes a day ,4 days a week, or 150 minutes a week.  The longer the better.  Combine stretch, strength, and aerobic activities.  If you were told in the past that you  have high risk for cardiovascular diseases, you may seek evaluation by your heart doctor prior to initiating moderate to intense exercise programs.                                  Additional Care Considerations for Diabetes   -Diabetes  is a chronic disease.  The most important care consideration is regular follow-up with your diabetes care provider with the goal being avoiding or delaying its complications and to take advantage of advances in medications and technology.    -Type 2 diabetes is known to coexist with other important comorbidities such as high blood pressure and high cholesterol.  It is critical to control not only the diabetes but also the high blood pressure and high cholesterol to minimize and delay the risk of complications including coronary artery disease, stroke, amputations, blindness, etc.    - Studies showed that people with diabetes will benefit from a class of medications known as ACE inhibitors and statins.  Unless there are specific reasons not to be on these medications, the standard of care is to consider getting one from these groups of medications at an optimal doses.  These medications are generally considered safe and proven to help protect the heart and the kidneys.    - People with diabetes are encouraged to initiate and maintain regular follow-up with eye doctors, foot doctors, dentists ,   and if necessary heart and kidney doctors.     - It is highly recommended that people with diabetes quit smoking or stay away from smoking, and get yearly  flu vaccine and pneumonia vaccine at least every 5 years.  One other important lifestyle recommendation is to ensure adequate sleep - at least 6-7 hours of uninterrupted sleep at night.  -Exercise: If you are able: 30 -60 minutes a day, 4 days a week, or 150 minutes a week.  The longer the better.  Combine stretch, strength, and aerobic activities.  If you were told in the past that you have high risk for cardiovascular  diseases, you may seek evaluation by your heart doctor prior to initiating moderate to intense exercise programs.     COVID-19 Vaccine Information can be found at: [email or web address] For questions related to vaccine distribution or appointments, please email [email or web address] or call [phone].        

## 2019-09-24 DIAGNOSIS — M1990 Unspecified osteoarthritis, unspecified site: Secondary | ICD-10-CM | POA: Diagnosis not present

## 2019-09-24 DIAGNOSIS — G894 Chronic pain syndrome: Secondary | ICD-10-CM | POA: Diagnosis not present

## 2019-09-26 ENCOUNTER — Encounter: Payer: Self-pay | Admitting: Gastroenterology

## 2019-09-27 ENCOUNTER — Other Ambulatory Visit: Payer: Self-pay | Admitting: "Endocrinology

## 2019-09-28 ENCOUNTER — Telehealth: Payer: Self-pay | Admitting: "Endocrinology

## 2019-09-28 MED ORDER — TRESIBA FLEXTOUCH 100 UNIT/ML ~~LOC~~ SOPN: 40.0000 [IU] | PEN_INJECTOR | Freq: Every day | SUBCUTANEOUS | 0 refills | Status: DC

## 2019-09-28 NOTE — Telephone Encounter (Signed)
Sent rx for increased dose of 40 units.

## 2019-09-28 NOTE — Telephone Encounter (Signed)
Pt said Dr Dorris Fetch increased her TRESIBA FLEXTOUCH 100 UNIT/ML SOPN FlexTouch Pen to 40 units and the pharmacy has it for 30 units. Can you re send it?

## 2019-09-29 ENCOUNTER — Other Ambulatory Visit: Payer: Self-pay | Admitting: "Endocrinology

## 2019-10-10 ENCOUNTER — Other Ambulatory Visit: Payer: Self-pay | Admitting: Gastroenterology

## 2019-10-16 ENCOUNTER — Other Ambulatory Visit (HOSPITAL_COMMUNITY): Payer: Self-pay | Admitting: Family Medicine

## 2019-10-16 DIAGNOSIS — Z1231 Encounter for screening mammogram for malignant neoplasm of breast: Secondary | ICD-10-CM

## 2019-10-22 DIAGNOSIS — G894 Chronic pain syndrome: Secondary | ICD-10-CM | POA: Diagnosis not present

## 2019-10-22 DIAGNOSIS — M1990 Unspecified osteoarthritis, unspecified site: Secondary | ICD-10-CM | POA: Diagnosis not present

## 2019-10-30 ENCOUNTER — Ambulatory Visit (HOSPITAL_COMMUNITY)
Admission: RE | Admit: 2019-10-30 | Discharge: 2019-10-30 | Disposition: A | Discharge status: Home / Self Care | Payer: Medicare Other | Source: Ambulatory Visit | Attending: Family Medicine | Admitting: Family Medicine

## 2019-10-30 ENCOUNTER — Other Ambulatory Visit: Payer: Self-pay

## 2019-10-30 DIAGNOSIS — Z1231 Encounter for screening mammogram for malignant neoplasm of breast: Secondary | ICD-10-CM | POA: Insufficient documentation

## 2019-11-01 NOTE — Progress Notes (Signed)
Referring Provider: [name], MD Primary Care Physician:  [name], MD Primary GI: Dr. Oneida Alar   Chief Complaint  Patient presents with  . Gastroesophageal Reflux    "little bit"    HPI:   ETTEL SIPOS is a 59 y.o. female presenting today with a history of GERD and diarrhea. Jan 2019 completed colonoscopy with simple adenomas, normal colonic biopsies. Needs surveillance in 2022 due to history of polyps (with Propofol). 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 Has done well with Bentyl for looser stool, which is multifactorial in setting of metformin, diabetes, post-cholecystectomy state.  Has had episodes of nocturnal GERD. Once was severe, and the other time was not as bad. Felt acid in her throat, voice sore. Nauseated the second time. Protonix once daily. Tries to eat before 7pm. Other than the 2 episodes, she has done ok. Getting strangled with eating. Doesn't feel like food is getting hung. Occurs with any food. Present for 3 months. Feels bloated after eating. Gets hard in upper abdomen like a brick with bloating. Burping or BM will help. Taking 2 gas-x at night before eating. Eats onions. Trying to stay away from dried beans. Eats lots of green beans and carrots. Cabbage in slaw but rare. No carbonated drinks. Drinks bottled water and 2 cups of coffee a day. Doesn't fry food. HOB elevated.    Has been having abdominal cramps, starting in RUQ and radiating down. Cramps randomly occur. Painful. Stools are Bristol scale #5 and 7. Bentyl 4 times per day. Was experiencing some 4s. No diet changes. Crestor added. Diabetes medication adjusted. Blood sugars have ranged from 89 to a high of 160. Doesn't drink milk. Lurline Hare is non-dairy. Eats cheese that is in a food or casserole. Taking Gas-x for bloating/gas.   Past Medical History:  Diagnosis Date  . Apnea    Poss OSA  . Arthralgia   . Arthritis   . Depression   . Diabetes mellitus without complication ([place])    . Fatigue   . Fibromyalgia   . GERD (gastroesophageal reflux disease)   . IBS (irritable bowel syndrome)   . Myalgia   . Pinched nerve    [id number]  . PONV (postoperative nausea and vomiting)   . Snoring     Past Surgical History:  Procedure Laterality Date  . ARTHROSCOPIC REPAIR ACL Left   . BIOPSY N/A 06/12/2014   Procedure: BIOPSY;  Surgeon: Danie Binder, MD;  Location: [facility];  Service: Endoscopy;  Laterality: N/A;  . BIOPSY  09/14/2017   Procedure: BIOPSY;  Surgeon: Danie Binder, MD;  Location: [facility];  Service: Endoscopy;;  colon  . CARPAL TUNNEL RELEASE Right   . CHOLECYSTECTOMY  1990s  . CLUB FOOT RELEASE Left    x3  . COLONOSCOPY WITH PROPOFOL N/A 06/12/2014   Dr. Suzette Battiest colon polyps removed/moderate divertiiculosis/small internal hemorrhoids/moderate sized external hemorrhoids. tubular adenomas. Next surveillance in Oct 2018.   Marland Kitchen COLONOSCOPY WITH PROPOFOL N/A 09/14/2017   Dr. Oneida Alar: simple adenomas, moderate diverticulosis in rectosigmoid colon, sigmoid colon, and descending colon. External and internal hemorrhoids, normal colon biopsies. simple adenomas. Needs surveillance in 3 years with Propofol, colowrap  . ESOPHAGOGASTRODUODENOSCOPY (EGD) WITH PROPOFOL N/A 06/12/2014   Dr. 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 benign gastric and duodenal biopsies  . POLYPECTOMY N/A 06/12/2014   Procedure: POLYPECTOMY;  Surgeon: Danie Binder, MD;  Location: [facility];  Service: Endoscopy;  Laterality: N/A;  .  POLYPECTOMY  09/14/2017   Procedure: POLYPECTOMY;  Surgeon: Danie Binder, MD;  Location: [facility];  Service: Endoscopy;;  colon  . SAVORY DILATION N/A 06/12/2014   Procedure: SAVORY DILATION;  Surgeon: Danie Binder, MD;  Location: [facility];  Service: Endoscopy;  Laterality: N/A;  12.8-17    Current Outpatient Medications  Medication Sig Dispense Refill  . albuterol (PROVENTIL HFA;VENTOLIN HFA) 108 (90 Base)  MCG/ACT inhaler Inhale 2 puffs into the lungs every 6 (six) hours as needed for wheezing or shortness of breath.    Marland Kitchen aluminum chloride (DRYSOL) 20 % external solution Apply topically as needed.     . Artificial Tear Solution (SOOTHE XP) SOLN Apply 1 drop to eye daily as needed (dry eyes).    . Cholecalciferol (VITAMIN D3) 5000 units CAPS 10,000 Units daily.     Marland Kitchen dicyclomine (BENTYL) 10 MG capsule TAKE 1 CAPSULE BY MOUTH FOUR TIMES DAILY BEFORE MEALS AND AT BEDTIME FOR LOOSE STOOL AND ABDOMINAL CRAMPING. 120 capsule 3  . Dulaglutide (TRULICITY) 1.5 0000000 SOPN Inject 1.5 mg into the skin once a week. 8 mL 3  . DULoxetine (CYMBALTA) 60 MG capsule Take 60 mg daily by mouth.     . fluticasone (FLONASE) 50 MCG/ACT nasal spray Place 2 sprays into both nostrils daily as needed for allergies or rhinitis.    . furosemide (LASIX) 20 MG tablet Take 20 mg by mouth daily as needed for fluid or edema.     . gabapentin (NEURONTIN) 300 MG capsule Take 600 mg by mouth 3 (three) times daily.    . Insulin Degludec (TRESIBA) 100 UNIT/ML SOLN Inject 40 Units into the skin at bedtime.    . Insulin Pen Needle (B-D ULTRAFINE III SHORT PEN) 31G X 8 MM MISC 1 each by Does not apply route as directed. 100 each 3  . Ketorolac Tromethamine (TORADOL IJ) Inject as directed as needed. Gets at dr office    . metFORMIN (GLUCOPHAGE-XR) 500 MG 24 hr tablet TAKE 1 TABLET BY MOUTH TWICE DAILY AFTER A MEAL. 180 tablet 0  . NYAMYC powder     . pantoprazole (PROTONIX) 40 MG tablet Take 1 tablet (40 mg total) by mouth daily before breakfast. 90 tablet 3  . rosuvastatin (CRESTOR) 10 MG tablet Take 1 tablet (10 mg total) by mouth daily. 90 tablet 1  . traMADol (ULTRAM) 50 MG tablet Take 50-100 mg by mouth every 6 (six) hours as needed for moderate pain (depends on pain level if takes  1-2 tablets).     . zolpidem (AMBIEN) 10 MG tablet Take 5-10 mg by mouth at bedtime as needed for sleep (depends on insomnia if takes 0.5-1 tablet).     Marland Kitchen  lidocaine (LINDAMANTLE) 3 % CREA cream Apply 1 application topically as needed (joint pain).   4   No current facility-administered medications for this visit.    Allergies as of 11/02/2019  . (No Known Allergies)    Family History  Problem Relation Age of Onset  . Heart failure Father        Died, 78  . Hypertension Mother        Living, 101  . Breast cancer Mother   . Kidney cancer Mother   . Hypercholesterolemia Son   . Breast cancer Sister   . Pancreatic cancer Brother        passed age 1  . Hypertension Sister   . Colon cancer Neg Hx     Social History  Socioeconomic History  . Marital status: Married    Spouse name: Marcello Moores  . Number of children: 2  . Years of education: Not on file  . Highest education level: Associate degree: occupational, Hotel manager, or vocational program  Occupational History  . Occupation: disabled  Tobacco Use  . Smoking status: Former Smoker    Packs/day: 0.50    Years: 20.00    Pack years: 10.00    Types: Cigarettes    Quit date: 10/18/2011    Years since quitting: 8.0  . Smokeless tobacco: Never Used  . Tobacco comment: quit in 2014  Substance and Sexual Activity  . Alcohol use: No  . Drug use: No  . Sexual activity: Not Currently    Birth control/protection: Post-menopausal  Other Topics Concern  . Not on file  Social History Narrative   She is currently unemployed.  She was terminated on 06/15/2013.  She was previously a Loss adjuster, chartered, worked at [employer], and worked in Charity fundraiser for 25 years.   She lives with husband.  They have two children.   Social Determinants of Health   Financial Resource Strain:   . Difficulty of Paying Living Expenses:   Food Insecurity:   . Worried About Charity fundraiser in the Last Year:   . Arboriculturist in the Last Year:   Transportation Needs:   . Film/video editor (Medical):   Marland Kitchen Lack of Transportation (Non-Medical):   Physical Activity:   . Days of Exercise per Week:   .  Minutes of Exercise per Session:   Stress:   . Feeling of Stress :   Social Connections:   . Frequency of Communication with Friends and Family:   . Frequency of Social Gatherings with Friends and Family:   . Attends Religious Services:   . Active Member of Clubs or Organizations:   . Attends Archivist Meetings:   Marland Kitchen Marital Status:     Review of Systems: Gen: Denies fever, chills, anorexia. Denies fatigue, weakness, weight loss.  CV: Denies chest pain, palpitations, syncope, peripheral edema, and claudication. Resp: Denies dyspnea at rest, cough, wheezing, coughing up blood, and pleurisy. GI: see HPI Derm: Denies rash, itching, dry skin Psych: Denies depression, anxiety, memory loss, confusion. No homicidal or suicidal ideation.  Heme: Denies bruising, bleeding, and enlarged lymph nodes.  Physical Exam: BP 131/80   Pulse 94   Temp (!) 96.8 F (36 C) (Temporal)   Ht 5\' 2"  (1.575 m)   Wt 207 lb 3.2 oz (94 kg)   BMI 37.90 kg/m  General:   Alert and oriented. No distress noted. Pleasant and cooperative.  Head:  Normocephalic and atraumatic. Eyes:  Conjuctiva clear without scleral icterus. Mouth:  Mask in place  Abdomen:  +BS, soft, non-tender and non-distended. No rebound or guarding. No HSM or masses noted. Msk:  Symmetrical without gross deformities. Normal posture. Extremities:  Without edema. Neurologic:  Alert and  oriented x4 Psych:  Alert and cooperative. Normal mood and affect.  ASSESSMENT: HUNTER ABDULMALIK is a 59 y.o. female presenting today with history of chronic diarrhea, GERD, presenting in follow-up.  Chronic diarrhea: multifactorial in setting of post-cholecystectomy state, diabetes, IBS. Bentyl historically with improvement, now with mild worsening but no alarm symptoms or signs. Will trial course of Xifaxan.   GERD: some exacerbation with 2 severe nocturnal episodes. Continue Protonix daily, add Pepcid as needed, bloating handout provided.  Discussed diet/behavior modifications.    PLAN:   Xifaxan 550 mg  po TID for 14 days  Pepcid in evenings prn, continue Protonix once daily  May take 1-2 capsules of Bentyl up to 4 timesi per day  Return in 4-6 months or sooner if needed   Annitta Needs, PhD, ANP-BC [facility]

## 2019-11-02 ENCOUNTER — Other Ambulatory Visit: Payer: Self-pay

## 2019-11-02 ENCOUNTER — Encounter: Payer: Self-pay | Admitting: Gastroenterology

## 2019-11-02 ENCOUNTER — Encounter: Payer: Self-pay | Admitting: *Deleted

## 2019-11-02 ENCOUNTER — Ambulatory Visit: Payer: Medicare Other | Admitting: Gastroenterology

## 2019-11-02 VITALS — BP 131/80 | HR 94 | Temp 96.8°F | Ht 62.0 in | Wt 207.2 lb

## 2019-11-02 DIAGNOSIS — R1312 Dysphagia, oropharyngeal phase: Secondary | ICD-10-CM | POA: Diagnosis not present

## 2019-11-02 DIAGNOSIS — R140 Abdominal distension (gaseous): Secondary | ICD-10-CM | POA: Diagnosis not present

## 2019-11-02 DIAGNOSIS — R197 Diarrhea, unspecified: Secondary | ICD-10-CM

## 2019-11-02 DIAGNOSIS — K219 Gastro-esophageal reflux disease without esophagitis: Secondary | ICD-10-CM

## 2019-11-02 MED ORDER — RIFAXIMIN 550 MG PO TABS: 550.0000 mg | ORAL_TABLET | Freq: Three times a day (TID) | ORAL | 0 refills | Status: AC

## 2019-11-02 MED ORDER — FAMOTIDINE 20 MG PO TABS: 20.0000 mg | ORAL_TABLET | Freq: Every day | ORAL | 3 refills | Status: DC

## 2019-11-02 NOTE — Patient Instructions (Signed)
You can take Bentyl 1-2 capsules up to 4 times per day (no more than 8 capsules in 24 hours). Watch for dry mouth, dizziness, and constipation.  I sent in a one-time course of Xifaxan to take three times a day for 14 days. This is to treat IBS. We may need to talk to insurance company for coverage, but we will see.   You can take Pepcid complete over the counter at night as needed.  Please review the gas/bloating handout. If you still have discomfort despite the above measures, please call!  We will see you in 4-6 months!  I enjoyed seeing you again today! As you know, I value our relationship and want to provide genuine, compassionate, and quality care. I welcome your feedback. If you receive a survey regarding your visit,  I greatly appreciate you taking time to fill this out. See you next time!  Annitta Needs, PhD, ANP-BC [facility]

## 2019-11-03 ENCOUNTER — Other Ambulatory Visit: Payer: Self-pay | Admitting: "Endocrinology

## 2019-11-08 ENCOUNTER — Other Ambulatory Visit: Payer: Self-pay

## 2019-11-08 ENCOUNTER — Ambulatory Visit (HOSPITAL_COMMUNITY)
Admission: RE | Admit: 2019-11-08 | Discharge: 2019-11-08 | Disposition: A | Discharge status: Home / Self Care | Payer: Medicare Other | Source: Ambulatory Visit | Attending: Gastroenterology | Admitting: Gastroenterology

## 2019-11-08 DIAGNOSIS — R1312 Dysphagia, oropharyngeal phase: Secondary | ICD-10-CM

## 2019-11-15 ENCOUNTER — Telehealth: Payer: Self-pay

## 2019-11-15 ENCOUNTER — Telehealth: Payer: Self-pay | Admitting: Gastroenterology

## 2019-11-15 NOTE — Telephone Encounter (Signed)
PA for Xifaxan 550 mg was submitted through [email or web address]

## 2019-11-15 NOTE — Telephone Encounter (Signed)
Pt called to let AM know even with the PA approval her cost would be $600 for Xifixan and she can not afford to get that. Please advise. [phone]

## 2019-11-15 NOTE — Telephone Encounter (Signed)
PA was approved through [email or web address], pt is aware of approval.

## 2019-11-15 NOTE — Telephone Encounter (Signed)
Noted. Routing to AB, PA was approved for this pt and the cost is still $600.00. please advise.

## 2019-11-16 NOTE — Telephone Encounter (Signed)
Can we check on Xifaxan assistance with this?

## 2019-11-30 ENCOUNTER — Other Ambulatory Visit: Payer: Self-pay | Admitting: "Endocrinology

## 2019-12-12 NOTE — Telephone Encounter (Signed)
PA was submitted. Spoke with the Xifaxan Rep. She will contact you soon to discuss samples. PA is about the only thing that can be done to help with medication.

## 2019-12-13 ENCOUNTER — Other Ambulatory Visit: Payer: Self-pay | Admitting: "Endocrinology

## 2019-12-13 NOTE — Telephone Encounter (Signed)
PA for Xifaxan has been approved. Pt is aware of approval. Pt will call her pharmacy to see if the approval reduces the cost of the medication.

## 2019-12-14 ENCOUNTER — Telehealth: Payer: Self-pay | Admitting: "Endocrinology

## 2019-12-14 NOTE — Telephone Encounter (Signed)
Pt requesting refill on TRESIBA FLEXTOUCH 100 UNIT/ML FlexTouch Pen. She said the pharmacy has sent it 2x, I have not seen anything. Wade

## 2019-12-15 MED ORDER — TRESIBA FLEXTOUCH 100 UNIT/ML ~~LOC~~ SOPN: PEN_INJECTOR | SUBCUTANEOUS | 0 refills | Status: DC

## 2019-12-15 NOTE — Telephone Encounter (Signed)
Rx refill sent to [facility]. 

## 2019-12-22 DIAGNOSIS — G894 Chronic pain syndrome: Secondary | ICD-10-CM | POA: Diagnosis not present

## 2019-12-22 DIAGNOSIS — E1165 Type 2 diabetes mellitus with hyperglycemia: Secondary | ICD-10-CM | POA: Diagnosis not present

## 2019-12-22 DIAGNOSIS — I10 Essential (primary) hypertension: Secondary | ICD-10-CM | POA: Diagnosis not present

## 2019-12-26 ENCOUNTER — Telehealth: Payer: Self-pay | Admitting: *Deleted

## 2019-12-26 NOTE — Telephone Encounter (Signed)
Left message for Crystal. Waiting on a return call.

## 2019-12-26 NOTE — Telephone Encounter (Signed)
Crystal from [facility] called and informed us that Xifaxin 550 mg is too expensive for pt.  Wants to know if we can prescribe Rifampin P: [phone] Ext: [phone]

## 2019-12-27 NOTE — Telephone Encounter (Signed)
Crystal returned call. Xifaxan 550 mg isn't covered under pts plan. Rifampin 150 mg or 300 mg capsules are covered for pt. Crystal is asking that we change medication so it can be covered for pt.

## 2020-01-01 NOTE — Telephone Encounter (Signed)
We have enough samples for Xifaxan 550 mg bid x 2 weeks. Pt is aware and will pick samples up tomorrow.

## 2020-01-01 NOTE — Telephone Encounter (Signed)
Rifampin is a different drug (used for treatment of TB amongst other things). Not appropriate use when we are treating IBS-D. Rifampin is not used for that. Unsure why insurance would offer this as an alternative.  It looks like a PA was approved on phone note 3/24 but still expensive. Do we have samples to provide?

## 2020-01-02 NOTE — Telephone Encounter (Signed)
Dosage has been corrected to Xifaxan 550mg  tid x 2 weeks # 42. Samples are ready for pickup.

## 2020-01-02 NOTE — Telephone Encounter (Signed)
Erica Price, it will be TID for 2 weeks, so she would need 42 tablets.

## 2020-01-10 ENCOUNTER — Other Ambulatory Visit: Payer: Self-pay | Admitting: "Endocrinology

## 2020-01-10 ENCOUNTER — Other Ambulatory Visit: Payer: Self-pay | Admitting: Gastroenterology

## 2020-01-16 ENCOUNTER — Ambulatory Visit (INDEPENDENT_AMBULATORY_CARE_PROVIDER_SITE_OTHER): Payer: Medicare Other | Admitting: "Endocrinology

## 2020-01-16 ENCOUNTER — Other Ambulatory Visit: Payer: Self-pay

## 2020-01-16 ENCOUNTER — Encounter: Payer: Self-pay | Admitting: "Endocrinology

## 2020-01-16 VITALS — BP 124/77 | HR 93 | Ht 62.0 in | Wt 210.6 lb

## 2020-01-16 DIAGNOSIS — E559 Vitamin D deficiency, unspecified: Secondary | ICD-10-CM | POA: Diagnosis not present

## 2020-01-16 DIAGNOSIS — G894 Chronic pain syndrome: Secondary | ICD-10-CM | POA: Diagnosis not present

## 2020-01-16 DIAGNOSIS — I10 Essential (primary) hypertension: Secondary | ICD-10-CM | POA: Diagnosis not present

## 2020-01-16 DIAGNOSIS — E7849 Other hyperlipidemia: Secondary | ICD-10-CM | POA: Diagnosis not present

## 2020-01-16 DIAGNOSIS — E782 Mixed hyperlipidemia: Secondary | ICD-10-CM

## 2020-01-16 DIAGNOSIS — R7309 Other abnormal glucose: Secondary | ICD-10-CM | POA: Diagnosis not present

## 2020-01-16 DIAGNOSIS — M797 Fibromyalgia: Secondary | ICD-10-CM | POA: Diagnosis not present

## 2020-01-16 DIAGNOSIS — E1165 Type 2 diabetes mellitus with hyperglycemia: Secondary | ICD-10-CM

## 2020-01-16 LAB — POCT GLYCOSYLATED HEMOGLOBIN (HGB A1C): Hemoglobin A1C: 6.9 % — AB (ref 4.0–5.6)

## 2020-01-16 NOTE — Progress Notes (Signed)
01/16/2020, 12:59 PM  Endocrinology follow-up note   Subjective:    Patient ID: Erica Price, female    DOB: [birth year].  Erica Price is being seen  in follow-up for management of currently uncontrolled symptomatic diabetes requested by  [name], MD.   Past Medical History:  Diagnosis Date  . Apnea    Poss OSA  . Arthralgia   . Arthritis   . Depression   . Diabetes mellitus without complication ([place])   . Fatigue   . Fibromyalgia   . GERD (gastroesophageal reflux disease)   . IBS (irritable bowel syndrome)   . Myalgia   . Pinched nerve    lumbar-l5-[id number]  . PONV (postoperative nausea and vomiting)   . Snoring     Past Surgical History:  Procedure Laterality Date  . ARTHROSCOPIC REPAIR ACL Left   . BIOPSY N/A 06/12/2014   Procedure: BIOPSY;  Surgeon: Danie Binder, MD;  Location: [facility];  Service: Endoscopy;  Laterality: N/A;  . BIOPSY  09/14/2017   Procedure: BIOPSY;  Surgeon: Danie Binder, MD;  Location: [facility];  Service: Endoscopy;;  colon  . CARPAL TUNNEL RELEASE Right   . CHOLECYSTECTOMY  1990s  . CLUB FOOT RELEASE Left    x3  . COLONOSCOPY WITH PROPOFOL N/A 06/12/2014   Dr. Suzette Battiest colon polyps removed/moderate divertiiculosis/small internal hemorrhoids/moderate sized external hemorrhoids. tubular adenomas. Next surveillance in Oct 2018.   Marland Kitchen COLONOSCOPY WITH PROPOFOL N/A 09/14/2017   Dr. Oneida Alar: simple adenomas, moderate diverticulosis in rectosigmoid colon, sigmoid colon, and descending colon. External and internal hemorrhoids, normal colon biopsies. simple adenomas. Needs surveillance in 3 years with Propofol, colowrap  . ESOPHAGOGASTRODUODENOSCOPY (EGD) WITH PROPOFOL N/A 06/12/2014   Dr. 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 benign gastric and duodenal biopsies  . POLYPECTOMY N/A 06/12/2014   Procedure: POLYPECTOMY;   Surgeon: Danie Binder, MD;  Location: [facility];  Service: Endoscopy;  Laterality: N/A;  . POLYPECTOMY  09/14/2017   Procedure: POLYPECTOMY;  Surgeon: Danie Binder, MD;  Location: [facility];  Service: Endoscopy;;  colon  . SAVORY DILATION N/A 06/12/2014   Procedure: SAVORY DILATION;  Surgeon: Danie Binder, MD;  Location: [facility];  Service: Endoscopy;  Laterality: N/A;  12.8-17    Social History   Socioeconomic History  . Marital status: Married    Spouse name: Marcello Moores  . Number of children: 2  . Years of education: Not on file  . Highest education level: Associate degree: occupational, Hotel manager, or vocational program  Occupational History  . Occupation: disabled  Tobacco Use  . Smoking status: Former Smoker    Packs/day: 0.50    Years: 20.00    Pack years: 10.00    Types: Cigarettes    Quit date: 10/18/2011    Years since quitting: 8.2  . Smokeless tobacco: Never Used  . Tobacco comment: quit in 2014  Substance and Sexual Activity  . Alcohol use: No  . Drug use: No  . Sexual activity: Not Currently    Birth control/protection: Post-menopausal  Other Topics Concern  . Not on file  Social History Narrative   She is currently unemployed.  She was terminated on 06/15/2013.  She was previously a Loss adjuster, chartered, worked at [employer], and worked in Charity fundraiser for 25 years.   She lives with husband.  They have two children.   Social Determinants of Health   Financial Resource Strain:   . Difficulty of Paying Living Expenses:   Food Insecurity:   . Worried About Charity fundraiser in the Last Year:   . Arboriculturist in the Last Year:   Transportation Needs:   . Film/video editor (Medical):   Marland Kitchen Lack of Transportation (Non-Medical):   Physical Activity:   . Days of Exercise per Week:   . Minutes of Exercise per Session:   Stress:   . Feeling of Stress :   Social Connections:   . Frequency of Communication with Friends and Family:   . Frequency of Social  Gatherings with Friends and Family:   . Attends Religious Services:   . Active Member of Clubs or Organizations:   . Attends Archivist Meetings:   Marland Kitchen Marital Status:     Family History  Problem Relation Age of Onset  . Heart failure Father        Died, 78  . Hypertension Mother        Living, 4  . Breast cancer Mother   . Kidney cancer Mother   . Hypercholesterolemia Son   . Breast cancer Sister   . Pancreatic cancer Brother        passed age 90  . Hypertension Sister   . Colon cancer Neg Hx     Outpatient Encounter Medications as of 01/16/2020  Medication Sig  . albuterol (PROVENTIL HFA;VENTOLIN HFA) 108 (90 Base) MCG/ACT inhaler Inhale 2 puffs into the lungs every 6 (six) hours as needed for wheezing or shortness of breath.  Marland Kitchen aluminum chloride (DRYSOL) 20 % external solution Apply topically as needed.   . Artificial Tear Solution (SOOTHE XP) SOLN Apply 1 drop to eye daily as needed (dry eyes).  . Cholecalciferol (VITAMIN D3) 5000 units CAPS 10,000 Units daily.   Marland Kitchen dicyclomine (BENTYL) 10 MG capsule TAKE 1 CAPSULE BY MOUTH FOUR TIMES DAILY BEFORE MEALS AND AT BEDTIME FOR LOOSE STOOL AND ABDOMINAL CRAMPING. (Patient taking differently: 10 mg. )  . Dulaglutide (TRULICITY) 1.5 0000000 SOPN Inject 1.5 mg into the skin once a week.  . DULoxetine (CYMBALTA) 60 MG capsule Take 60 mg daily by mouth.   . fluticasone (FLONASE) 50 MCG/ACT nasal spray Place 2 sprays into both nostrils daily as needed for allergies or rhinitis.  . furosemide (LASIX) 20 MG tablet Take 20 mg by mouth daily as needed for fluid or edema.   . gabapentin (NEURONTIN) 300 MG capsule Take 600 mg by mouth 3 (three) times daily.  . insulin degludec (TRESIBA FLEXTOUCH) 100 UNIT/ML FlexTouch Pen INJECT 40 UNITS INTO THE SKIN AT BEDTIME.  Marland Kitchen Ketorolac Tromethamine (TORADOL IJ) Inject as directed as needed. Gets at dr office  . metFORMIN (GLUCOPHAGE-XR) 500 MG 24 hr tablet TAKE 1 TABLET BY MOUTH TWICE DAILY  AFTER A MEAL.  Marland Kitchen NYAMYC powder   . pantoprazole (PROTONIX) 40 MG tablet TAKE ONE TABLET BY MOUTH DAILY 30 MINUTES PRIOR TO BREAKFAST.  . rosuvastatin (CRESTOR) 10 MG tablet TAKE ONE TABLET BY MOUTH ONCE DAILY.  . SURE COMFORT PEN NEEDLES 31G X 8 MM MISC USE DAILY WITH TRESIBA.  Marland Kitchen traMADol (ULTRAM) 50 MG tablet Take 50-100 mg  by mouth every 6 (six) hours as needed for moderate pain (depends on pain level if takes  1-2 tablets).   . zolpidem (AMBIEN) 10 MG tablet Take 5-10 mg by mouth at bedtime as needed for sleep (depends on insomnia if takes 0.5-1 tablet).    No facility-administered encounter medications on file as of 01/16/2020.    ALLERGIES: No Known Allergies  VACCINATION STATUS:  There is no immunization history on file for this patient.  Diabetes She presents for her follow-up diabetic visit. She has type 2 diabetes mellitus. Onset time: She was diagnosed at approximate age of 43 years. Her disease course has been improving. There are no hypoglycemic associated symptoms. Pertinent negatives for hypoglycemia include no confusion, headaches, pallor or seizures. Associated symptoms include fatigue. Pertinent negatives for diabetes include no chest pain, no polydipsia, no polyphagia and no polyuria. There are no hypoglycemic complications. Symptoms are improving. There are no diabetic complications. Risk factors for coronary artery disease include diabetes mellitus, obesity, sedentary lifestyle, post-menopausal and tobacco exposure. Current diabetic treatments: She is on Invokana 300 mg p.o. daily, Trulicity [id number] mg subcutaneously weekly. Her weight is increasing steadily. She is following a generally unhealthy diet. When asked about meal planning, she reported none. She has not had a previous visit with a dietitian. She rarely participates in exercise. Her home blood glucose trend is decreasing steadily. Her breakfast blood glucose range is generally 130-140 mg/dl. Her bedtime blood glucose  range is generally 140-180 mg/dl. Her overall blood glucose range is 130-140 mg/dl. (She presents with controlled glycemic profile.  Her point-of-care A1c 6.9%, improving.) An ACE inhibitor/angiotensin II receptor blocker is not being taken. She does not see a podiatrist.Eye exam is current.    Physical Exam- Limited  Constitutional:  Body mass index is 38.52 kg/m. , not in acute distress, normal state of mind Eyes:  EOMI, no exophthalmos Neck: Supple Thyroid: No gross goiter Respiratory: Adequate breathing efforts Musculoskeletal: no gross deformities, strength intact in all four extremities, no gross restriction of joint movements Skin:  no rashes, no hyperemia Neurological: no tremor with outstretched hands,     Objective:    BP 124/77   Pulse 93   Ht 5\' 2"  (1.575 m)   Wt 210 lb 9.6 oz (95.5 kg)   BMI 38.52 kg/m   Wt Readings from Last 3 Encounters:  01/16/20 210 lb 9.6 oz (95.5 kg)  11/02/19 207 lb 3.2 oz (94 kg)  09/15/19 205 lb (93 kg)     Physical Exam Constitutional:      Appearance: She is well-developed.  HENT:     Head: Normocephalic and atraumatic.  Neck:     Thyroid: No thyromegaly.     Trachea: No tracheal deviation.  Pulmonary:     Effort: Pulmonary effort is normal.  Abdominal:     Tenderness: There is no abdominal tenderness. There is no guarding.  Musculoskeletal:        General: Normal range of motion.     Cervical back: Normal range of motion and neck supple.  Skin:    General: Skin is warm and dry.     Coloration: Skin is not pale.     Findings: No erythema or rash.  Neurological:     Mental Status: She is alert and oriented to person, place, and time.     Cranial Nerves: No cranial nerve deficit.     Coordination: Coordination normal.     Deep Tendon Reflexes: Reflexes are normal and symmetric.  Psychiatric:  Mood and Affect: Mood normal.        Judgment: Judgment normal.      CMP ( most recent) CMP     Component Value  Date/Time   NA 142 06/06/2019 0951   K 4.9 06/06/2019 0951   CL 104 06/06/2019 0951   CO2 24 06/06/2019 0951   GLUCOSE 147 (H) 06/06/2019 0951   GLUCOSE 185 (H) 07/27/2017 0916   BUN 21 06/06/2019 0951   CREATININE 0.84 06/06/2019 0951   CALCIUM 9.0 06/06/2019 0951   PROT 6.4 06/06/2019 0951   ALBUMIN 4.0 06/06/2019 0951   AST 10 06/06/2019 0951   ALT 11 06/06/2019 0951   ALKPHOS 75 06/06/2019 0951   BILITOT <0.2 06/06/2019 0951   GFRNONAA 78 06/06/2019 0951   GFRAA 90 06/06/2019 0951    Lipid Panel     Component Value Date/Time   CHOL 177 02/28/2019 1048   TRIG 194 (H) 02/28/2019 1048   HDL 41 02/28/2019 1048   LDLCALC 97 02/28/2019 1048   LABVLDL 39 02/28/2019 1048      Assessment & Plan:   1. Uncontrolled type 2 diabetes mellitus with hyperglycemia ([place])  - DERRICK EPPERS has currently uncontrolled symptomatic type 2 DM since  58 years of age.  She presents with controlled glycemic profile.  Her point-of-care A1c 6.9%, improving. Her logs are reviewed with her. -her diabetes is complicated by obesity/sedentary life and she remains at a high risk for more acute and chronic complications which include CAD, CVA, CKD, retinopathy, and neuropathy. These are all discussed in detail with her.  - I have counseled her on diet management and weight loss, by adopting a carbohydrate restricted/protein rich diet.  - she  admits there is a room for improvement in her diet and drink choices. -  Suggestion is made for her to avoid simple carbohydrates  from her diet including Cakes, Sweet Desserts / Pastries, Ice Cream, Soda (diet and regular), Sweet Tea, Candies, Chips, Cookies, Sweet Pastries,  Store Bought Juices, Alcohol in Excess of  1-2 drinks a day, Artificial Sweeteners, Coffee Creamer, and "Sugar-free" Products. This will help patient to have stable blood glucose profile and potentially avoid unintended weight gain.  - I encouraged her to switch to  unprocessed or  minimally processed complex starch and increased protein intake (animal or plant source), fruits, and vegetables.  - she is advised to stick to a routine mealtimes to eat 3 meals  a day and avoid unnecessary snacks ( to snack only to correct hypoglycemia).   - she will be scheduled with Jearld Fenton, RDN, CDE for individualized diabetes education.  - I have approached her with the following individualized plan to manage diabetes and patient agrees:   -Based on her presenting glycemic profile, she will not need prandial insulin for now.    -She is advised to continue Tresiba 40 units nightly,  associated with monitoring of blood glucose 2 times a day daily before breakfast and at bedtime.   - she is encouraged to call clinic for blood glucose levels less than 70 or above 300 mg /dl. -She has been receiving assistance from the manufacturer of Trulicity, she is advised to continue her Trulicity 1.5 mg subcutaneously weekly,  therapeutically suitable for patient .    -She reports better tolerance to metformin.  She is advised to continue Metformin 500 mg extended release twice daily after breakfast and supper.     - Patient specific target  A1c;  LDL, HDL, Triglycerides,  were discussed in detail.  2) Blood Pressure /Hypertension: -Her blood pressure is controlled to target.  she is not on any antihypertensive medications.    3) Lipids/Hyperlipidemia:   Review of her recent lipid panel showed uncontrolled  LDL at 97, was 137 before.  She is advised to continue Crestor 10 mg p.o. nightly.    Side effects and precautions discussed with    4)  Weight/Diet: Her BMI 38.5 -   clearly complicating her diabetes care.  She is a candidate for modest weight loss.  I discussed with her the fact that loss of 5 - 10% of her  current body weight will have the most impact on her diabetes management.  CDE Consult will be initiated . Exercise, and detailed carbohydrates information provided  -  detailed on  discharge instructions.  5) Chronic Care/Health Maintenance:  -she   is encouraged to initiate and continue to follow up with Ophthalmology, Dentist,  Podiatrist at least yearly or according to recommendations, and advised to   stay away from smoking. I have recommended yearly flu vaccine and pneumonia vaccine at least every 5 years; moderate intensity exercise for up to 150 minutes weekly; and  sleep for at least 7 hours a day.  - she is  advised to maintain close follow up with [name], MD for primary care needs, as well as her other providers for optimal and coordinated care.   - Time spent on this patient care encounter:  35 min, of which > 50% was spent in  counseling and the rest reviewing her blood glucose logs , discussing her hypoglycemia and hyperglycemia episodes, reviewing her current and  previous labs / studies  ( including abstraction from other facilities) and medications  doses and developing a  long term treatment plan and documenting her care.   Please refer to Patient Instructions for Blood Glucose Monitoring and Insulin/Medications Dosing Guide"  in media tab for additional information. Please  also refer to " Patient Self Inventory" in the Media  tab for reviewed elements of pertinent patient history.  Allena Katz participated in the discussions, expressed understanding, and voiced agreement with the above plans.  All questions were answered to her satisfaction. she is encouraged to contact clinic should she have any questions or concerns prior to her return visit.    Follow up plan: - Return in about 4 months (around 05/18/2020) for F/U with Pre-visit Labs, Meter, Logs, A1c here., NV Office Urine MA, NV A1c in Office.  Glade Lloyd, MD [facility] [address] [place], [place] [address] Phone: [phone]  Fax: [phone]    01/16/2020, 12:59 PM  This note was partially dictated with voice recognition  software. Similar sounding words can be transcribed inadequately or may not  be corrected upon review.

## 2020-01-16 NOTE — Patient Instructions (Signed)

## 2020-01-22 ENCOUNTER — Other Ambulatory Visit: Payer: Self-pay | Admitting: "Endocrinology

## 2020-02-23 ENCOUNTER — Other Ambulatory Visit: Payer: Self-pay | Admitting: "Endocrinology

## 2020-03-04 ENCOUNTER — Other Ambulatory Visit: Payer: Self-pay | Admitting: "Endocrinology

## 2020-03-12 DIAGNOSIS — G894 Chronic pain syndrome: Secondary | ICD-10-CM | POA: Diagnosis not present

## 2020-03-13 IMAGING — MG DIGITAL SCREENING BILATERAL MAMMOGRAM WITH TOMO AND CAD
6 series · 6 of 22 positions shown · non-contrast
Comparison: Previous exam(s).

ACR Breast Density Category a: The breast tissue is almost entirely
fatty.

CLINICAL DATA: Screening.

EXAM:
DIGITAL SCREENING BILATERAL MAMMOGRAM WITH TOMO AND CAD

[L MLO synth-2D]
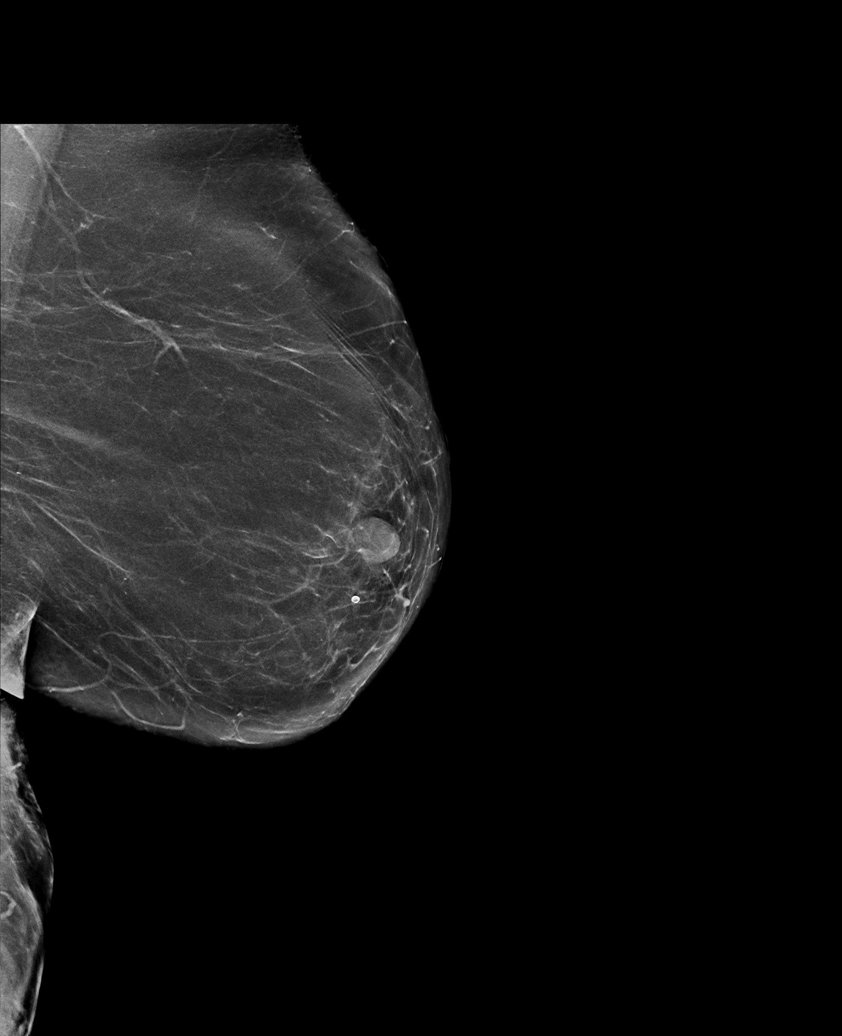

[L CC synth-2D]
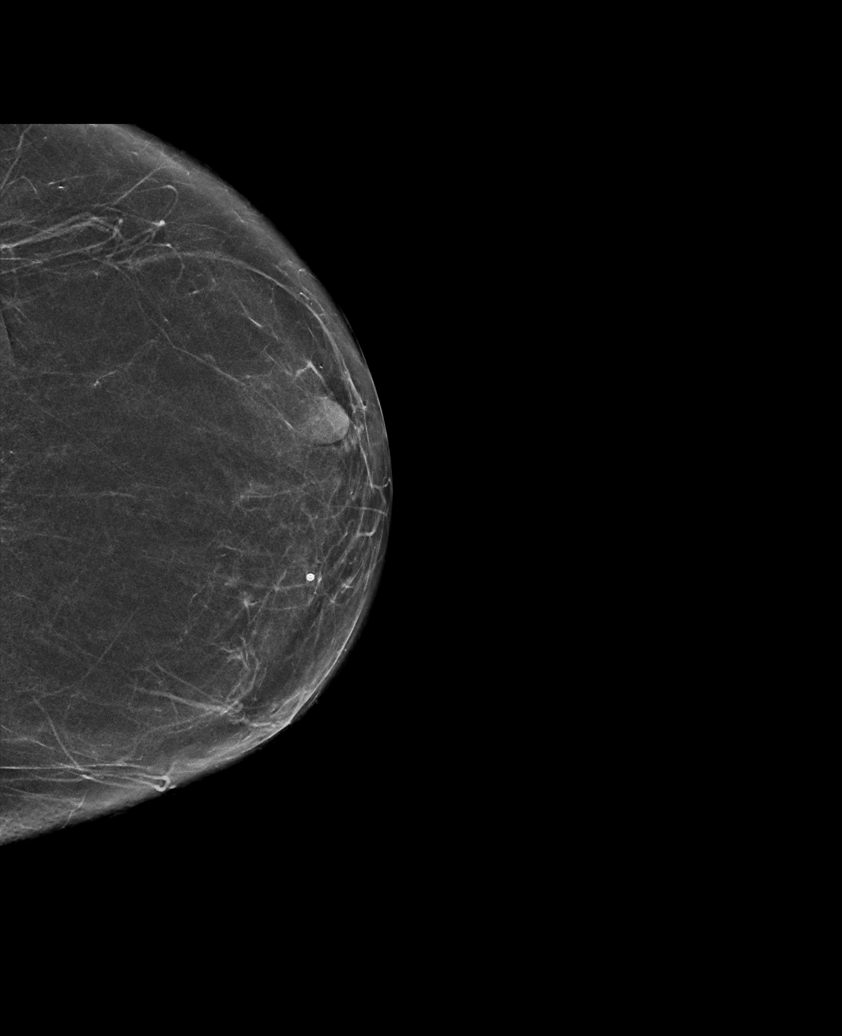

[L MLO tomo · tomo slice 35/68.0]
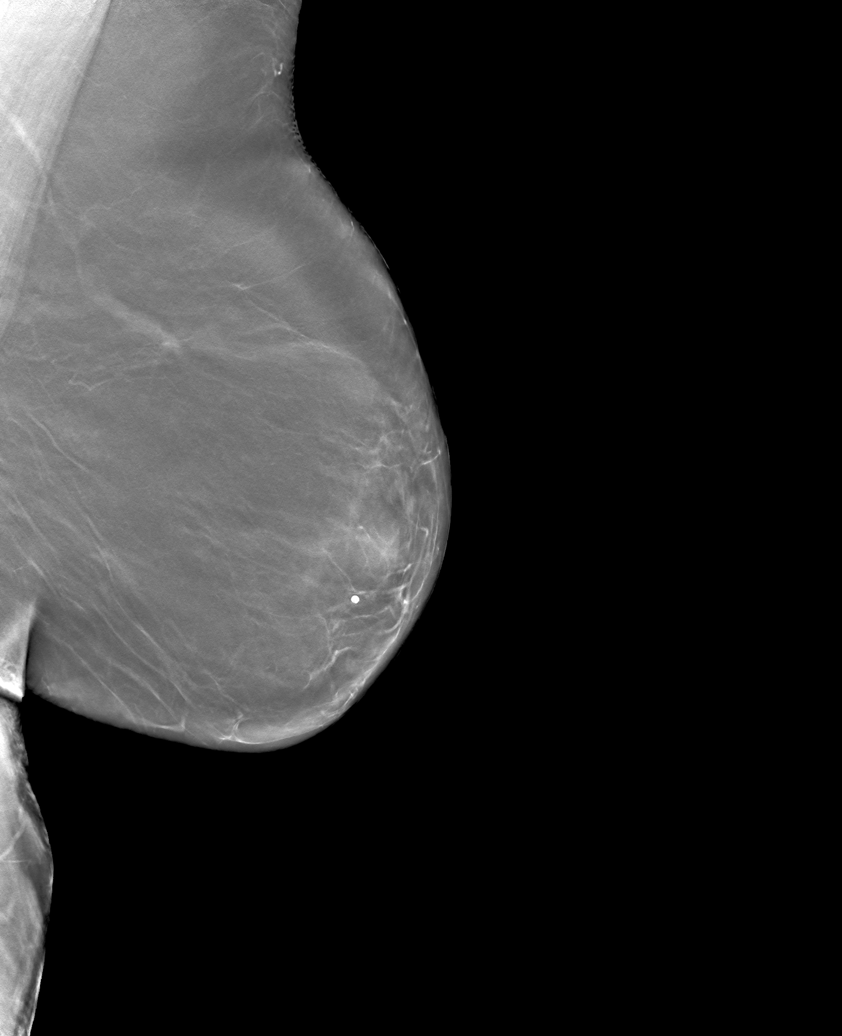

[R CC tomo (1 of 2) · tomo slice 30/59.0]
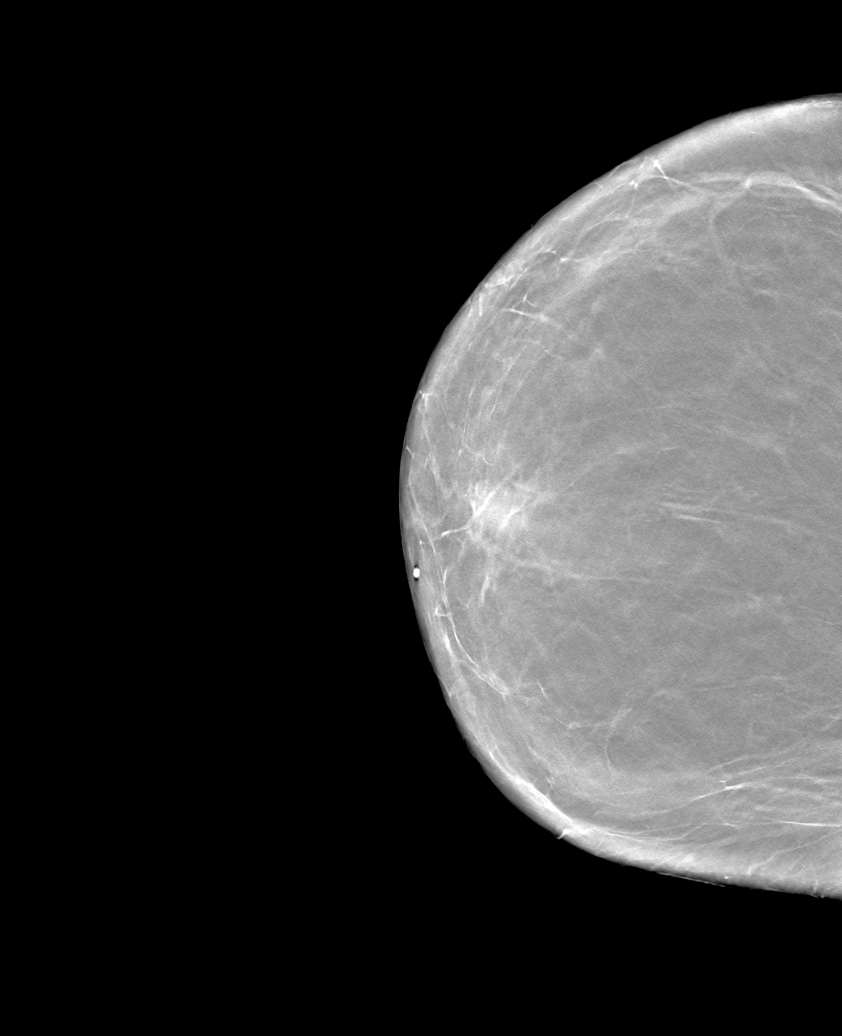

[R MLO tomo · tomo slice 37/73.0]
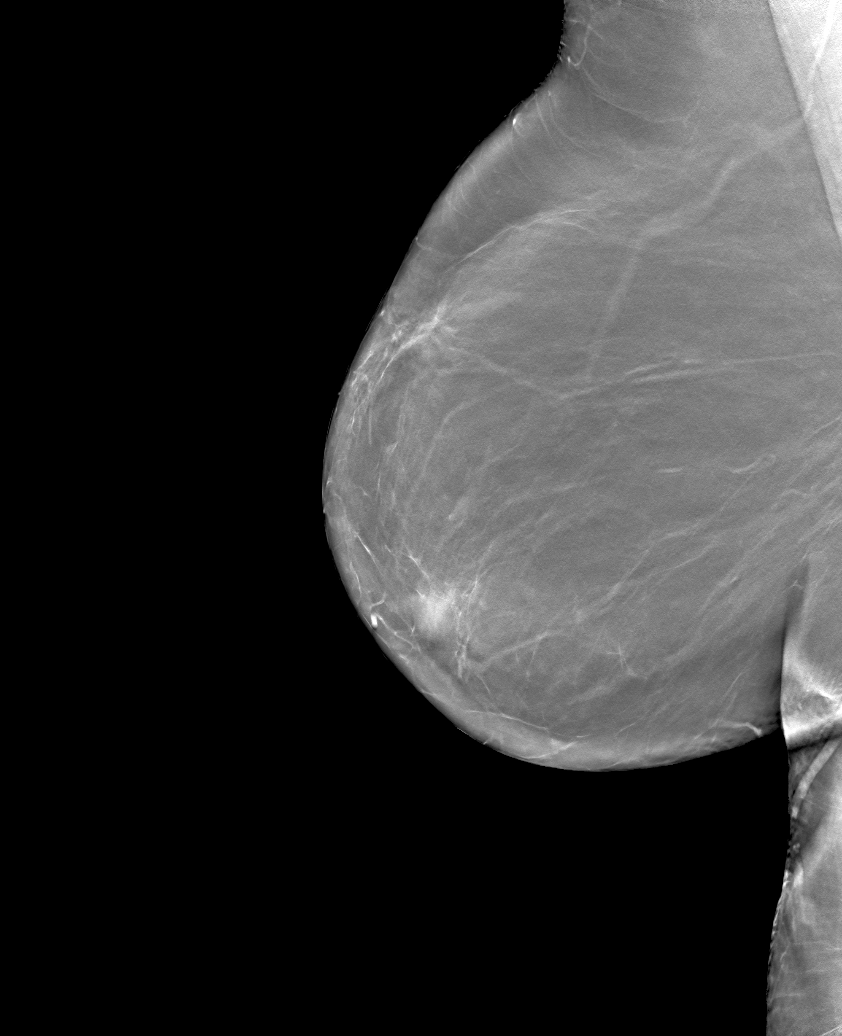

[R CC tomo (2 of 2) · tomo slice 25/49.0]
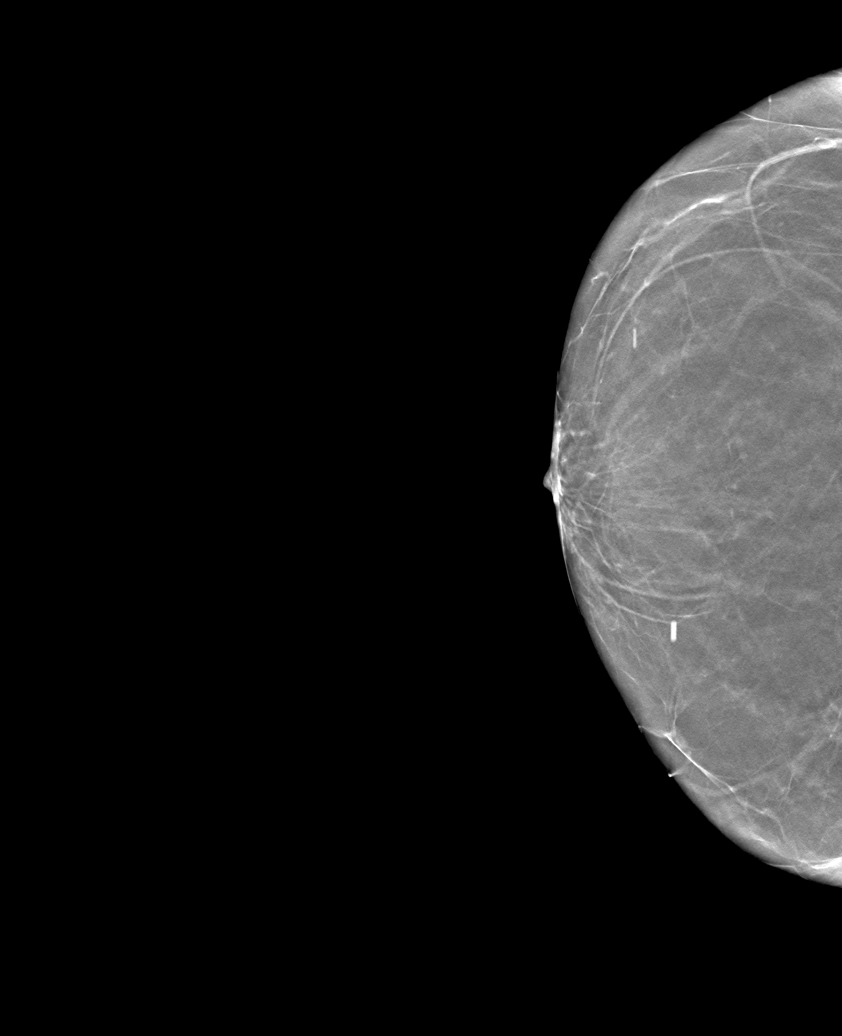

[6 of 22 positions shown; findings below may reference images not displayed]

FINDINGS: There are no findings suspicious for malignancy. Images were
processed with CAD.
IMPRESSION: No mammographic evidence of malignancy. A result letter of this
screening mammogram will be mailed directly to the patient.

RECOMMENDATION:
Screening mammogram in one year. (Code:[id number])

BI-RADS CATEGORY  1: Negative.

## 2020-03-19 ENCOUNTER — Encounter: Payer: Self-pay | Admitting: Gastroenterology

## 2020-03-19 ENCOUNTER — Ambulatory Visit: Payer: Medicare Other | Admitting: Gastroenterology

## 2020-03-19 ENCOUNTER — Other Ambulatory Visit: Payer: Self-pay

## 2020-03-19 VITALS — BP 132/76 | HR 78 | Temp 98.1°F | Ht 61.0 in | Wt 212.4 lb

## 2020-03-19 DIAGNOSIS — R197 Diarrhea, unspecified: Secondary | ICD-10-CM | POA: Diagnosis not present

## 2020-03-19 DIAGNOSIS — K219 Gastro-esophageal reflux disease without esophagitis: Secondary | ICD-10-CM

## 2020-03-19 DIAGNOSIS — Z860100 Personal history of colon polyps, unspecified: Secondary | ICD-10-CM

## 2020-03-19 NOTE — Patient Instructions (Signed)
I am glad you are doing well! Please call if any worsening symptoms.  We will have you see Dr. Abbey Chatters in December/January. You will need a colonoscopy for surveillance in January 2022.  Happy early birthday!  I enjoyed seeing you again today! As you know, I value our relationship and want to provide genuine, compassionate, and quality care. I welcome your feedback. If you receive a survey regarding your visit,  I greatly appreciate you taking time to fill this out. See you next time!  Annitta Needs, PhD, ANP-BC [facility]

## 2020-03-19 NOTE — Progress Notes (Signed)
Referring Provider: [name], MD Primary Care Physician:  [name], MD Primary GI: Dr. Abbey Chatters, previously Dr. Oneida Alar   Chief Complaint  Patient presents with  . Diarrhea    occ    HPI:   Erica Price is a 59 y.o. female presenting today with a history of GERD and diarrhea. Jan 2019 completed colonoscopy with simple adenomas, normal colonic biopsies. Needs surveillance in2022due to history of polyps (with Propofol). 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 Has done well with Bentyl for looser stool, which is multifactorial in setting of metformin, diabetes, post-cholecystectomy state. Course of Xifaxan provided at last visit for IBS-D.    Notes improvement in bloating and diarrhea since Xifaxan course. Stopped metformin recently but has been less than a week she has been off.  Already sees a difference in how she feels and loose stools. Keeping close track of her blood sugars. No dysphagia. Rare abdominal cramping but not significant. Appetite is so-so since coming off metformin. Gets full quicker. No N/V. Protonix once daily. Bentyl TID. No side effects.   Past Medical History:  Diagnosis Date  . Apnea    Poss OSA  . Arthralgia   . Arthritis   . Depression   . Diabetes mellitus without complication ([place])   . Fatigue   . Fibromyalgia   . GERD (gastroesophageal reflux disease)   . IBS (irritable bowel syndrome)   . Myalgia   . Pinched nerve    [id number]  . PONV (postoperative nausea and vomiting)   . Snoring     Past Surgical History:  Procedure Laterality Date  . ARTHROSCOPIC REPAIR ACL Left   . BIOPSY N/A 06/12/2014   Procedure: BIOPSY;  Surgeon: Danie Binder, MD;  Location: [facility];  Service: Endoscopy;  Laterality: N/A;  . BIOPSY  09/14/2017   Procedure: BIOPSY;  Surgeon: Danie Binder, MD;  Location: [facility];  Service: Endoscopy;;  colon  . CARPAL TUNNEL RELEASE Right   . CHOLECYSTECTOMY  1990s  . CLUB FOOT RELEASE Left    x3    . COLONOSCOPY WITH PROPOFOL N/A 06/12/2014   Dr. Suzette Battiest colon polyps removed/moderate divertiiculosis/small internal hemorrhoids/moderate sized external hemorrhoids. tubular adenomas. Next surveillance in Oct 2018.   Marland Kitchen COLONOSCOPY WITH PROPOFOL N/A 09/14/2017   Dr. Oneida Alar: simple adenomas, moderate diverticulosis in rectosigmoid colon, sigmoid colon, and descending colon. External and internal hemorrhoids, normal colon biopsies. simple adenomas. Needs surveillance in 3 years with Propofol, colowrap  . ESOPHAGOGASTRODUODENOSCOPY (EGD) WITH PROPOFOL N/A 06/12/2014   Dr. 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 benign gastric and duodenal biopsies  . POLYPECTOMY N/A 06/12/2014   Procedure: POLYPECTOMY;  Surgeon: Danie Binder, MD;  Location: [facility];  Service: Endoscopy;  Laterality: N/A;  . POLYPECTOMY  09/14/2017   Procedure: POLYPECTOMY;  Surgeon: Danie Binder, MD;  Location: [facility];  Service: Endoscopy;;  colon  . SAVORY DILATION N/A 06/12/2014   Procedure: SAVORY DILATION;  Surgeon: Danie Binder, MD;  Location: [facility];  Service: Endoscopy;  Laterality: N/A;  12.8-17    Current Outpatient Medications  Medication Sig Dispense Refill  . albuterol (PROVENTIL HFA;VENTOLIN HFA) 108 (90 Base) MCG/ACT inhaler Inhale 2 puffs into the lungs every 6 (six) hours as needed for wheezing or shortness of breath.    Marland Kitchen aluminum chloride (DRYSOL) 20 % external solution Apply topically as needed.     . Artificial Tear Solution (SOOTHE XP) SOLN Apply 1 drop to  eye daily as needed (dry eyes).    . Cholecalciferol (VITAMIN D3) 5000 units CAPS 10,000 Units daily.     Marland Kitchen dicyclomine (BENTYL) 10 MG capsule TAKE 1 CAPSULE BY MOUTH FOUR TIMES DAILY BEFORE MEALS AND AT BEDTIME FOR LOOSE STOOL AND ABDOMINAL CRAMPING. (Patient taking differently: 10 mg. ) 120 capsule 2  . Dulaglutide (TRULICITY) 1.5 RX/5.4MG SOPN Inject 1.5 mg into the skin once a week. 8 mL 3  .  DULoxetine (CYMBALTA) 60 MG capsule Take 60 mg daily by mouth.     . fluticasone (FLONASE) 50 MCG/ACT nasal spray Place 2 sprays into both nostrils daily as needed for allergies or rhinitis.    . furosemide (LASIX) 20 MG tablet Take 20 mg by mouth daily as needed for fluid or edema.     . gabapentin (NEURONTIN) 300 MG capsule Take 600 mg by mouth 3 (three) times daily.    Marland Kitchen Ketorolac Tromethamine (TORADOL IJ) Inject as directed as needed. Gets at dr office    . metFORMIN (GLUCOPHAGE-XR) 500 MG 24 hr tablet TAKE 1 TABLET BY MOUTH TWICE DAILY AFTER A MEAL. 180 tablet 0  . NYAMYC powder     . pantoprazole (PROTONIX) 40 MG tablet TAKE ONE TABLET BY MOUTH DAILY 30 MINUTES PRIOR TO BREAKFAST. 90 tablet 2  . rosuvastatin (CRESTOR) 10 MG tablet TAKE ONE TABLET BY MOUTH ONCE DAILY. 90 tablet 0  . SURE COMFORT PEN NEEDLES 31G X 8 MM MISC USE DAILY WITH TRESIBA. 100 each 1  . traMADol (ULTRAM) 50 MG tablet Take 50-100 mg by mouth every 6 (six) hours as needed for moderate pain (depends on pain level if takes  1-2 tablets).     . TRESIBA FLEXTOUCH 100 UNIT/ML FlexTouch Pen INJECT 40 UNITS INTO THE SKIN AT BEDTIME. 15 mL 0  . zolpidem (AMBIEN) 10 MG tablet Take 5-10 mg by mouth at bedtime as needed for sleep (depends on insomnia if takes 0.5-1 tablet).      No current facility-administered medications for this visit.    Allergies as of 03/19/2020  . (No Known Allergies)    Family History  Problem Relation Age of Onset  . Heart failure Father        Died, 78  . Hypertension Mother        Living, 33  . Breast cancer Mother   . Kidney cancer Mother   . Hypercholesterolemia Son   . Breast cancer Sister   . Pancreatic cancer Brother        passed age 71  . Hypertension Sister   . Colon cancer Neg Hx     Social History   Socioeconomic History  . Marital status: Married    Spouse name: Marcello Moores  . Number of children: 2  . Years of education: Not on file  . Highest education level: Associate  degree: occupational, Hotel manager, or vocational program  Occupational History  . Occupation: disabled  Tobacco Use  . Smoking status: Former Smoker    Packs/day: 0.50    Years: 20.00    Pack years: 10.00    Types: Cigarettes    Quit date: 10/18/2011    Years since quitting: 8.4  . Smokeless tobacco: Never Used  . Tobacco comment: quit in 2014  Vaping Use  . Vaping Use: Never used  Substance and Sexual Activity  . Alcohol use: No  . Drug use: No  . Sexual activity: Not Currently    Birth control/protection: Post-menopausal  Other Topics Concern  . Not  on file  Social History Narrative   She is currently unemployed.  She was terminated on 06/15/2013.  She was previously a Loss adjuster, chartered, worked at [employer], and worked in Charity fundraiser for 25 years.   She lives with husband.  They have two children.   Social Determinants of Health   Financial Resource Strain:   . Difficulty of Paying Living Expenses:   Food Insecurity:   . Worried About Charity fundraiser in the Last Year:   . Arboriculturist in the Last Year:   Transportation Needs:   . Film/video editor (Medical):   Marland Kitchen Lack of Transportation (Non-Medical):   Physical Activity:   . Days of Exercise per Week:   . Minutes of Exercise per Session:   Stress:   . Feeling of Stress :   Social Connections:   . Frequency of Communication with Friends and Family:   . Frequency of Social Gatherings with Friends and Family:   . Attends Religious Services:   . Active Member of Clubs or Organizations:   . Attends Archivist Meetings:   Marland Kitchen Marital Status:     Review of Systems: Gen: Denies fever, chills, anorexia. Denies fatigue, weakness, weight loss.  CV: Denies chest pain, palpitations, syncope, peripheral edema, and claudication. Resp: Denies dyspnea at rest, cough, wheezing, coughing up blood, and pleurisy. GI: see HPI Derm: Denies rash, itching, dry skin Psych: Denies depression, anxiety, memory loss, confusion.  No homicidal or suicidal ideation.  Heme: Denies bruising, bleeding, and enlarged lymph nodes.  Physical Exam: BP (!) 132/76   Pulse 78   Temp 98.1 F (36.7 C) (Oral)   Ht 5\' 1"  (1.549 m)   Wt (!) 212 lb 6.4 oz (96.3 kg)   BMI 40.13 kg/m  General:   Alert and oriented. No distress noted. Pleasant and cooperative.  Head:  Normocephalic and atraumatic. Eyes:  Conjuctiva clear without scleral icterus. Mouth:  Mask in place Abdomen:  Obese, +BS, soft, non-tender and non-distended. No rebound or guarding. No HSM or masses noted. Msk:  Symmetrical without gross deformities. Normal posture. Extremities:  Without edema. Neurologic:  Alert and  oriented x4 Psych:  Alert and cooperative. Normal mood and affect.  ASSESSMENT: Erica Price is a 58 y.o. female presenting today in routine follow-up for GERD and long-standing diarrhea felt secondary to drug-effect (metformin), diabetes, post-cholecystectomy state.  GERD remains well-controlled without alarm signs/symptoms.  Encouragingly, she noted improvement with course of Xifaxan recently. She remains on Bentyl TID but without side effects. On her own, she is holding metformin and has already noted improvement with symptoms. Following blood sugars closely. Hopefully, she can remain off of this, as I suspect it is a driver for her symptoms.  Colonoscopy due Jan 2022 due to history of multiple polyps. We will have her return to see Dr. Abbey Chatters in December/Jan 2022 for visit prior to colonoscopy.    PLAN:  Holding metformin: follow blood sugars. Continue follow-up with Dr. Beatrix Fetters prn  Continue Protonix  Return in Dec/Jan 2022 to see Dr. Abbey Chatters  Colonoscopy Jan 2022 for surveillance purposes   Annitta Needs, PhD, [facility]

## 2020-03-22 DIAGNOSIS — G894 Chronic pain syndrome: Secondary | ICD-10-CM | POA: Diagnosis not present

## 2020-03-22 DIAGNOSIS — E7849 Other hyperlipidemia: Secondary | ICD-10-CM | POA: Diagnosis not present

## 2020-03-22 DIAGNOSIS — I10 Essential (primary) hypertension: Secondary | ICD-10-CM | POA: Diagnosis not present

## 2020-03-22 DIAGNOSIS — E1165 Type 2 diabetes mellitus with hyperglycemia: Secondary | ICD-10-CM | POA: Diagnosis not present

## 2020-04-08 ENCOUNTER — Other Ambulatory Visit: Payer: Self-pay | Admitting: "Endocrinology

## 2020-04-09 ENCOUNTER — Other Ambulatory Visit: Payer: Self-pay | Admitting: "Endocrinology

## 2020-05-16 ENCOUNTER — Other Ambulatory Visit: Payer: Self-pay | Admitting: "Endocrinology

## 2020-05-16 DIAGNOSIS — E782 Mixed hyperlipidemia: Secondary | ICD-10-CM | POA: Diagnosis not present

## 2020-05-16 DIAGNOSIS — E1165 Type 2 diabetes mellitus with hyperglycemia: Secondary | ICD-10-CM | POA: Diagnosis not present

## 2020-05-16 LAB — LIPID PANEL
Cholesterol: 104 (ref 0–200)
HDL: 47 (ref 35–70)
LDL Cholesterol: 37
Triglycerides: 106 (ref 40–160)

## 2020-05-16 LAB — COMPREHENSIVE METABOLIC PANEL
Calcium: 10 (ref 8.7–10.7)
GFR calc Af Amer: 75
GFR calc non Af Amer: 65

## 2020-05-16 LAB — BASIC METABOLIC PANEL
BUN: 13 (ref 4–21)
Creatinine: 1 (ref 0.5–1.1)

## 2020-05-16 LAB — TSH: TSH: 2.72 (ref 0.41–5.90)

## 2020-05-17 LAB — COMPREHENSIVE METABOLIC PANEL
ALT: 19 IU/L (ref 0–32)
AST: 15 IU/L (ref 0–40)
Albumin/Globulin Ratio: 1.5 (ref 1.2–2.2)
Albumin: 4.3 g/dL (ref 3.8–4.9)
Alkaline Phosphatase: 62 IU/L (ref 44–121)
BUN/Creatinine Ratio: 13 (ref 9–23)
BUN: 13 mg/dL (ref 6–24)
Bilirubin Total: 0.4 mg/dL (ref 0.0–1.2)
CO2: 25 mmol/L (ref 20–29)
Calcium: 10 mg/dL (ref 8.7–10.2)
Chloride: 100 mmol/L (ref 96–106)
Creatinine, Ser: 0.97 mg/dL (ref 0.57–1.00)
GFR calc Af Amer: 75 mL/min/{1.73_m2} (ref >59)
GFR calc non Af Amer: 65 mL/min/{1.73_m2} (ref >59)
Globulin, Total: 2.8 g/dL (ref 1.5–4.5)
Glucose: 167 mg/dL — ABNORMAL HIGH (ref 65–99)
Potassium: 5.4 mmol/L — ABNORMAL HIGH (ref 3.5–5.2)
Sodium: 138 mmol/L (ref 134–144)
Total Protein: 7.1 g/dL (ref 6.0–8.5)

## 2020-05-17 LAB — T4, FREE: Free T4: 1.23 ng/dL (ref 0.82–1.77)

## 2020-05-17 LAB — LIPID PANEL W/O CHOL/HDL RATIO
Cholesterol, Total: 104 mg/dL (ref 100–199)
HDL: 47 mg/dL (ref >39)
LDL Chol Calc (NIH): 37 mg/dL (ref 0–99)
Triglycerides: 106 mg/dL (ref 0–149)
VLDL Cholesterol Cal: 20 mg/dL (ref 5–40)

## 2020-05-17 LAB — TSH: TSH: 2.72 u[IU]/mL (ref 0.450–4.500)

## 2020-05-20 DIAGNOSIS — Z23 Encounter for immunization: Secondary | ICD-10-CM | POA: Diagnosis not present

## 2020-05-20 DIAGNOSIS — G894 Chronic pain syndrome: Secondary | ICD-10-CM | POA: Diagnosis not present

## 2020-05-20 DIAGNOSIS — M797 Fibromyalgia: Secondary | ICD-10-CM | POA: Diagnosis not present

## 2020-05-23 ENCOUNTER — Encounter: Payer: Self-pay | Admitting: Nurse Practitioner

## 2020-05-23 ENCOUNTER — Ambulatory Visit (INDEPENDENT_AMBULATORY_CARE_PROVIDER_SITE_OTHER): Payer: Medicare Other | Admitting: Nurse Practitioner

## 2020-05-23 ENCOUNTER — Other Ambulatory Visit: Payer: Self-pay

## 2020-05-23 VITALS — BP 135/83 | HR 76 | Ht 61.0 in | Wt 215.8 lb

## 2020-05-23 DIAGNOSIS — E782 Mixed hyperlipidemia: Secondary | ICD-10-CM | POA: Diagnosis not present

## 2020-05-23 DIAGNOSIS — M1990 Unspecified osteoarthritis, unspecified site: Secondary | ICD-10-CM | POA: Diagnosis not present

## 2020-05-23 DIAGNOSIS — E1165 Type 2 diabetes mellitus with hyperglycemia: Secondary | ICD-10-CM

## 2020-05-23 DIAGNOSIS — E7849 Other hyperlipidemia: Secondary | ICD-10-CM | POA: Diagnosis not present

## 2020-05-23 DIAGNOSIS — I10 Essential (primary) hypertension: Secondary | ICD-10-CM | POA: Diagnosis not present

## 2020-05-23 LAB — POCT GLYCOSYLATED HEMOGLOBIN (HGB A1C): Hemoglobin A1C: 6.9 % — AB (ref 4.0–5.6)

## 2020-05-23 LAB — POCT UA - MICROALBUMIN
Albumin/Creatinine Ratio, Urine, POC: <30
Creatinine, POC: 300 mg/dL
Microalbumin Ur, POC: 30 mg/L

## 2020-05-23 MED ORDER — LOSARTAN POTASSIUM 25 MG PO TABS: 25.0000 mg | ORAL_TABLET | Freq: Every day | ORAL | 3 refills | Status: DC

## 2020-05-23 MED ORDER — TRESIBA FLEXTOUCH 100 UNIT/ML ~~LOC~~ SOPN: 40.0000 [IU] | PEN_INJECTOR | Freq: Every day | SUBCUTANEOUS | 3 refills | Status: DC

## 2020-05-23 MED ORDER — LISINOPRIL 5 MG PO TABS
5.0000 mg | ORAL_TABLET | Freq: Every day | ORAL | 3 refills | Status: DC
Start: 2020-05-23 — End: 2020-05-23

## 2020-05-23 NOTE — Patient Instructions (Signed)

## 2020-05-23 NOTE — Progress Notes (Signed)
05/23/2020, 10:46 AM  Endocrinology follow-up note   Subjective:    Patient ID: Erica Price, female    DOB: [birth year].  Erica Price is being seen  in follow-up for management of currently uncontrolled symptomatic diabetes requested by  [name], MD.   Past Medical History:  Diagnosis Date   Apnea    Poss OSA   Arthralgia    Arthritis    Depression    Diabetes mellitus without complication (HCC)    Fatigue    Fibromyalgia    GERD (gastroesophageal reflux disease)    IBS (irritable bowel syndrome)    Myalgia    Pinched nerve    [id number]   PONV (postoperative nausea and vomiting)    Snoring     Past Surgical History:  Procedure Laterality Date   ARTHROSCOPIC REPAIR ACL Left    BIOPSY N/A 06/12/2014   Procedure: BIOPSY;  Surgeon: Danie Binder, MD;  Location: [facility];  Service: Endoscopy;  Laterality: N/A;   BIOPSY  09/14/2017   Procedure: BIOPSY;  Surgeon: Danie Binder, MD;  Location: [facility];  Service: Endoscopy;;  colon   CARPAL TUNNEL RELEASE Right    CHOLECYSTECTOMY  1990s   CLUB FOOT RELEASE Left    x3   COLONOSCOPY WITH PROPOFOL N/A 06/12/2014   Dr. Suzette Battiest colon polyps removed/moderate divertiiculosis/small internal hemorrhoids/moderate sized external hemorrhoids. tubular adenomas. Next surveillance in Oct 2018.    COLONOSCOPY WITH PROPOFOL N/A 09/14/2017   Dr. Oneida Alar: simple adenomas, moderate diverticulosis in rectosigmoid colon, sigmoid colon, and descending colon. External and internal hemorrhoids, normal colon biopsies. simple adenomas. Needs surveillance in 3 years with Propofol, colowrap   ESOPHAGOGASTRODUODENOSCOPY (EGD) WITH PROPOFOL N/A 06/12/2014   Dr. 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 benign gastric and duodenal biopsies   POLYPECTOMY N/A 06/12/2014   Procedure: POLYPECTOMY;   Surgeon: Danie Binder, MD;  Location: [facility];  Service: Endoscopy;  Laterality: N/A;   POLYPECTOMY  09/14/2017   Procedure: POLYPECTOMY;  Surgeon: Danie Binder, MD;  Location: [facility];  Service: Endoscopy;;  colon   SAVORY DILATION N/A 06/12/2014   Procedure: SAVORY DILATION;  Surgeon: Danie Binder, MD;  Location: [facility];  Service: Endoscopy;  Laterality: N/A;  12.8-17    Social History   Socioeconomic History   Marital status: Married    Spouse name: Marcello Moores   Number of children: 2   Years of education: Not on file   Highest education level: Associate degree: occupational, Hotel manager, or vocational program  Occupational History   Occupation: disabled  Tobacco Use   Smoking status: Former Smoker    Packs/day: 0.50    Years: 20.00    Pack years: 10.00    Types: Cigarettes    Quit date: 10/18/2011    Years since quitting: 8.6   Smokeless tobacco: Never Used   Tobacco comment: quit in 2014  Vaping Use   Vaping Use: Never used  Substance and Sexual Activity   Alcohol use: No   Drug use: No   Sexual activity: Not Currently    Birth control/protection: Post-menopausal  Other Topics Concern   Not on file  Social History Narrative   She is currently unemployed.  She was terminated on 06/15/2013.  She was previously a Loss adjuster, chartered, worked at [employer], and worked in Charity fundraiser for 25 years.   She lives with husband.  They have two children.   Social Determinants of Health   Financial Resource Strain:    Difficulty of Paying Living Expenses: Not on file  Food Insecurity:    Worried About Charity fundraiser in the Last Year: Not on file   [employer] of Food in the Last Year: Not on file  Transportation Needs:    Lack of Transportation (Medical): Not on file   Lack of Transportation (Non-Medical): Not on file  Physical Activity:    Days of Exercise per Week: Not on file   Minutes of Exercise per Session: Not on file  Stress:    Feeling of Stress  : Not on file  Social Connections:    Frequency of Communication with Friends and Family: Not on file   Frequency of Social Gatherings with Friends and Family: Not on file   Attends Religious Services: Not on file   Active Member of Clubs or Organizations: Not on file   Attends Archivist Meetings: Not on file   Marital Status: Not on file    Family History  Problem Relation Age of Onset   Heart failure Father        Died, 1   Hypertension Mother        Living, 76   Breast cancer Mother    Kidney cancer Mother    Hypercholesterolemia Son    Breast cancer Sister    Pancreatic cancer Brother        passed age 38   Hypertension Sister    Colon cancer Neg Hx     Outpatient Encounter Medications as of 05/23/2020  Medication Sig   albuterol (PROVENTIL HFA;VENTOLIN HFA) 108 (90 Base) MCG/ACT inhaler Inhale 2 puffs into the lungs every 6 (six) hours as needed for wheezing or shortness of breath.   aluminum chloride (DRYSOL) 20 % external solution Apply topically as needed.    Artificial Tear Solution (SOOTHE XP) SOLN Apply 1 drop to eye daily as needed (dry eyes).   Cholecalciferol (VITAMIN D3) 5000 units CAPS 10,000 Units daily.    dicyclomine (BENTYL) 10 MG capsule TAKE 1 CAPSULE BY MOUTH FOUR TIMES DAILY BEFORE MEALS AND AT BEDTIME FOR LOOSE STOOL AND ABDOMINAL CRAMPING. (Patient taking differently: 10 mg. )   Dulaglutide (TRULICITY) 1.5 UK/0.2RK SOPN Inject 1.5 mg into the skin once a week.   DULoxetine (CYMBALTA) 60 MG capsule Take 60 mg daily by mouth.    fluticasone (FLONASE) 50 MCG/ACT nasal spray Place 2 sprays into both nostrils daily as needed for allergies or rhinitis.   furosemide (LASIX) 20 MG tablet Take 20 mg by mouth daily as needed for fluid or edema.    gabapentin (NEURONTIN) 300 MG capsule Take 600 mg by mouth 3 (three) times daily.   insulin degludec (TRESIBA FLEXTOUCH) 100 UNIT/ML FlexTouch Pen Inject 40 Units into the skin at  bedtime.   Ketorolac Tromethamine (TORADOL IJ) Inject as directed as needed. Gets at dr office   metFORMIN (GLUCOPHAGE-XR) 500 MG 24 hr tablet TAKE 1 TABLET BY MOUTH TWICE DAILY AFTER A MEAL.   NYAMYC powder    pantoprazole (PROTONIX) 40 MG tablet TAKE ONE TABLET BY MOUTH DAILY 30 MINUTES PRIOR TO BREAKFAST.  rosuvastatin (CRESTOR) 10 MG tablet TAKE ONE TABLET BY MOUTH ONCE DAILY.   SURE COMFORT PEN NEEDLES 31G X 8 MM MISC USE DAILY WITH TRESIBA.   traMADol (ULTRAM) 50 MG tablet Take 50-100 mg by mouth every 6 (six) hours as needed for moderate pain (depends on pain level if takes  1-2 tablets).    zolpidem (AMBIEN) 10 MG tablet Take 5-10 mg by mouth at bedtime as needed for sleep (depends on insomnia if takes 0.5-1 tablet).    [DISCONTINUED] TRESIBA FLEXTOUCH 100 UNIT/ML FlexTouch Pen INJECT 40 UNITS INTO THE SKIN AT BEDTIME.   losartan (COZAAR) 25 MG tablet Take 1 tablet (25 mg total) by mouth daily.   [DISCONTINUED] lisinopril (ZESTRIL) 5 MG tablet Take 1 tablet (5 mg total) by mouth daily.   No facility-administered encounter medications on file as of 05/23/2020.    ALLERGIES: No Known Allergies  VACCINATION STATUS:  There is no immunization history on file for this patient.  Diabetes She presents for her follow-up diabetic visit. She has type 2 diabetes mellitus. Onset time: She was diagnosed at approximate age of 37 years. Her disease course has been improving. There are no hypoglycemic associated symptoms. Pertinent negatives for hypoglycemia include no confusion, headaches, pallor or seizures. Associated symptoms include fatigue. Pertinent negatives for diabetes include no chest pain, no polydipsia, no polyphagia and no polyuria. There are no hypoglycemic complications. Symptoms are improving. There are no diabetic complications. Risk factors for coronary artery disease include diabetes mellitus, obesity, sedentary lifestyle, post-menopausal, tobacco exposure and  dyslipidemia. Current diabetic treatment includes insulin injections and oral agent (monotherapy). She is compliant with treatment most of the time. Her weight is increasing steadily. She is following a generally unhealthy diet. When asked about meal planning, she reported none. She has not had a previous visit with a dietitian. She rarely participates in exercise. Her home blood glucose trend is decreasing steadily. Her breakfast blood glucose range is generally 140-180 mg/dl. (She presents today with her meter, no logs, showing above target fasting and postprandial glycemic profile.  She mostly only checks blood glucose once daily, instead of twice daily.  Her POCT A1C today is 6.9%, unchanged from last visit.  There are no reported or documented episodes of hypoglycemia.  She reports her PCP is treating her for fibromyalgia flare with injectable steroids on and off.  She also reports she changes how she takes her metformin when she has a flare of diarrhea and will only take it once daily before bed which helps her symptoms.) An ACE inhibitor/angiotensin II receptor blocker is not being taken. She does not see a podiatrist.Eye exam is current.  Hyperlipidemia This is a chronic problem. The current episode started more than 1 year ago. The problem is controlled. Recent lipid tests were reviewed and are normal. Exacerbating diseases include chronic renal disease, diabetes and obesity. Factors aggravating her hyperlipidemia include fatty foods. Pertinent negatives include no chest pain. Current antihyperlipidemic treatment includes statins. The current treatment provides mild improvement of lipids. Compliance problems include adherence to diet and adherence to exercise.  Risk factors for coronary artery disease include diabetes mellitus, dyslipidemia, obesity, a sedentary lifestyle and post-menopausal.    Review of systems  Constitutional: + Minimally fluctuating body weight,  current Body mass index is 40.78  kg/m. , no fatigue, no subjective hyperthermia, no subjective hypothermia Eyes: no blurry vision, no xerophthalmia ENT: no sore throat, no nodules palpated in throat, no dysphagia/odynophagia, no hoarseness Cardiovascular: no chest pain, no shortness of  breath, no palpitations, no leg swelling Respiratory: no cough, no shortness of breath Gastrointestinal: no nausea/vomiting/diarrhea Musculoskeletal: no muscle/joint aches Skin: no rashes, no hyperemia Neurological: no tremors, no numbness, no tingling, no dizziness Psychiatric: no depression, no anxiety    Objective:    BP 135/83 (BP Location: Left Arm, Patient Position: Sitting)    Pulse 76    Ht 5\' 1"  (1.549 m)    Wt 215 lb 12.8 oz (97.9 kg)    BMI 40.78 kg/m   Wt Readings from Last 3 Encounters:  05/23/20 215 lb 12.8 oz (97.9 kg)  03/19/20 (!) 212 lb 6.4 oz (96.3 kg)  01/16/20 210 lb 9.6 oz (95.5 kg)    BP Readings from Last 3 Encounters:  05/23/20 135/83  03/19/20 (!) 132/76  01/16/20 124/77    Physical Exam- Limited  Constitutional:  Body mass index is 40.78 kg/m. , not in acute distress, normal state of mind Eyes:  EOMI, no exophthalmos Neck: Supple Thyroid: No gross goiter Cardiovascular: RRR, no murmers, rubs, or gallops, no edema Respiratory: Adequate breathing efforts, no crackles, rales, rhonchi, or wheezing Musculoskeletal: no gross deformities, strength intact in all four extremities, no gross restriction of joint movements Skin:  no rashes, no hyperemia Neurological: no tremor with outstretched hands  CMP ( most recent) CMP     Component Value Date/Time   NA 142 06/06/2019 0951   K 4.9 06/06/2019 0951   CL 104 06/06/2019 0951   CO2 24 06/06/2019 0951   GLUCOSE 147 (H) 06/06/2019 0951   GLUCOSE 185 (H) 07/27/2017 0916   BUN 13 05/16/2020 0000   CREATININE 1.0 05/16/2020 0000   CREATININE 0.84 06/06/2019 0951   CALCIUM 10.0 05/16/2020 0000   PROT 6.4 06/06/2019 0951   ALBUMIN 4.0 06/06/2019 0951    AST 10 06/06/2019 0951   ALT 11 06/06/2019 0951   ALKPHOS 75 06/06/2019 0951   BILITOT <0.2 06/06/2019 0951   GFRNONAA 65 05/16/2020 0000   GFRAA 75 05/16/2020 0000    Lipid Panel     Component Value Date/Time   CHOL 104 05/16/2020 0000   CHOL 177 02/28/2019 1048   TRIG 106 05/16/2020 0000   HDL 47 05/16/2020 0000   HDL 41 02/28/2019 1048   LDLCALC 37 05/16/2020 0000   LDLCALC 97 02/28/2019 1048   LABVLDL 39 02/28/2019 1048      Assessment & Plan:   1. Uncontrolled type 2 diabetes mellitus with hyperglycemia ([place])  - Erica Price has currently uncontrolled symptomatic type 2 DM since  59 years of age.  She presents today with her meter, no logs, showing above target fasting and postprandial glycemic profile.  She mostly only checks blood glucose once daily, instead of twice daily.  Her POCT A1C today is 6.9%, unchanged from last visit.  There are no reported or documented episodes of hypoglycemia.  She reports her PCP is treating her for fibromyalgia flare with injectable steroids on and off.  She also reports she changes how she takes her metformin when she has a flare of diarrhea and will only take it once daily before bed which helps her symptoms.  -her diabetes is complicated by obesity/sedentary life and she remains at a high risk for more acute and chronic complications which include CAD, CVA, CKD, retinopathy, and neuropathy. These are all discussed in detail with her.  - I have counseled her on diet management and weight loss, by adopting a carbohydrate restricted/protein rich diet.  - The patient admits there  is a room for improvement in their diet and drink choices. -  Suggestion is made for the patient to avoid simple carbohydrates from their diet including Cakes, Sweet Desserts / Pastries, Ice Cream, Soda (diet and regular), Sweet Tea, Candies, Chips, Cookies, Sweet Pastries,  Store Bought Juices, Alcohol in Excess of  1-2 drinks a day, Artificial Sweeteners, Coffee  Creamer, and "Sugar-free" Products. This will help patient to have stable blood glucose profile and potentially avoid unintended weight gain.   - I encouraged the patient to switch to  unprocessed or minimally processed complex starch and increased protein intake (animal or plant source), fruits, and vegetables.   - Patient is advised to stick to a routine mealtimes to eat 3 meals  a day and avoid unnecessary snacks ( to snack only to correct hypoglycemia).  - she will be scheduled with Jearld Fenton, RDN, CDE for individualized diabetes education.  - I have approached her with the following individualized plan to manage diabetes and patient agrees:   -Based on her presenting glycemic profile, she will not need prandial insulin for now.    -Given her stable glycemic profile, she is advised to continue Tresiba 40 units SQ nightly, Metformin 500 mg ER twice daily with meals, and Trulicity 1.5 mg SQ weekly.  Samples of Tyler Aas provided from office today.  -She is encouraged to consistently monitor glucose twice daily, before breakfast and before bed and call the clinic if she has readings less than 70 or greater than 200 for 3 tests in a row.  - Patient specific target  A1c;  LDL, HDL, Triglycerides,  were discussed in detail.  2) Blood Pressure /Hypertension: Her blood pressure is controlled to target.  She is advised to continue Lasix 40 mg po daily prn for fluid. She is approached for low dose ACE/ARB since BP has been over 130/80 for last 2 visits as it will provide some renal protection.  She agrees.  Rx for Losartan 25 mg po daily sent to patient pharmacy.   3) Lipids/Hyperlipidemia:   Her most recent lipid panel on 05/16/20 shows controlled LDL at 37.  She is advised to continue Crestor 10 mg po daily before bed.  Side effects and precautions discussed with her.  4)  Weight/Diet:  Her Body mass index is 40.78 kg/m.-   clearly complicating her diabetes care.  She is a candidate for  modest weight loss.  I discussed with her the fact that loss of 5 - 10% of her  current body weight will have the most impact on her diabetes management.  CDE Consult will be initiated . Exercise, and detailed carbohydrates information provided  -  detailed on discharge instructions.  5) Chronic Care/Health Maintenance: -she on ARB and statin medication and is encouraged to initiate and continue to follow up with Ophthalmology, Dentist,  Podiatrist at least yearly or according to recommendations, and advised to   stay away from smoking. I have recommended yearly flu vaccine and pneumonia vaccine at least every 5 years; moderate intensity exercise for up to 150 minutes weekly; and  sleep for at least 7 hours a day.  - she is  advised to maintain close follow up with [name], MD for primary care needs, as well as her other providers for optimal and coordinated care.   - Time spent on this patient care encounter:  35 min, of which > 50% was spent in  counseling and the rest reviewing her blood glucose logs , discussing her hypoglycemia  and hyperglycemia episodes, reviewing her current and  previous labs / studies  ( including abstraction from other facilities) and medications  doses and developing a  long term treatment plan and documenting her care.   Please refer to Patient Instructions for Blood Glucose Monitoring and Insulin/Medications Dosing Guide"  in media tab for additional information. Please  also refer to " Patient Self Inventory" in the Media  tab for reviewed elements of pertinent patient history.  Erica Price participated in the discussions, expressed understanding, and voiced agreement with the above plans.  All questions were answered to her satisfaction. she is encouraged to contact clinic should she have any questions or concerns prior to her return visit.    Follow up plan: - Return in about 4 months (around 09/22/2020) for Diabetes follow up, ABI next visit, Virtual visit  ok, Previsit labs.  Rayetta Pigg, [facility] [address] [place], [place] [address] Phone: [phone] Fax: [phone]  05/23/2020, 10:46 AM  This note was partially dictated with voice recognition software. Similar sounding words can be transcribed inadequately or may not  be corrected upon review.

## 2020-05-24 ENCOUNTER — Telehealth: Payer: Self-pay | Admitting: "Endocrinology

## 2020-05-24 NOTE — Telephone Encounter (Signed)
Please advise 

## 2020-05-24 NOTE — Telephone Encounter (Signed)
I spoke with patient.  The pharmacy had sent her Losartan and Lisinopril (I had canceled the lisinopril order as Losartan is a better choice for the patient).  She is aware that she is only to take the Losartan for her BP.

## 2020-05-24 NOTE — Telephone Encounter (Signed)
Pt was seen yesterday and is requesting Whitney to call her back and explain the 3 medications she put her on yesterday and she looked and 2 were to treat the same thing.

## 2020-06-01 ENCOUNTER — Other Ambulatory Visit: Payer: Self-pay | Admitting: "Endocrinology

## 2020-06-06 DIAGNOSIS — H04123 Dry eye syndrome of bilateral lacrimal glands: Secondary | ICD-10-CM | POA: Diagnosis not present

## 2020-06-06 DIAGNOSIS — H40033 Anatomical narrow angle, bilateral: Secondary | ICD-10-CM | POA: Diagnosis not present

## 2020-06-22 DIAGNOSIS — E7849 Other hyperlipidemia: Secondary | ICD-10-CM | POA: Diagnosis not present

## 2020-06-22 DIAGNOSIS — E1165 Type 2 diabetes mellitus with hyperglycemia: Secondary | ICD-10-CM | POA: Diagnosis not present

## 2020-06-22 DIAGNOSIS — M1990 Unspecified osteoarthritis, unspecified site: Secondary | ICD-10-CM | POA: Diagnosis not present

## 2020-06-22 DIAGNOSIS — I10 Essential (primary) hypertension: Secondary | ICD-10-CM | POA: Diagnosis not present

## 2020-06-24 ENCOUNTER — Telehealth: Payer: Self-pay

## 2020-06-24 MED ORDER — TRULICITY 1.5 MG/0.5ML ~~LOC~~ SOAJ: 1.5000 mg | SUBCUTANEOUS | 3 refills | Status: DC

## 2020-06-24 NOTE — Telephone Encounter (Signed)
Pt said she needs a new RX sent in for her Trulicity to [facility] at [phone]. They said the one they have on file is expired.

## 2020-06-24 NOTE — Telephone Encounter (Signed)
New Rx faxed to [facility]

## 2020-07-09 ENCOUNTER — Other Ambulatory Visit: Payer: Self-pay | Admitting: Nurse Practitioner

## 2020-07-10 ENCOUNTER — Other Ambulatory Visit: Payer: Self-pay | Admitting: "Endocrinology

## 2020-08-12 ENCOUNTER — Encounter: Payer: Self-pay | Admitting: Internal Medicine

## 2020-08-20 ENCOUNTER — Encounter: Payer: Self-pay | Admitting: Internal Medicine

## 2020-08-21 ENCOUNTER — Other Ambulatory Visit: Payer: Self-pay | Admitting: Gastroenterology

## 2020-08-23 DIAGNOSIS — E1165 Type 2 diabetes mellitus with hyperglycemia: Secondary | ICD-10-CM | POA: Diagnosis not present

## 2020-08-23 DIAGNOSIS — M1990 Unspecified osteoarthritis, unspecified site: Secondary | ICD-10-CM | POA: Diagnosis not present

## 2020-08-23 DIAGNOSIS — E7849 Other hyperlipidemia: Secondary | ICD-10-CM | POA: Diagnosis not present

## 2020-08-23 DIAGNOSIS — I10 Essential (primary) hypertension: Secondary | ICD-10-CM | POA: Diagnosis not present

## 2020-08-27 DIAGNOSIS — M797 Fibromyalgia: Secondary | ICD-10-CM | POA: Diagnosis not present

## 2020-09-10 ENCOUNTER — Ambulatory Visit (INDEPENDENT_AMBULATORY_CARE_PROVIDER_SITE_OTHER): Payer: Medicare Other

## 2020-09-10 ENCOUNTER — Encounter: Payer: Self-pay | Admitting: Emergency Medicine

## 2020-09-10 ENCOUNTER — Other Ambulatory Visit: Payer: Self-pay

## 2020-09-10 ENCOUNTER — Ambulatory Visit
Admission: EM | Admit: 2020-09-10 | Discharge: 2020-09-10 | Disposition: A | Discharge status: Home / Self Care | Payer: Medicare Other | Attending: Family Medicine | Admitting: Family Medicine

## 2020-09-10 DIAGNOSIS — R059 Cough, unspecified: Secondary | ICD-10-CM

## 2020-09-10 DIAGNOSIS — R5383 Other fatigue: Secondary | ICD-10-CM | POA: Diagnosis not present

## 2020-09-10 DIAGNOSIS — B349 Viral infection, unspecified: Secondary | ICD-10-CM

## 2020-09-10 DIAGNOSIS — Z20822 Contact with and (suspected) exposure to covid-19: Secondary | ICD-10-CM

## 2020-09-10 DIAGNOSIS — R509 Fever, unspecified: Secondary | ICD-10-CM

## 2020-09-10 DIAGNOSIS — R0602 Shortness of breath: Secondary | ICD-10-CM

## 2020-09-10 NOTE — ED Provider Notes (Signed)
[facility]   [id number] 09/10/20 Arrival Time: 0102   CC: COVID symptoms  SUBJECTIVE: History from: patient.  Erica Price is a 60 y.o. female who presents with dizziness, fever, productive cough since yesterday. Reports that O2 sats this morning at home were 90%. Reports that they are usually 95% and above. Reports that she was seen by PCP on 08/27/20 and was treated with a z-pack. Reports that she felt a little better and now is much worse. She has a medical history significant for obesity, type 2 diabetes, fibromyalgia. Denies sick exposure to COVID, flu or strep. Denies recent travel. Has positive history of Covid in 02/2019. Has not completed Covid vaccines. Has not taken OTC medications for this. There are no aggravating or alleviating factors. Denies previous symptoms in the past. Denies fever, chills, sinus pain, rhinorrhea, sore throat, SOB, wheezing, chest pain, nausea, changes in bowel or bladder habits.    ROS: As per HPI.  All other pertinent ROS negative.     Past Medical History:  Diagnosis Date  . Apnea    Poss OSA  . Arthralgia   . Arthritis   . Depression   . Diabetes mellitus without complication ([place])   . Fatigue   . Fibromyalgia   . GERD (gastroesophageal reflux disease)   . IBS (irritable bowel syndrome)   . Myalgia   . Pinched nerve    [id number]  . PONV (postoperative nausea and vomiting)   . Snoring    Past Surgical History:  Procedure Laterality Date  . ARTHROSCOPIC REPAIR ACL Left   . BIOPSY N/A 06/12/2014   Procedure: BIOPSY;  Surgeon: Danie Binder, MD;  Location: [facility];  Service: Endoscopy;  Laterality: N/A;  . BIOPSY  09/14/2017   Procedure: BIOPSY;  Surgeon: Danie Binder, MD;  Location: [facility];  Service: Endoscopy;;  colon  . CARPAL TUNNEL RELEASE Right   . CHOLECYSTECTOMY  1990s  . CLUB FOOT RELEASE Left    x3  . COLONOSCOPY WITH PROPOFOL N/A 06/12/2014   Dr. Suzette Battiest colon polyps removed/moderate  divertiiculosis/small internal hemorrhoids/moderate sized external hemorrhoids. tubular adenomas. Next surveillance in Oct 2018.   Marland Kitchen COLONOSCOPY WITH PROPOFOL N/A 09/14/2017   Dr. Oneida Alar: simple adenomas, moderate diverticulosis in rectosigmoid colon, sigmoid colon, and descending colon. External and internal hemorrhoids, normal colon biopsies. simple adenomas. Needs surveillance in 3 years with Propofol, colowrap  . ESOPHAGOGASTRODUODENOSCOPY (EGD) WITH PROPOFOL N/A 06/12/2014   Dr. 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 benign gastric and duodenal biopsies  . POLYPECTOMY N/A 06/12/2014   Procedure: POLYPECTOMY;  Surgeon: Danie Binder, MD;  Location: [facility];  Service: Endoscopy;  Laterality: N/A;  . POLYPECTOMY  09/14/2017   Procedure: POLYPECTOMY;  Surgeon: Danie Binder, MD;  Location: [facility];  Service: Endoscopy;;  colon  . SAVORY DILATION N/A 06/12/2014   Procedure: SAVORY DILATION;  Surgeon: Danie Binder, MD;  Location: [facility];  Service: Endoscopy;  Laterality: N/A;  12.8-17   No Known Allergies No current facility-administered medications on file prior to encounter.   Current Outpatient Medications on File Prior to Encounter  Medication Sig Dispense Refill  . albuterol (PROVENTIL HFA;VENTOLIN HFA) 108 (90 Base) MCG/ACT inhaler Inhale 2 puffs into the lungs every 6 (six) hours as needed for wheezing or shortness of breath.    Marland Kitchen aluminum chloride (DRYSOL) 20 % external solution Apply topically as needed.     . Artificial Tear Solution (SOOTHE XP) SOLN  Apply 1 drop to eye daily as needed (dry eyes).    . Cholecalciferol (VITAMIN D3) 5000 units CAPS 10,000 Units daily.     Marland Kitchen dicyclomine (BENTYL) 10 MG capsule Take 1 capsule (10 mg total) by mouth 4 (four) times daily as needed. 180 capsule 1  . Dulaglutide (TRULICITY) 1.5 0000000 SOPN Inject 1.5 mg into the skin once a week. 8 mL 3  . DULoxetine (CYMBALTA) 60 MG capsule Take 60 mg daily  by mouth.     . fluticasone (FLONASE) 50 MCG/ACT nasal spray Place 2 sprays into both nostrils daily as needed for allergies or rhinitis.    . furosemide (LASIX) 20 MG tablet Take 20 mg by mouth daily as needed for fluid or edema.     . gabapentin (NEURONTIN) 300 MG capsule Take 600 mg by mouth 3 (three) times daily.    . insulin degludec (TRESIBA FLEXTOUCH) 100 UNIT/ML FlexTouch Pen Inject 40 Units into the skin at bedtime. 36 mL 3  . Ketorolac Tromethamine (TORADOL IJ) Inject as directed as needed. Gets at dr office    . losartan (COZAAR) 25 MG tablet Take 1 tablet (25 mg total) by mouth daily. 90 tablet 3  . metFORMIN (GLUCOPHAGE-XR) 500 MG 24 hr tablet TAKE 1 TABLET BY MOUTH TWICE DAILY AFTER A MEAL. 180 tablet 0  . NYAMYC powder     . pantoprazole (PROTONIX) 40 MG tablet TAKE ONE TABLET BY MOUTH DAILY 30 MINUTES PRIOR TO BREAKFAST. 90 tablet 3  . rosuvastatin (CRESTOR) 10 MG tablet TAKE ONE TABLET BY MOUTH ONCE DAILY. 90 tablet 0  . SURE COMFORT PEN NEEDLES 31G X 8 MM MISC USE DAILY WITH TRESIBA. 100 each 1  . traMADol (ULTRAM) 50 MG tablet Take 50-100 mg by mouth every 6 (six) hours as needed for moderate pain (depends on pain level if takes  1-2 tablets).     . zolpidem (AMBIEN) 10 MG tablet Take 5-10 mg by mouth at bedtime as needed for sleep (depends on insomnia if takes 0.5-1 tablet).      Social History   Socioeconomic History  . Marital status: Married    Spouse name: Marcello Moores  . Number of children: 2  . Years of education: Not on file  . Highest education level: Associate degree: occupational, Hotel manager, or vocational program  Occupational History  . Occupation: disabled  Tobacco Use  . Smoking status: Former Smoker    Packs/day: 0.50    Years: 20.00    Pack years: 10.00    Types: Cigarettes    Quit date: 10/18/2011    Years since quitting: 8.9  . Smokeless tobacco: Never Used  . Tobacco comment: quit in 2014  Vaping Use  . Vaping Use: Never used  Substance and Sexual  Activity  . Alcohol use: No  . Drug use: No  . Sexual activity: Not Currently    Birth control/protection: Post-menopausal  Other Topics Concern  . Not on file  Social History Narrative   She is currently unemployed.  She was terminated on 06/15/2013.  She was previously a Loss adjuster, chartered, worked at [employer], and worked in Charity fundraiser for 25 years.   She lives with husband.  They have two children.   Social Determinants of Health   Financial Resource Strain: Not on file  Food Insecurity: Not on file  Transportation Needs: Not on file  Physical Activity: Not on file  Stress: Not on file  Social Connections: Not on file  Intimate Partner Violence: Not on file  Family History  Problem Relation Age of Onset  . Heart failure Father        Died, 78  . Hypertension Mother        Living, 24  . Breast cancer Mother   . Kidney cancer Mother   . Hypercholesterolemia Son   . Breast cancer Sister   . Pancreatic cancer Brother        passed age 61  . Hypertension Sister   . Colon cancer Neg Hx     OBJECTIVE:  Vitals:   09/10/20 1504 09/10/20 1505  BP: 129/81   Pulse: 97   Resp: 18   Temp: 98.9 F (37.2 C)   TempSrc: Oral   SpO2: 95%   Weight:  212 lb (96.2 kg)  Height:  5\' 2"  (1.575 m)     General appearance: alert; appears fatigued, but nontoxic; speaking in full sentences and tolerating own secretions HEENT: NCAT; Ears: EACs clear, TMs pearly gray; Eyes: PERRL.  EOM grossly intact. Sinuses: nontender; Nose: nares patent with clear rhinorrhea, Throat: oropharynx erythematous, cobblestoning present, tonsils non erythematous or enlarged, uvula midline  Neck: supple without LAD Lungs: unlabored respirations, symmetrical air entry; cough: mild; no respiratory distress; rhonchus lung sounds, diminished in bilateral bases Heart: regular rate and rhythm.  Radial pulses 2+ symmetrical bilaterally Skin: warm and dry Psychological: alert and cooperative; normal mood and  affect  LABS:  No results found for this or any previous visit (from the past 24 hour(s)).   ASSESSMENT & PLAN:  1. Viral illness   2. Exposure to COVID-19 virus   3. Fever, unspecified fever cause   4. Cough    Chest xray is negative for pneumonia today Continue supportive care at home COVID and flu testing ordered.  It will take between 2-3 days for test results. Someone will contact you regarding abnormal results.   Patient should remain in quarantine until they have received Covid results.  If negative you may resume normal activities (go back to work/school) while practicing hand hygiene, social distance, and mask wearing.  If positive, patient should remain in quarantine for at least 5 days from symptom onset AND greater than 72 hours after symptoms resolution (absence of fever without the use of fever-reducing medication and improvement in respiratory symptoms), whichever is longer Get plenty of rest and push fluids Use OTC zyrtec for nasal congestion, runny nose, and/or sore throat Use OTC flonase for nasal congestion and runny nose Use medications daily for symptom relief Use OTC medications like ibuprofen or tylenol as needed fever or pain Call or go to the ED if you have any new or worsening symptoms such as fever, worsening cough, shortness of breath, chest tightness, chest pain, turning blue, changes in mental status.  Reviewed expectations re: course of current medical issues. Questions answered. Outlined signs and symptoms indicating need for more acute intervention. Patient verbalized understanding. After Visit Summary given.         Faustino Congress, NP 09/11/20 2102

## 2020-09-10 NOTE — ED Triage Notes (Signed)
Dizzy, fever, productive cough since yesterday.

## 2020-09-10 NOTE — Discharge Instructions (Addendum)
Chest xray today shows no pneumonia  Your COVID test is pending.  You should self quarantine until the test result is back.    Take Tylenol or ibuprofen as needed for fever or discomfort.  Rest and keep yourself hydrated.    Follow-up with your primary care provider if your symptoms are not improving.

## 2020-09-12 LAB — COVID-19, FLU A+B NAA
Influenza A, NAA: NOT DETECTED
Influenza B, NAA: NOT DETECTED
SARS-CoV-2, NAA: DETECTED — AB

## 2020-09-17 DIAGNOSIS — U071 COVID-19: Secondary | ICD-10-CM | POA: Diagnosis not present

## 2020-09-21 DIAGNOSIS — E1165 Type 2 diabetes mellitus with hyperglycemia: Secondary | ICD-10-CM | POA: Diagnosis not present

## 2020-09-21 DIAGNOSIS — E7849 Other hyperlipidemia: Secondary | ICD-10-CM | POA: Diagnosis not present

## 2020-09-21 DIAGNOSIS — M1990 Unspecified osteoarthritis, unspecified site: Secondary | ICD-10-CM | POA: Diagnosis not present

## 2020-09-21 DIAGNOSIS — I10 Essential (primary) hypertension: Secondary | ICD-10-CM | POA: Diagnosis not present

## 2020-09-24 ENCOUNTER — Ambulatory Visit: Payer: Medicare Other | Admitting: Nurse Practitioner

## 2020-09-24 DIAGNOSIS — Z0001 Encounter for general adult medical examination with abnormal findings: Secondary | ICD-10-CM | POA: Diagnosis not present

## 2020-09-24 DIAGNOSIS — Z1389 Encounter for screening for other disorder: Secondary | ICD-10-CM | POA: Diagnosis not present

## 2020-09-24 DIAGNOSIS — I10 Essential (primary) hypertension: Secondary | ICD-10-CM | POA: Diagnosis not present

## 2020-09-24 DIAGNOSIS — E1165 Type 2 diabetes mellitus with hyperglycemia: Secondary | ICD-10-CM | POA: Diagnosis not present

## 2020-09-25 LAB — COMPREHENSIVE METABOLIC PANEL
Calcium: 9.1 (ref 8.7–10.7)
GFR calc Af Amer: 87
GFR calc non Af Amer: 75

## 2020-09-25 LAB — BASIC METABOLIC PANEL
BUN: 15 (ref 4–21)
Creatinine: 0.9 (ref 0.5–1.1)

## 2020-09-25 LAB — HEMOGLOBIN A1C: Hemoglobin A1C: 7.5

## 2020-09-25 LAB — LIPID PANEL
Cholesterol: 110 (ref 0–200)
HDL: 47 (ref 35–70)
LDL Cholesterol: 38
Triglycerides: 145 (ref 40–160)

## 2020-09-25 LAB — MICROALBUMIN, URINE: Microalb, Ur: 9.2

## 2020-10-01 ENCOUNTER — Other Ambulatory Visit: Payer: Self-pay

## 2020-10-01 ENCOUNTER — Encounter: Payer: Self-pay | Admitting: Nurse Practitioner

## 2020-10-01 ENCOUNTER — Ambulatory Visit: Payer: Medicare Other | Admitting: Nurse Practitioner

## 2020-10-01 VITALS — BP 132/85 | HR 78 | Ht 61.0 in | Wt 216.2 lb

## 2020-10-01 DIAGNOSIS — E782 Mixed hyperlipidemia: Secondary | ICD-10-CM

## 2020-10-01 DIAGNOSIS — E1165 Type 2 diabetes mellitus with hyperglycemia: Secondary | ICD-10-CM | POA: Diagnosis not present

## 2020-10-01 MED ORDER — METFORMIN HCL ER 500 MG PO TB24: ORAL_TABLET | ORAL | 3 refills | Status: DC

## 2020-10-01 MED ORDER — TRESIBA FLEXTOUCH 100 UNIT/ML ~~LOC~~ SOPN: 45.0000 [IU] | PEN_INJECTOR | Freq: Every day | SUBCUTANEOUS | 3 refills | Status: DC

## 2020-10-01 MED ORDER — ROSUVASTATIN CALCIUM 10 MG PO TABS: 10.0000 mg | ORAL_TABLET | Freq: Every day | ORAL | 3 refills | Status: AC

## 2020-10-01 MED ORDER — TRULICITY 1.5 MG/0.5ML ~~LOC~~ SOAJ: 1.5000 mg | SUBCUTANEOUS | 3 refills | Status: DC

## 2020-10-01 MED ORDER — LOSARTAN POTASSIUM 25 MG PO TABS: 25.0000 mg | ORAL_TABLET | Freq: Every day | ORAL | 3 refills | Status: DC

## 2020-10-01 NOTE — Patient Instructions (Signed)

## 2020-10-01 NOTE — Progress Notes (Signed)
10/01/2020, 2:15 PM  Endocrinology follow-up note   Subjective:    Patient ID: Erica Price, female    DOB: 1960-10-03.  Erica Price is being seen  in follow-up for management of currently uncontrolled symptomatic diabetes requested by  [name], MD.   Past Medical History:  Diagnosis Date  . Apnea    Poss OSA  . Arthralgia   . Arthritis   . Depression   . Diabetes mellitus without complication ([place])   . Fatigue   . Fibromyalgia   . GERD (gastroesophageal reflux disease)   . IBS (irritable bowel syndrome)   . Myalgia   . Pinched nerve    [id number]  . PONV (postoperative nausea and vomiting)   . Snoring     Past Surgical History:  Procedure Laterality Date  . ARTHROSCOPIC REPAIR ACL Left   . BIOPSY N/A 06/12/2014   Procedure: BIOPSY;  Surgeon: Danie Binder, MD;  Location: [facility];  Service: Endoscopy;  Laterality: N/A;  . BIOPSY  09/14/2017   Procedure: BIOPSY;  Surgeon: Danie Binder, MD;  Location: [facility];  Service: Endoscopy;;  colon  . CARPAL TUNNEL RELEASE Right   . CHOLECYSTECTOMY  1990s  . CLUB FOOT RELEASE Left    x3  . COLONOSCOPY WITH PROPOFOL N/A 06/12/2014   Dr. Suzette Battiest colon polyps removed/moderate divertiiculosis/small internal hemorrhoids/moderate sized external hemorrhoids. tubular adenomas. Next surveillance in Oct 2018.   Marland Kitchen COLONOSCOPY WITH PROPOFOL N/A 09/14/2017   Dr. Oneida Alar: simple adenomas, moderate diverticulosis in rectosigmoid colon, sigmoid colon, and descending colon. External and internal hemorrhoids, normal colon biopsies. simple adenomas. Needs surveillance in 3 years with Propofol, colowrap  . ESOPHAGOGASTRODUODENOSCOPY (EGD) WITH PROPOFOL N/A 06/12/2014   Dr. 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 benign gastric and duodenal biopsies  . POLYPECTOMY N/A 06/12/2014   Procedure: POLYPECTOMY;   Surgeon: Danie Binder, MD;  Location: [facility];  Service: Endoscopy;  Laterality: N/A;  . POLYPECTOMY  09/14/2017   Procedure: POLYPECTOMY;  Surgeon: Danie Binder, MD;  Location: [facility];  Service: Endoscopy;;  colon  . SAVORY DILATION N/A 06/12/2014   Procedure: SAVORY DILATION;  Surgeon: Danie Binder, MD;  Location: [facility];  Service: Endoscopy;  Laterality: N/A;  12.8-17    Social History   Socioeconomic History  . Marital status: Married    Spouse name: Marcello Moores  . Number of children: 2  . Years of education: Not on file  . Highest education level: Associate degree: occupational, Hotel manager, or vocational program  Occupational History  . Occupation: disabled  Tobacco Use  . Smoking status: Former Smoker    Packs/day: 0.50    Years: 20.00    Pack years: 10.00    Types: Cigarettes    Quit date: 10/18/2011    Years since quitting: 8.9  . Smokeless tobacco: Never Used  . Tobacco comment: quit in 2014  Vaping Use  . Vaping Use: Never used  Substance and Sexual Activity  . Alcohol use: No  . Drug use: No  . Sexual activity: Not Currently    Birth control/protection: Post-menopausal  Other Topics Concern  . Not on file  Social History Narrative   She is currently unemployed.  She was terminated on 06/15/2013.  She was previously a Loss adjuster, chartered, worked at [employer], and worked in Charity fundraiser for 25 years.   She lives with husband.  They have two children.   Social Determinants of Health   Financial Resource Strain: Not on file  Food Insecurity: Not on file  Transportation Needs: Not on file  Physical Activity: Not on file  Stress: Not on file  Social Connections: Not on file    Family History  Problem Relation Age of Onset  . Heart failure Father        Died, 78  . Hypertension Mother        Living, 4  . Breast cancer Mother   . Kidney cancer Mother   . Hypercholesterolemia Son   . Breast cancer Sister   . Pancreatic cancer Brother        passed age 32   . Hypertension Sister   . Colon cancer Neg Hx     Outpatient Encounter Medications as of 10/01/2020  Medication Sig  . albuterol (PROVENTIL HFA;VENTOLIN HFA) 108 (90 Base) MCG/ACT inhaler Inhale 2 puffs into the lungs every 6 (six) hours as needed for wheezing or shortness of breath.  Marland Kitchen aluminum chloride (DRYSOL) 20 % external solution Apply topically as needed.   . Artificial Tear Solution (SOOTHE XP) SOLN Apply 1 drop to eye daily as needed (dry eyes).  . Cholecalciferol (VITAMIN D3) 5000 units CAPS 10,000 Units daily.   Marland Kitchen dicyclomine (BENTYL) 10 MG capsule Take 1 capsule (10 mg total) by mouth 4 (four) times daily as needed.  . DULoxetine (CYMBALTA) 60 MG capsule Take 60 mg daily by mouth.   . fluticasone (FLONASE) 50 MCG/ACT nasal spray Place 2 sprays into both nostrils daily as needed for allergies or rhinitis.  . furosemide (LASIX) 20 MG tablet Take 20 mg by mouth daily as needed for fluid or edema.   . gabapentin (NEURONTIN) 300 MG capsule Take 600 mg by mouth 3 (three) times daily.  Marland Kitchen guaiFENesin-codeine 100-10 MG/5ML syrup Take 10 mLs by mouth every 6 (six) hours as needed.  . Lancets (ONETOUCH DELICA PLUS [id number]) MISC Apply topically.  Marland Kitchen NYAMYC powder   . ONETOUCH ULTRA test strip 1 each 3 (three) times daily.  . pantoprazole (PROTONIX) 40 MG tablet TAKE ONE TABLET BY MOUTH DAILY 30 MINUTES PRIOR TO BREAKFAST.  Marland Kitchen rOPINIRole (REQUIP) 0.5 MG tablet Take 0.5-1 mg by mouth at bedtime.  . SURE COMFORT PEN NEEDLES 31G X 8 MM MISC USE DAILY WITH TRESIBA.  Marland Kitchen traMADol (ULTRAM) 50 MG tablet Take 50-100 mg by mouth every 6 (six) hours as needed for moderate pain (depends on pain level if takes  1-2 tablets).  . zolpidem (AMBIEN) 10 MG tablet Take 5-10 mg by mouth at bedtime as needed for sleep (depends on insomnia if takes 0.5-1 tablet).  . [DISCONTINUED] Dulaglutide (TRULICITY) 1.5 EY/8.1KG SOPN Inject 1.5 mg into the skin once a week.  . [DISCONTINUED] insulin degludec (TRESIBA  FLEXTOUCH) 100 UNIT/ML FlexTouch Pen Inject 40 Units into the skin at bedtime.  . [DISCONTINUED] losartan (COZAAR) 25 MG tablet Take 1 tablet (25 mg total) by mouth daily.  . [DISCONTINUED] metFORMIN (GLUCOPHAGE-XR) 500 MG 24 hr tablet TAKE 1 TABLET BY MOUTH TWICE DAILY AFTER A MEAL. (Patient taking differently: Take 500 mg by mouth. Only taking at night)  . [DISCONTINUED] rosuvastatin (CRESTOR) 10 MG  tablet TAKE ONE TABLET BY MOUTH ONCE DAILY.  . Dulaglutide (TRULICITY) 1.5 WP/8.0DX SOPN Inject 1.5 mg into the skin once a week.  . insulin degludec (TRESIBA FLEXTOUCH) 100 UNIT/ML FlexTouch Pen Inject 45 Units into the skin at bedtime.  Marland Kitchen Ketorolac Tromethamine (TORADOL IJ) Inject as directed as needed. Gets at dr office (Patient not taking: Reported on 10/01/2020)  . losartan (COZAAR) 25 MG tablet Take 1 tablet (25 mg total) by mouth daily.  . metFORMIN (GLUCOPHAGE-XR) 500 MG 24 hr tablet TAKE 1 TABLET BY MOUTH TWICE DAILY AFTER A MEAL.  . rosuvastatin (CRESTOR) 10 MG tablet Take 1 tablet (10 mg total) by mouth daily.  . [DISCONTINUED] Dulaglutide (TRULICITY) 1.5 IP/3.8SN SOPN Inject 1.5 mg into the skin once a week.  . [DISCONTINUED] Dulaglutide (TRULICITY) 1.5 KN/3.9JQ SOPN Inject 1.5 mg into the skin once a week.   No facility-administered encounter medications on file as of 10/01/2020.    ALLERGIES: No Known Allergies  VACCINATION STATUS:  There is no immunization history on file for this patient.  Diabetes She presents for her follow-up diabetic visit. She has type 2 diabetes mellitus. Onset time: She was diagnosed at approximate age of 70 years. Her disease course has been worsening. There are no hypoglycemic associated symptoms. Pertinent negatives for hypoglycemia include no confusion, headaches, pallor or seizures. Associated symptoms include fatigue. Pertinent negatives for diabetes include no chest pain, no polydipsia, no polyphagia and no polyuria. There are no hypoglycemic  complications. Symptoms are stable. There are no diabetic complications. Risk factors for coronary artery disease include diabetes mellitus, obesity, sedentary lifestyle, post-menopausal, tobacco exposure, dyslipidemia and hypertension. Current diabetic treatment includes insulin injections and oral agent (monotherapy). She is compliant with treatment most of the time. Her weight is fluctuating minimally. She is following a generally unhealthy diet. When asked about meal planning, she reported none. She has not had a previous visit with a dietitian. She rarely participates in exercise. Her home blood glucose trend is fluctuating minimally. Her breakfast blood glucose range is generally 140-180 mg/dl. (She presents today, without her meter or logs to review.  She assures me she is monitoring as directed, just left them on the counter at home.  Her previsit A1c was 7.5% on 09/25/20, increasing from last visit of 6.9%.  She denies any hypoglycemia.  Says her morning numbers run between 140-150 since she had COVID last month.  Prior to that, she had a sinus infection.) An ACE inhibitor/angiotensin II receptor blocker is not being taken. She does not see a podiatrist.Eye exam is current.  Hyperlipidemia This is a chronic problem. The current episode started more than 1 year ago. The problem is controlled. Recent lipid tests were reviewed and are normal. Exacerbating diseases include chronic renal disease, diabetes and obesity. Factors aggravating her hyperlipidemia include fatty foods. Pertinent negatives include no chest pain. Current antihyperlipidemic treatment includes statins. The current treatment provides significant improvement of lipids. Compliance problems include adherence to diet and adherence to exercise.  Risk factors for coronary artery disease include diabetes mellitus, dyslipidemia, obesity, a sedentary lifestyle, post-menopausal and hypertension.  Hypertension This is a chronic problem. The current  episode started more than 1 year ago. The problem has been gradually improving since onset. The problem is controlled. Pertinent negatives include no chest pain or headaches. There are no associated agents to hypertension. Risk factors for coronary artery disease include diabetes mellitus, dyslipidemia, obesity, post-menopausal state and sedentary lifestyle. Past treatments include angiotensin blockers. The current treatment provides moderate  improvement. There are no compliance problems.  Hypertensive end-organ damage includes kidney disease. Identifiable causes of hypertension include chronic renal disease and sleep apnea.    Review of systems  Constitutional: + Minimally fluctuating body weight,  current Body mass index is 40.85 kg/m. , + fatigue, no subjective hyperthermia, no subjective hypothermia Eyes: no blurry vision, no xerophthalmia ENT: no sore throat, no nodules palpated in throat, no dysphagia/odynophagia, no hoarseness Cardiovascular: no chest pain, no shortness of breath, no palpitations, no leg swelling Respiratory: no cough, no shortness of breath Gastrointestinal: no nausea/vomiting/diarrhea Musculoskeletal: no muscle/joint aches Skin: no rashes, no hyperemia Neurological: no tremors, no numbness, no tingling, no dizziness Psychiatric: no depression, no anxiety    Objective:    BP 132/85 (BP Location: Left Arm, Patient Position: Sitting)   Pulse 78   Ht 5\' 1"  (1.549 m)   Wt 216 lb 3.2 oz (98.1 kg)   BMI 40.85 kg/m   Wt Readings from Last 3 Encounters:  10/01/20 216 lb 3.2 oz (98.1 kg)  09/10/20 212 lb (96.2 kg)  05/23/20 215 lb 12.8 oz (97.9 kg)    BP Readings from Last 3 Encounters:  10/01/20 132/85  09/10/20 129/81  05/23/20 135/83     Physical Exam- Limited  Constitutional:  Body mass index is 40.85 kg/m. , not in acute distress, normal state of mind Eyes:  EOMI, no exophthalmos Neck: Supple Cardiovascular: RRR, no murmers, rubs, or gallops, no  edema Respiratory: Adequate breathing efforts, no crackles, rales, rhonchi, or wheezing Musculoskeletal: no gross deformities, strength intact in all four extremities, no gross restriction of joint movements Skin:  no rashes, no hyperemia Neurological: no tremor with outstretched hands  Foot exam:   No rashes, ulcers, + cuts to great toe bilaterally and left 5th toe (from trimming her nails), + calluses, mild onychodystrophy.   Good pulses bilat.  Good sensation to 10 g monofilament bilat.   CMP ( most recent) CMP     Component Value Date/Time   NA 138 05/16/2020 1002   K 5.4 (H) 05/16/2020 1002   CL 100 05/16/2020 1002   CO2 25 05/16/2020 1002   GLUCOSE 167 (H) 05/16/2020 1002   GLUCOSE 185 (H) 07/27/2017 0916   BUN 15 09/25/2020 0000   CREATININE 0.9 09/25/2020 0000   CREATININE 0.97 05/16/2020 1002   CALCIUM 9.1 09/25/2020 0000   PROT 7.1 05/16/2020 1002   ALBUMIN 4.3 05/16/2020 1002   AST 15 05/16/2020 1002   ALT 19 05/16/2020 1002   ALKPHOS 62 05/16/2020 1002   BILITOT 0.4 05/16/2020 1002   GFRNONAA 75 09/25/2020 0000   GFRAA 87 09/25/2020 0000    Lipid Panel     Component Value Date/Time   CHOL 110 09/25/2020 0000   CHOL 104 05/16/2020 1002   TRIG 145 09/25/2020 0000   HDL 47 09/25/2020 0000   HDL 47 05/16/2020 1002   LDLCALC 38 09/25/2020 0000   LDLCALC 37 05/16/2020 1002   LABVLDL 20 05/16/2020 1002      Assessment & Plan:   1) Uncontrolled type 2 diabetes mellitus with hyperglycemia ([place])  - Erica Price has currently uncontrolled symptomatic type 2 DM since  61 years of age.  She presents today, without her meter or logs to review.  She assures me she is monitoring as directed, just left them on the counter at home.  Her previsit A1c was 7.5% on 09/25/20, increasing from last visit of 6.9%.  She denies any hypoglycemia.  Says her morning  numbers run between 140-150 since she had COVID last month.  Prior to that, she had a sinus infection.  -her  diabetes is complicated by obesity/sedentary life and she remains at a high risk for more acute and chronic complications which include CAD, CVA, CKD, retinopathy, and neuropathy. These are all discussed in detail with her.  - Nutritional counseling repeated at each appointment due to patients tendency to fall back in to old habits.  - The patient admits there is a room for improvement in their diet and drink choices. -  Suggestion is made for the patient to avoid simple carbohydrates from their diet including Cakes, Sweet Desserts / Pastries, Ice Cream, Soda (diet and regular), Sweet Tea, Candies, Chips, Cookies, Sweet Pastries,  Store Bought Juices, Alcohol in Excess of  1-2 drinks a day, Artificial Sweeteners, Coffee Creamer, and "Sugar-free" Products. This will help patient to have stable blood glucose profile and potentially avoid unintended weight gain.   - I encouraged the patient to switch to  unprocessed or minimally processed complex starch and increased protein intake (animal or plant source), fruits, and vegetables.   - Patient is advised to stick to a routine mealtimes to eat 3 meals  a day and avoid unnecessary snacks ( to snack only to correct hypoglycemia).  - she will be scheduled with Jearld Fenton, RDN, CDE for individualized diabetes education.  - I have approached her with the following individualized plan to manage diabetes and patient agrees:   -Based on her presenting glycemic profile, she will not need prandial insulin for now.    -Given her lack of meter or logs to review, only minor adjustment will be made to her medications today.  She is advised to increase her Tresiba to 45 units SQ nightly, continue her Metformin 500 mg ER twice daily with meals, and Trulicity 1.5 mg SQ weekly.    -She is encouraged to consistently monitor glucose twice daily, before breakfast and before bed and call the clinic if she has readings less than 70 or greater than 200 for 3 tests in a  row.  - Patient specific target  A1c;  LDL, HDL, Triglycerides,  were discussed in detail.  2) Blood Pressure /Hypertension: Her blood pressure is controlled to target.  She is advised to continue Lasix 40 mg po daily prn for fluid.  She is advised to continue Losartan 25 mg po daily.  3) Lipids/Hyperlipidemia:   Her most recent lipid panel from 09/25/20 shows controlled LDL of 38.  She is advised to continue Crestor 10 mg po daily before bed.  Side effects and precautions discussed with her.   4)  Weight/Diet:  Her Body mass index is 40.85 kg/m.-   clearly complicating her diabetes care.  She is a candidate for modest weight loss.  I discussed with her the fact that loss of 5 - 10% of her  current body weight will have the most impact on her diabetes management.  CDE Consult will be initiated . Exercise, and detailed carbohydrates information provided  -  detailed on discharge instructions.  5) Chronic Care/Health Maintenance: -she on ARB and Statin medication and is encouraged to initiate and continue to follow up with Ophthalmology, Dentist, Podiatrist at least yearly or according to recommendations, and advised to   stay away from smoking. I have recommended yearly flu vaccine and pneumonia vaccine at least every 5 years; moderate intensity exercise for up to 150 minutes weekly; and  sleep for at least 7 hours a  day.  - she is advised to maintain close follow up with [name], MD for primary care needs, as well as her other providers for optimal and coordinated care.   - Time spent on this patient care encounter:  35 min, of which > 50% was spent in  counseling and the rest reviewing her blood glucose logs , discussing her hypoglycemia and hyperglycemia episodes, reviewing her current and  previous labs / studies  ( including abstraction from other facilities) and medications  doses and developing a  long term treatment plan and documenting her care.   Please refer to Patient Instructions  for Blood Glucose Monitoring and Insulin/Medications Dosing Guide"  in media tab for additional information. Please  also refer to " Patient Self Inventory" in the Media  tab for reviewed elements of pertinent patient history.  Allena Katz participated in the discussions, expressed understanding, and voiced agreement with the above plans.  All questions were answered to her satisfaction. she is encouraged to contact clinic should she have any questions or concerns prior to her return visit.    Follow up plan: - Return in about 4 months (around 01/29/2021) for Diabetes follow up with A1c in office, No previsit labs, Bring glucometer and logs, ABI next visit.  Rayetta Pigg, [facility] [address] [place], [place] [address] Phone: [phone] Fax: [phone]  10/01/2020, 2:15 PM

## 2020-10-21 DIAGNOSIS — I10 Essential (primary) hypertension: Secondary | ICD-10-CM | POA: Diagnosis not present

## 2020-10-21 DIAGNOSIS — M1990 Unspecified osteoarthritis, unspecified site: Secondary | ICD-10-CM | POA: Diagnosis not present

## 2020-10-21 DIAGNOSIS — E1165 Type 2 diabetes mellitus with hyperglycemia: Secondary | ICD-10-CM | POA: Diagnosis not present

## 2020-10-21 DIAGNOSIS — E7849 Other hyperlipidemia: Secondary | ICD-10-CM | POA: Diagnosis not present

## 2020-11-07 ENCOUNTER — Other Ambulatory Visit: Payer: Self-pay

## 2020-11-07 ENCOUNTER — Encounter: Payer: Self-pay | Admitting: Emergency Medicine

## 2020-11-07 ENCOUNTER — Ambulatory Visit
Admission: EM | Admit: 2020-11-07 | Discharge: 2020-11-07 | Disposition: A | Discharge status: Home / Self Care | Payer: Medicare Other | Attending: Family Medicine | Admitting: Family Medicine

## 2020-11-07 DIAGNOSIS — L03313 Cellulitis of chest wall: Secondary | ICD-10-CM | POA: Diagnosis not present

## 2020-11-07 DIAGNOSIS — L02213 Cutaneous abscess of chest wall: Secondary | ICD-10-CM

## 2020-11-07 MED ORDER — CEFTRIAXONE SODIUM 1 G IJ SOLR
1.0000 g | Freq: Once | INTRAMUSCULAR | Status: AC
  Administered 2020-11-07: 1 g via INTRAMUSCULAR

## 2020-11-07 MED ORDER — HYDROCODONE-ACETAMINOPHEN 5-325 MG PO TABS: 1.0000 | ORAL_TABLET | Freq: Four times a day (QID) | ORAL | 0 refills | Status: DC | PRN

## 2020-11-07 MED ORDER — DOXYCYCLINE HYCLATE 100 MG PO CAPS: 100.0000 mg | ORAL_CAPSULE | Freq: Two times a day (BID) | ORAL | 0 refills | Status: DC

## 2020-11-07 NOTE — ED Triage Notes (Signed)
Poss abscess that is red and painful to LT side area since Sunday.  Pt had a small amount of drainage come out Sunday but has not had any since.

## 2020-11-07 NOTE — ED Provider Notes (Addendum)
[place]   [id number] 11/07/20 Arrival Time: 2482  ASSESSMENT & PLAN:  1. Cellulitis of chest wall   2. Abscess of chest wall     Incision and Drainage Procedure Note  Anesthesia: 2% plain lidocaine  Procedure Details  The procedure, risks and complications have been discussed in detail (including, but not limited to pain and bleeding) with the patient.  The skin induration was prepped and draped in the usual fashion. After adequate local anesthesia, I&D with a #11 blade was performed on the left lateral chest wall just above breast with scant purulent drainage and significant bloody drainage. Explored with hemostats without additional purulent drainage. Bleeding controlled. Packing placed..  EBL: minimal Drains: none Condition: Tolerated procedure well Complications: none.  Meds ordered this encounter  Medications  . cefTRIAXone (ROCEPHIN) injection 1 g  . doxycycline (VIBRAMYCIN) 100 MG capsule    Sig: Take 1 capsule (100 mg total) by mouth 2 (two) times daily.    Dispense:  20 capsule    Refill:  0  . HYDROcodone-acetaminophen (NORCO/VICODIN) 5-325 MG tablet    Sig: Take 1 tablet by mouth every 6 (six) hours as needed for moderate pain or severe pain.    Dispense:  12 tablet    Refill:  0     Discharge Instructions     You have had an abscess drained today and you may have had packing placed in the wound to help the abscess continue to drain at home. If packing was placed, please do not remove it until you follow up with the surgeon. I am concerned the infection may extend deeper into your skin. This is why I want you to follow up with a surgeon. You may shower with the packing in place. Let the soapy water clean your wound. Do not scrub it. Keep your wound covered as much as possible.  Proceed to the Emergency Department if you develop any of the following symptoms: fever, increasing redness or swelling around where your abscess was, increased pain, or  generalized weakness or vomiting.  Be aware, you have been prescribed pain medications that may cause drowsiness. While taking this medication, do not take any other medications containing acetaminophen (Tylenol). Do not combine with alcohol or other illicit drugs. Please do not drive, operate heavy machinery, or take part in activities that require making important decisions while on this medication as your judgement may be clouded.     Whomever can see her first, to schedule:  Follow-up Information    Schedule an appointment as soon as possible for a visit  with Aviva Signs, MD.   Specialty: General Surgery Contact information: [address] [place] [address] [phone]        Schedule an appointment as soon as possible for a visit  with Surgery, [place].   Specialty: General Surgery Contact information: [place] [address] [phone]               Reviewed expectations re: course of current medical issues. Questions answered. Outlined signs and symptoms indicating need for more acute intervention. Patient verbalized understanding. After Visit Summary given.   SUBJECTIVE:  Erica Price is a 60 y.o. female who presents with a possible infection of her lateral left chest wall above breast. Onset gradual, approximately 3 days ago without active drainage and without active bleeding. Symptoms have gradually worsened since beginning. Fever: absent. OTC/home treatment: none reported.   OBJECTIVE: General appearance: alert; no distress  L chest wall: approx 3x4 cm induration present with surrounding erythema measuring approx 10x14 cm extending over upper breast; hot and tender to touch; no active drainage or bleeding Psychological: alert and cooperative; normal mood and affect  No Known Allergies  Past Medical History:  Diagnosis Date  . Apnea    Poss OSA  . Arthralgia   . Arthritis   . Depression   . Diabetes  mellitus without complication ([place])   . Fatigue   . Fibromyalgia   . GERD (gastroesophageal reflux disease)   . IBS (irritable bowel syndrome)   . Myalgia   . Pinched nerve    lumbar-l5-s1  . PONV (postoperative nausea and vomiting)   . Snoring    Social History   Socioeconomic History  . Marital status: Married    Spouse name: Marcello Moores  . Number of children: 2  . Years of education: Not on file  . Highest education level: Associate degree: occupational, Hotel manager, or vocational program  Occupational History  . Occupation: disabled  Tobacco Use  . Smoking status: Former Smoker    Packs/day: 0.50    Years: 20.00    Pack years: 10.00    Types: Cigarettes    Quit date: 10/18/2011    Years since quitting: 9.0  . Smokeless tobacco: Never Used  . Tobacco comment: quit in 2014  Vaping Use  . Vaping Use: Never used  Substance and Sexual Activity  . Alcohol use: No  . Drug use: No  . Sexual activity: Not Currently    Birth control/protection: Post-menopausal  Other Topics Concern  . Not on file  Social History Narrative   She is currently unemployed.  She was terminated on 06/15/2013.  She was previously a Loss adjuster, chartered, worked at [employer], and worked in Charity fundraiser for 25 years.   She lives with husband.  They have two children.   Social Determinants of Health   Financial Resource Strain: Not on file  Food Insecurity: Not on file  Transportation Needs: Not on file  Physical Activity: Not on file  Stress: Not on file  Social Connections: Not on file   Family History  Problem Relation Age of Onset  . Heart failure Father        Died, 78  . Hypertension Mother        Living, 15  . Breast cancer Mother   . Kidney cancer Mother   . Hypercholesterolemia Son   . Breast cancer Sister   . Pancreatic cancer Brother        passed age 20  . Hypertension Sister   . Colon cancer Neg Hx    Past Surgical History:  Procedure Laterality Date  . ARTHROSCOPIC REPAIR ACL Left    . BIOPSY N/A 06/12/2014   Procedure: BIOPSY;  Surgeon: Danie Binder, MD;  Location: [facility];  Service: Endoscopy;  Laterality: N/A;  . BIOPSY  09/14/2017   Procedure: BIOPSY;  Surgeon: Danie Binder, MD;  Location[facility];  Service: Endoscopy;;  colon  . CARPAL TUNNEL RELEASE Right   . CHOLECYSTECTOMY  1990s  . CLUB FOOT RELEASE Left    x3  . COLONOSCOPY WITH PROPOFOL N/A 06/12/2014   Dr. Suzette Battiest colon polyps removed/moderate divertiiculosis/small internal hemorrhoids/moderate sized external hemorrhoids. tubular adenomas. Next surveillance in Oct 2018.   Marland Kitchen COLONOSCOPY WITH PROPOFOL N/A 09/14/2017   Dr. Oneida Alar: simple adenomas, moderate diverticulosis in rectosigmoid colon, sigmoid colon, and descending colon. External and internal hemorrhoids, normal colon biopsies. simple adenomas. Needs  surveillance in 3 years with Propofol, colowrap  . ESOPHAGOGASTRODUODENOSCOPY (EGD) WITH PROPOFOL N/A 06/12/2014   Dr. 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 benign gastric and duodenal biopsies  . POLYPECTOMY N/A 06/12/2014   Procedure: POLYPECTOMY;  Surgeon: Danie Binder, MD;  Location: [facility];  Service: Endoscopy;  Laterality: N/A;  . POLYPECTOMY  09/14/2017   Procedure: POLYPECTOMY;  Surgeon: Danie Binder, MD;  [facility];  Service: Endoscopy;;  colon  . SAVORY DILATION N/A 06/12/2014   Procedure: SAVORY DILATION;  Surgeon: Danie Binder, MD;  Location: [facility];  Service: Endoscopy;  Laterality: N/A;  12.8-17           Vanessa Kick, MD 11/07/20 1531    Vanessa Kick, MD 11/07/20 [phone]

## 2020-11-07 NOTE — Discharge Instructions (Addendum)
You have had an abscess drained today and you may have had packing placed in the wound to help the abscess continue to drain at home. If packing was placed, please do not remove it until you follow up with the surgeon. I am concerned the infection may extend deeper into your skin. This is why I want you to follow up with a surgeon. You may shower with the packing in place. Let the soapy water clean your wound. Do not scrub it. Keep your wound covered as much as possible.  Proceed to the Emergency Department if you develop any of the following symptoms: fever, increasing redness or swelling around where your abscess was, increased pain, or generalized weakness or vomiting.  Be aware, you have been prescribed pain medications that may cause drowsiness. While taking this medication, do not take any other medications containing acetaminophen (Tylenol). Do not combine with alcohol or other illicit drugs. Please do not drive, operate heavy machinery, or take part in activities that require making important decisions while on this medication as your judgement may be clouded.

## 2020-11-12 ENCOUNTER — Encounter: Payer: Self-pay | Admitting: General Surgery

## 2020-11-12 ENCOUNTER — Other Ambulatory Visit: Payer: Self-pay

## 2020-11-12 ENCOUNTER — Ambulatory Visit: Payer: Medicare Other | Admitting: General Surgery

## 2020-11-12 VITALS — BP 130/81 | HR 83 | Temp 98.1°F | Resp 18 | Ht 62.0 in | Wt 209.0 lb

## 2020-11-12 DIAGNOSIS — L02412 Cutaneous abscess of left axilla: Secondary | ICD-10-CM | POA: Diagnosis not present

## 2020-11-12 NOTE — Patient Instructions (Signed)
Clean wound with soap and water, then Q-tip with peroxide daily.

## 2020-11-13 NOTE — Progress Notes (Signed)
Erica Price; [id number]; May 24, 1961   HPI Patient is a 60 year old white female who was referred to my care by Dr. Hilma Favors for follow-up of a left axillary abscess.  She underwent incision and packing of the left axillary abscess late last week.  She was started on antibiotic.  She has since been changing the dressing but has not changed the packing.  She denies any fever chills.  She apparently has a history of intermittent skin abscesses in the past. Past Medical History:  Diagnosis Date  . Apnea    Poss OSA  . Arthralgia   . Arthritis   . Depression   . Diabetes mellitus without complication ([place])   . Fatigue   . Fibromyalgia   . GERD (gastroesophageal reflux disease)   . IBS (irritable bowel syndrome)   . Myalgia   . Pinched nerve    lumbar-l5-s1  . PONV (postoperative nausea and vomiting)   . Snoring     Past Surgical History:  Procedure Laterality Date  . ARTHROSCOPIC REPAIR ACL Left   . BIOPSY N/A 06/12/2014   Procedure: BIOPSY;  Surgeon: Danie Binder, MD;  Location: [facility];  Service: Endoscopy;  Laterality: N/A;  . BIOPSY  09/14/2017   Procedure: BIOPSY;  Surgeon: Danie Binder, MD;  [facility];  Service: Endoscopy;;  colon  . CARPAL TUNNEL RELEASE Right   . CHOLECYSTECTOMY  1990s  . CLUB FOOT RELEASE Left    x3  . COLONOSCOPY WITH PROPOFOL N/A 06/12/2014   Dr. Suzette Battiest colon polyps removed/moderate divertiiculosis/small internal hemorrhoids/moderate sized external hemorrhoids. tubular adenomas. Next surveillance in Oct 2018.   Marland Kitchen COLONOSCOPY WITH PROPOFOL N/A 09/14/2017   Dr. Oneida Alar: simple adenomas, moderate diverticulosis in rectosigmoid colon, sigmoid colon, and descending colon. External and internal hemorrhoids, normal colon biopsies. simple adenomas. Needs surveillance in 3 years with Propofol, colowrap  . ESOPHAGOGASTRODUODENOSCOPY (EGD) WITH PROPOFOL N/A 06/12/2014   Dr. Barnie Alderman non-erosive gastritis, stricture at GE junction s/p savary  dilation. benign gastric and duodenal biopsies  . POLYPECTOMY N/A 06/12/2014   Procedure: POLYPECTOMY;  Surgeon: Danie Binder, MD;  Location: [facility];  Service: Endoscopy;  Laterality: N/A;  . POLYPECTOMY  09/14/2017   Procedure: POLYPECTOMY;  Surgeon: Danie Binder, MD;  [facility];  Service: Endoscopy;;  colon  . SAVORY DILATION N/A 06/12/2014   Procedure: SAVORY DILATION;  Surgeon: Danie Binder, MD;  Location: [facility];  Service: Endoscopy;  Laterality: N/A;  12.8-17    Family History  Problem Relation Age of Onset  . Heart failure Father        Died, 78  . Hypertension Mother        Living, 42  . Breast cancer Mother   . Kidney cancer Mother   . Hypercholesterolemia Son   . Breast cancer Sister   . Pancreatic cancer Brother        passed age 28  . Hypertension Sister   . Colon cancer Neg Hx     Current Outpatient Medications on File Prior to Visit  Medication Sig Dispense Refill  . albuterol (PROVENTIL HFA;VENTOLIN HFA) 108 (90 Base) MCG/ACT inhaler Inhale 2 puffs into the lungs every 6 (six) hours as needed for wheezing or shortness of breath.    Marland Kitchen aluminum chloride (DRYSOL) 20 % external solution Apply topically as needed.     . Artificial Tear Solution (SOOTHE XP) SOLN Apply 1 drop to eye daily as needed (dry eyes).    . Cholecalciferol (  VITAMIN D3) 5000 units CAPS 10,000 Units daily.     Marland Kitchen dicyclomine (BENTYL) 10 MG capsule Take 1 capsule (10 mg total) by mouth 4 (four) times daily as needed. 180 capsule 1  . doxycycline (VIBRAMYCIN) 100 MG capsule Take 1 capsule (100 mg total) by mouth 2 (two) times daily. 20 capsule 0  . Dulaglutide (TRULICITY) 1.5 NO/7.0JG SOPN Inject 1.5 mg into the skin once a week. 8 mL 3  . DULoxetine (CYMBALTA) 60 MG capsule Take 60 mg daily by mouth.     . fluticasone (FLONASE) 50 MCG/ACT nasal spray Place 2 sprays into both nostrils daily as needed for allergies or rhinitis.    . furosemide (LASIX) 20 MG tablet Take 20 mg by mouth  daily as needed for fluid or edema.     . gabapentin (NEURONTIN) 300 MG capsule Take 600 mg by mouth 3 (three) times daily.    Marland Kitchen HYDROcodone-acetaminophen (NORCO/VICODIN) 5-325 MG tablet Take 1 tablet by mouth every 6 (six) hours as needed for moderate pain or severe pain. 12 tablet 0  . insulin degludec (TRESIBA FLEXTOUCH) 100 UNIT/ML FlexTouch Pen Inject 45 Units into the skin at bedtime. 36 mL 3  . Ketorolac Tromethamine (TORADOL IJ) Inject as directed as needed. Gets at dr office    . Lancets (ONETOUCH DELICA PLUS [id number]) MISC Apply topically.    Marland Kitchen losartan (COZAAR) 25 MG tablet Take 1 tablet (25 mg total) by mouth daily. 90 tablet 3  . metFORMIN (GLUCOPHAGE-XR) 500 MG 24 hr tablet TAKE 1 TABLET BY MOUTH TWICE DAILY AFTER A MEAL. 180 tablet 3  . NYAMYC powder     . ONETOUCH ULTRA test strip 1 each 3 (three) times daily.    . pantoprazole (PROTONIX) 40 MG tablet TAKE ONE TABLET BY MOUTH DAILY 30 MINUTES PRIOR TO BREAKFAST. 90 tablet 3  . rOPINIRole (REQUIP) 0.5 MG tablet Take 0.5-1 mg by mouth at bedtime.    . rosuvastatin (CRESTOR) 10 MG tablet Take 1 tablet (10 mg total) by mouth daily. 90 tablet 3  . SURE COMFORT PEN NEEDLES 31G X 8 MM MISC USE DAILY WITH TRESIBA. 100 each 1  . traMADol (ULTRAM) 50 MG tablet Take 50-100 mg by mouth every 6 (six) hours as needed for moderate pain (depends on pain level if takes  1-2 tablets).    . zolpidem (AMBIEN) 10 MG tablet Take 5-10 mg by mouth at bedtime as needed for sleep (depends on insomnia if takes 0.5-1 tablet).     No current facility-administered medications on file prior to visit.    No Known Allergies  Social History   Substance and Sexual Activity  Alcohol Use No    Social History   Tobacco Use  Smoking Status Former Smoker  . Packs/day: 0.50  . Years: 20.00  . Pack years: 10.00  . Types: Cigarettes  . Quit date: 10/18/2011  . Years since quitting: 9.0  Smokeless Tobacco Never Used  Tobacco Comment   quit in 2014     Review of Systems  Constitutional: Positive for malaise/fatigue.  HENT: Positive for sinus pain.   Eyes: Positive for blurred vision and pain.  Respiratory: Positive for shortness of breath.   Cardiovascular: Negative.   Gastrointestinal: Negative.   Genitourinary: Negative.   Musculoskeletal: Positive for back pain, joint pain and neck pain.  Neurological: Positive for dizziness and headaches.  Endo/Heme/Allergies: Negative.   Psychiatric/Behavioral: Negative.     Objective   Vitals:   11/12/20 1357  BP: 130/81  Pulse: 83  Resp: 18  Temp: 98.1 F (36.7 C)  SpO2: 94%    Physical Exam Vitals reviewed.  Constitutional:      Appearance: Normal appearance.  HENT:     Head: Normocephalic and atraumatic.  Cardiovascular:     Rate and Rhythm: Normal rate and regular rhythm.     Heart sounds: Normal heart sounds. No murmur heard. No friction rub. No gallop.   Pulmonary:     Effort: Pulmonary effort is normal. No respiratory distress.     Breath sounds: Normal breath sounds. No stridor. No wheezing, rhonchi or rales.  Skin:    General: Skin is warm and dry.     Comments: Small 1/2 to 2 cm incision noted in the left axilla.  Packing was removed.  Minimal purulent drainage was noted and it was confined mostly to the packing.  Minimal induration at the base.  Good granulation tissue present.  No loculations present.  No significant involvement of the left breast.  Neurological:     Mental Status: She is alert and oriented to person, place, and time.     Assessment  Abscess, left axilla, resolving Plan   I told the family to keep the wound clean and dry with soap and water and apply a Q-tip with peroxide daily.  No need for any further antibiotic therapy.  Do not repack.  We will see her again in 1 weeks for follow-up.

## 2020-11-18 ENCOUNTER — Telehealth: Payer: Self-pay | Admitting: *Deleted

## 2020-11-18 ENCOUNTER — Other Ambulatory Visit: Payer: Self-pay

## 2020-11-18 ENCOUNTER — Other Ambulatory Visit: Payer: Self-pay | Admitting: Gastroenterology

## 2020-11-18 DIAGNOSIS — R197 Diarrhea, unspecified: Secondary | ICD-10-CM

## 2020-11-18 MED ORDER — DICYCLOMINE HCL 10 MG PO CAPS: 10.0000 mg | ORAL_CAPSULE | Freq: Four times a day (QID) | ORAL | 1 refills | Status: DC | PRN

## 2020-11-18 MED ORDER — LOSARTAN POTASSIUM 25 MG PO TABS: 25.0000 mg | ORAL_TABLET | Freq: Every day | ORAL | 0 refills | Status: DC

## 2020-11-18 MED ORDER — METFORMIN HCL ER 500 MG PO TB24: ORAL_TABLET | ORAL | 0 refills | Status: DC

## 2020-11-18 MED ORDER — TRESIBA FLEXTOUCH 100 UNIT/ML ~~LOC~~ SOPN: 45.0000 [IU] | PEN_INJECTOR | Freq: Every day | SUBCUTANEOUS | 1 refills | Status: DC

## 2020-11-18 NOTE — Progress Notes (Signed)
Rx was originally sent to [facility]. Called an cancelled Rx at [facility] and re-sent Rx to [facility].

## 2020-11-18 NOTE — Telephone Encounter (Signed)
Received refill request for dicyclomine 20mg  1 tablet QID prn diarrhea sent to [facility]

## 2020-11-18 NOTE — Telephone Encounter (Signed)
Rx sent for Bentyl 10 mg daily QID as this was her original Rx.

## 2020-11-19 ENCOUNTER — Encounter: Payer: Self-pay | Admitting: General Surgery

## 2020-11-19 ENCOUNTER — Ambulatory Visit: Payer: Medicare Other | Admitting: General Surgery

## 2020-11-19 ENCOUNTER — Other Ambulatory Visit: Payer: Self-pay

## 2020-11-19 VITALS — BP 117/77 | HR 96 | Temp 97.2°F | Resp 16 | Ht 62.0 in | Wt 207.0 lb

## 2020-11-19 DIAGNOSIS — L02412 Cutaneous abscess of left axilla: Secondary | ICD-10-CM

## 2020-11-19 NOTE — Progress Notes (Signed)
Subjective:     Erica Price  Here for follow-up wound check of left axillary abscess.  Patient states the wound has healed nicely.  Minimal induration is noted deep.  No purulent drainage is noted.  She is finishing her antibiotic tomorrow. Objective:    BP 117/77   Pulse 96   Temp (!) 97.2 F (36.2 C) (Other (Comment))   Resp 16   Ht 5\' 2"  (1.575 m)   Wt 207 lb (93.9 kg)   SpO2 96%   BMI 37.86 kg/m   General:  alert, cooperative and no distress  Left axillary wound has healed.  Minimal induration is noted deep.  No erythema is noted.  No drainage is noted.     Assessment:    Left axillary abscess, status post incision and drainage in outside facility, healed    Plan:   Follow-up here as needed.

## 2020-11-20 DIAGNOSIS — E1165 Type 2 diabetes mellitus with hyperglycemia: Secondary | ICD-10-CM | POA: Diagnosis not present

## 2020-11-20 DIAGNOSIS — I10 Essential (primary) hypertension: Secondary | ICD-10-CM | POA: Diagnosis not present

## 2020-11-20 DIAGNOSIS — M1990 Unspecified osteoarthritis, unspecified site: Secondary | ICD-10-CM | POA: Diagnosis not present

## 2020-11-20 DIAGNOSIS — E7849 Other hyperlipidemia: Secondary | ICD-10-CM | POA: Diagnosis not present

## 2020-11-26 ENCOUNTER — Telehealth: Payer: Self-pay | Admitting: Internal Medicine

## 2020-11-26 MED ORDER — PANTOPRAZOLE SODIUM 40 MG PO TBEC: DELAYED_RELEASE_TABLET | ORAL | 3 refills | Status: DC

## 2020-11-26 NOTE — Telephone Encounter (Signed)
Pt requesting Rx sent to [facility] @ [phone] Fax --- and their phone number is [phone]. Her Rx's normally goes to [place] but I'm seeing where some are being sent to this pharmacy.

## 2020-11-26 NOTE — Addendum Note (Signed)
Addended by: Annitta Needs on: 11/26/2020 04:12 PM   Modules accepted: Orders

## 2020-11-26 NOTE — Telephone Encounter (Signed)
PATIENT NEEDS PANTOPROZOLE PRESCRIPTION SENT TO [facility] [phone]  FAX  [phone]

## 2020-11-26 NOTE — Telephone Encounter (Signed)
Completed.

## 2020-12-21 DIAGNOSIS — E7849 Other hyperlipidemia: Secondary | ICD-10-CM | POA: Diagnosis not present

## 2020-12-21 DIAGNOSIS — E1165 Type 2 diabetes mellitus with hyperglycemia: Secondary | ICD-10-CM | POA: Diagnosis not present

## 2020-12-21 DIAGNOSIS — I10 Essential (primary) hypertension: Secondary | ICD-10-CM | POA: Diagnosis not present

## 2020-12-21 DIAGNOSIS — M1990 Unspecified osteoarthritis, unspecified site: Secondary | ICD-10-CM | POA: Diagnosis not present

## 2021-01-09 DIAGNOSIS — B9789 Other viral agents as the cause of diseases classified elsewhere: Secondary | ICD-10-CM | POA: Diagnosis not present

## 2021-01-09 DIAGNOSIS — Z681 Body mass index (BMI) 19 or less, adult: Secondary | ICD-10-CM | POA: Diagnosis not present

## 2021-01-21 DIAGNOSIS — G894 Chronic pain syndrome: Secondary | ICD-10-CM | POA: Diagnosis not present

## 2021-01-21 DIAGNOSIS — E118 Type 2 diabetes mellitus with unspecified complications: Secondary | ICD-10-CM | POA: Diagnosis not present

## 2021-01-21 DIAGNOSIS — M797 Fibromyalgia: Secondary | ICD-10-CM | POA: Diagnosis not present

## 2021-01-21 DIAGNOSIS — M1990 Unspecified osteoarthritis, unspecified site: Secondary | ICD-10-CM | POA: Diagnosis not present

## 2021-01-21 DIAGNOSIS — E7849 Other hyperlipidemia: Secondary | ICD-10-CM | POA: Diagnosis not present

## 2021-01-21 DIAGNOSIS — E1165 Type 2 diabetes mellitus with hyperglycemia: Secondary | ICD-10-CM | POA: Diagnosis not present

## 2021-01-21 DIAGNOSIS — I10 Essential (primary) hypertension: Secondary | ICD-10-CM | POA: Diagnosis not present

## 2021-01-21 LAB — HEMOGLOBIN A1C: Hemoglobin A1C: 6.7

## 2021-01-29 ENCOUNTER — Ambulatory Visit: Payer: Medicare Other | Admitting: Nurse Practitioner

## 2021-01-29 ENCOUNTER — Encounter: Payer: Self-pay | Admitting: Nurse Practitioner

## 2021-01-29 ENCOUNTER — Other Ambulatory Visit: Payer: Self-pay

## 2021-01-29 VITALS — BP 150/60 | HR 86 | Ht 62.0 in | Wt 206.0 lb

## 2021-01-29 DIAGNOSIS — E1165 Type 2 diabetes mellitus with hyperglycemia: Secondary | ICD-10-CM | POA: Diagnosis not present

## 2021-01-29 DIAGNOSIS — E782 Mixed hyperlipidemia: Secondary | ICD-10-CM

## 2021-01-29 MED ORDER — TRULICITY 4.5 MG/0.5ML ~~LOC~~ SOAJ: 4.5000 mg | SUBCUTANEOUS | Status: DC

## 2021-01-29 MED ORDER — TRESIBA FLEXTOUCH 100 UNIT/ML ~~LOC~~ SOPN: 45.0000 [IU] | PEN_INJECTOR | Freq: Every day | SUBCUTANEOUS | 3 refills | Status: DC

## 2021-01-29 NOTE — Patient Instructions (Signed)

## 2021-01-29 NOTE — Progress Notes (Signed)
01/29/2021, 1:42 PM  Endocrinology follow-up note   Subjective:    Patient ID: Erica Price, female    DOB: 07-16-1961.  Erica Price is being seen  in follow-up for management of currently uncontrolled symptomatic diabetes requested by  [name], MD.   Past Medical History:  Diagnosis Date  . Apnea    Poss OSA  . Arthralgia   . Arthritis   . Depression   . Diabetes mellitus without complication ([place])   . Fatigue   . Fibromyalgia   . GERD (gastroesophageal reflux disease)   . IBS (irritable bowel syndrome)   . Myalgia   . Pinched nerve    [id number]  . PONV (postoperative nausea and vomiting)   . Snoring     Past Surgical History:  Procedure Laterality Date  . ARTHROSCOPIC REPAIR ACL Left   . BIOPSY N/A 06/12/2014   Procedure: BIOPSY;  Surgeon: Danie Binder, MD;  Location: [facility];  Service: Endoscopy;  Laterality: N/A;  . BIOPSY  09/14/2017   Procedure: BIOPSY;  Surgeon: Danie Binder, MD;  Location: [facility];  Service: Endoscopy;;  colon  . CARPAL TUNNEL RELEASE Right   . CHOLECYSTECTOMY  1990s  . CLUB FOOT RELEASE Left    x3  . COLONOSCOPY WITH PROPOFOL N/A 06/12/2014   Dr. Suzette Battiest colon polyps removed/moderate divertiiculosis/small internal hemorrhoids/moderate sized external hemorrhoids. tubular adenomas. Next surveillance in Oct 2018.   Marland Kitchen COLONOSCOPY WITH PROPOFOL N/A 09/14/2017   Dr. Oneida Alar: simple adenomas, moderate diverticulosis in rectosigmoid colon, sigmoid colon, and descending colon. External and internal hemorrhoids, normal colon biopsies. simple adenomas. Needs surveillance in 3 years with Propofol, colowrap  . ESOPHAGOGASTRODUODENOSCOPY (EGD) WITH PROPOFOL N/A 06/12/2014   Dr. 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 benign gastric and duodenal biopsies  . POLYPECTOMY N/A 06/12/2014   Procedure: POLYPECTOMY;   Surgeon: Danie Binder, MD;  Location: [facility];  Service: Endoscopy;  Laterality: N/A;  . POLYPECTOMY  09/14/2017   Procedure: POLYPECTOMY;  Surgeon: Danie Binder, MD;  Location: [facility];  Service: Endoscopy;;  colon  . SAVORY DILATION N/A 06/12/2014   Procedure: SAVORY DILATION;  Surgeon: Danie Binder, MD;  Location: [facility];  Service: Endoscopy;  Laterality: N/A;  12.8-17    Social History   Socioeconomic History  . Marital status: Married    Spouse name: Marcello Moores  . Number of children: 2  . Years of education: Not on file  . Highest education level: Associate degree: occupational, Hotel manager, or vocational program  Occupational History  . Occupation: disabled  Tobacco Use  . Smoking status: Former Smoker    Packs/day: 0.50    Years: 20.00    Pack years: 10.00    Types: Cigarettes    Quit date: 10/18/2011    Years since quitting: 9.2  . Smokeless tobacco: Never Used  . Tobacco comment: quit in 2014  Vaping Use  . Vaping Use: Never used  Substance and Sexual Activity  . Alcohol use: No  . Drug use: No  . Sexual activity: Not Currently    Birth control/protection: Post-menopausal  Other Topics Concern  . Not on file  Social History Narrative   She is currently unemployed.  She was terminated on 06/15/2013.  She was previously a Loss adjuster, chartered, worked at [employer], and worked in Charity fundraiser for 25 years.   She lives with husband.  They have two children.   Social Determinants of Health   Financial Resource Strain: Not on file  Food Insecurity: Not on file  Transportation Needs: Not on file  Physical Activity: Not on file  Stress: Not on file  Social Connections: Not on file    Family History  Problem Relation Age of Onset  . Heart failure Father        Died, 78  . Hypertension Mother        Living, 40  . Breast cancer Mother   . Kidney cancer Mother   . Hypercholesterolemia Son   . Breast cancer Sister   . Pancreatic cancer Brother        passed age 25   . Hypertension Sister   . Colon cancer Neg Hx     Outpatient Encounter Medications as of 01/29/2021  Medication Sig  . Dulaglutide (TRULICITY) 4.5 TD/1.7OH SOPN Inject 4.5 mg as directed once a week.  Marland Kitchen albuterol (PROVENTIL HFA;VENTOLIN HFA) 108 (90 Base) MCG/ACT inhaler Inhale 2 puffs into the lungs every 6 (six) hours as needed for wheezing or shortness of breath.  Marland Kitchen aluminum chloride (DRYSOL) 20 % external solution Apply topically as needed.   . Artificial Tear Solution (SOOTHE XP) SOLN Apply 1 drop to eye daily as needed (dry eyes).  . celecoxib (CELEBREX) 200 MG capsule Take 200 mg by mouth daily as needed.  . Cholecalciferol (VITAMIN D3) 5000 units CAPS 10,000 Units daily.   Marland Kitchen dicyclomine (BENTYL) 10 MG capsule Take 1 capsule (10 mg total) by mouth 4 (four) times daily as needed.  . DULoxetine (CYMBALTA) 60 MG capsule Take 60 mg daily by mouth.   . fluticasone (FLONASE) 50 MCG/ACT nasal spray Place 2 sprays into both nostrils daily as needed for allergies or rhinitis.  . furosemide (LASIX) 20 MG tablet Take 20 mg by mouth daily as needed for fluid or edema.   . gabapentin (NEURONTIN) 300 MG capsule Take 600 mg by mouth 3 (three) times daily.  . insulin degludec (TRESIBA FLEXTOUCH) 100 UNIT/ML FlexTouch Pen Inject 45 Units into the skin at bedtime.  Marland Kitchen Ketorolac Tromethamine (TORADOL IJ) Inject as directed as needed. Gets at dr office  . Lancets (ONETOUCH DELICA PLUS [id number]) MISC Apply topically.  Marland Kitchen losartan (COZAAR) 25 MG tablet Take 1 tablet (25 mg total) by mouth daily.  Marland Kitchen NYAMYC powder   . ONETOUCH ULTRA test strip 1 each 3 (three) times daily.  . pantoprazole (PROTONIX) 40 MG tablet TAKE ONE TABLET BY MOUTH DAILY 30 MINUTES PRIOR TO BREAKFAST.  Marland Kitchen rOPINIRole (REQUIP) 0.5 MG tablet Take 0.5-1 mg by mouth at bedtime.  . rosuvastatin (CRESTOR) 10 MG tablet Take 1 tablet (10 mg total) by mouth daily.  . SURE COMFORT PEN NEEDLES 31G X 8 MM MISC USE DAILY WITH TRESIBA.  Marland Kitchen traMADol  (ULTRAM) 50 MG tablet Take 50-100 mg by mouth every 6 (six) hours as needed for moderate pain (depends on pain level if takes  1-2 tablets).  . zolpidem (AMBIEN) 10 MG tablet Take 5-10 mg by mouth at bedtime as needed for sleep (depends on insomnia if takes 0.5-1 tablet).  . [DISCONTINUED] doxycycline (VIBRAMYCIN) 100 MG capsule Take 1 capsule (100 mg total)  by mouth 2 (two) times daily.  . [DISCONTINUED] Dulaglutide (TRULICITY) 1.5 TO/6.7TI SOPN Inject 1.5 mg into the skin once a week.  . [DISCONTINUED] HYDROcodone-acetaminophen (NORCO/VICODIN) 5-325 MG tablet Take 1 tablet by mouth every 6 (six) hours as needed for moderate pain or severe pain.  . [DISCONTINUED] insulin degludec (TRESIBA FLEXTOUCH) 100 UNIT/ML FlexTouch Pen Inject 45 Units into the skin at bedtime.  . [DISCONTINUED] metFORMIN (GLUCOPHAGE-XR) 500 MG 24 hr tablet TAKE 1 TABLET BY MOUTH TWICE DAILY AFTER A MEAL. (Patient not taking: Reported on 01/29/2021)   No facility-administered encounter medications on file as of 01/29/2021.    ALLERGIES: No Known Allergies  VACCINATION STATUS:  There is no immunization history on file for this patient.  Diabetes She presents for her follow-up diabetic visit. She has type 2 diabetes mellitus. Onset time: She was diagnosed at approximate age of 23 years. Her disease course has been improving. There are no hypoglycemic associated symptoms. Pertinent negatives for hypoglycemia include no confusion, headaches, pallor or seizures. Associated symptoms include fatigue. Pertinent negatives for diabetes include no chest pain, no polydipsia, no polyphagia and no polyuria. There are no hypoglycemic complications. Symptoms are improving. There are no diabetic complications. Risk factors for coronary artery disease include diabetes mellitus, obesity, sedentary lifestyle, post-menopausal, tobacco exposure, dyslipidemia and hypertension. Current diabetic treatment includes insulin injections and oral agent  (monotherapy). She is compliant with treatment most of the time. Her weight is fluctuating minimally. She is following a generally unhealthy diet. When asked about meal planning, she reported none. She has not had a previous visit with a dietitian. She rarely participates in exercise. Her home blood glucose trend is fluctuating minimally. Her overall blood glucose range is 140-180 mg/dl. (She presents today with her meter, no logs, showing increasing glucose readings over the past several weeks. Her previsit A1c (checked at her PCP office on 5/31) was 6.7%, improving from last visit of 7.5%.  She stopped taking her Metformin as it was causing her GI upset, which is when her glucose started to increase slightly.  There are no significant episodes of hypoglycemia reported.) An ACE inhibitor/angiotensin II receptor blocker is not being taken. She does not see a podiatrist.Eye exam is current.  Hyperlipidemia This is a chronic problem. The current episode started more than 1 year ago. The problem is controlled. Recent lipid tests were reviewed and are normal. Exacerbating diseases include chronic renal disease, diabetes and obesity. Factors aggravating her hyperlipidemia include fatty foods. Pertinent negatives include no chest pain. Current antihyperlipidemic treatment includes statins. The current treatment provides significant improvement of lipids. Compliance problems include adherence to exercise and adherence to diet.  Risk factors for coronary artery disease include diabetes mellitus, dyslipidemia, obesity, a sedentary lifestyle, post-menopausal and hypertension.  Hypertension This is a chronic problem. The current episode started more than 1 year ago. The problem has been gradually improving since onset. The problem is controlled. Pertinent negatives include no chest pain or headaches. There are no associated agents to hypertension. Risk factors for coronary artery disease include diabetes mellitus,  dyslipidemia, obesity, post-menopausal state and sedentary lifestyle. Past treatments include angiotensin blockers and diuretics. The current treatment provides moderate improvement. There are no compliance problems.  Hypertensive end-organ damage includes kidney disease. Identifiable causes of hypertension include chronic renal disease and sleep apnea.    Review of systems  Constitutional: + Minimally fluctuating body weight,  current Body mass index is 37.68 kg/m. , no fatigue, no subjective hyperthermia, no subjective hypothermia Eyes: no blurry  vision, no xerophthalmia ENT: no sore throat, no nodules palpated in throat, no dysphagia/odynophagia, no hoarseness Cardiovascular: no chest pain, no shortness of breath, no palpitations, no leg swelling Respiratory: no cough, no shortness of breath Gastrointestinal: no nausea/vomiting/diarrhea Musculoskeletal: no muscle/joint aches Skin: no rashes, no hyperemia Neurological: no tremors, no numbness, no tingling, no dizziness Psychiatric: no depression, no anxiety    Objective:    BP (!) 150/60   Pulse 86   Ht 5\' 2"  (1.575 m)   Wt 206 lb (93.4 kg)   BMI 37.68 kg/m   Wt Readings from Last 3 Encounters:  01/29/21 206 lb (93.4 kg)  11/19/20 207 lb (93.9 kg)  11/12/20 209 lb (94.8 kg)    BP Readings from Last 3 Encounters:  01/29/21 (!) 150/60  11/19/20 117/77  11/12/20 130/81      Physical Exam- Limited  Constitutional:  Body mass index is 37.68 kg/m. , not in acute distress, normal state of mind Eyes:  EOMI, no exophthalmos Neck: Supple Cardiovascular: RRR, no murmurs, rubs, or gallops, no edema Respiratory: Adequate breathing efforts, no crackles, rales, rhonchi, or wheezing Musculoskeletal: no gross deformities, strength intact in all four extremities, no gross restriction of joint movements Skin:  no rashes, no hyperemia Neurological: no tremor with outstretched hands   POCT ABI Results 01/29/21   Right ABI:  1.23       Left ABI:  1.09  Right leg systolic / diastolic: 408/14 mmHg Left leg systolic / diastolic: 481/856 mmHg  Arm systolic / diastolic: 314/97 mmHG  Detailed report will be scanned into patient chart.   CMP ( most recent) CMP     Component Value Date/Time   NA 138 05/16/2020 1002   K 5.4 (H) 05/16/2020 1002   CL 100 05/16/2020 1002   CO2 25 05/16/2020 1002   GLUCOSE 167 (H) 05/16/2020 1002   GLUCOSE 185 (H) 07/27/2017 0916   BUN 15 09/25/2020 0000   CREATININE 0.9 09/25/2020 0000   CREATININE 0.97 05/16/2020 1002   CALCIUM 9.1 09/25/2020 0000   PROT 7.1 05/16/2020 1002   ALBUMIN 4.3 05/16/2020 1002   AST 15 05/16/2020 1002   ALT 19 05/16/2020 1002   ALKPHOS 62 05/16/2020 1002   BILITOT 0.4 05/16/2020 1002   GFRNONAA 75 09/25/2020 0000   GFRAA 87 09/25/2020 0000    Lipid Panel     Component Value Date/Time   CHOL 110 09/25/2020 0000   CHOL 104 05/16/2020 1002   TRIG 145 09/25/2020 0000   HDL 47 09/25/2020 0000   HDL 47 05/16/2020 1002   LDLCALC 38 09/25/2020 0000   LDLCALC 37 05/16/2020 1002   LABVLDL 20 05/16/2020 1002      Assessment & Plan:   1) Uncontrolled type 2 diabetes mellitus with hyperglycemia ([place])  - Erica Price has currently uncontrolled symptomatic type 2 DM since 60 years of age.  She presents today with her meter, no logs, showing increasing glucose readings over the past several weeks. Her previsit A1c (checked at her PCP office on 5/31) was 6.7%, improving from last visit of 7.5%.  She stopped taking her Metformin as it was causing her GI upset, which is when her glucose started to increase slightly.  There are no significant episodes of hypoglycemia reported.  -her diabetes is complicated by obesity/sedentary life and she remains at a high risk for more acute and chronic complications which include CAD, CVA, CKD, retinopathy, and neuropathy. These are all discussed in detail with her.  -  Nutritional counseling repeated at each  appointment due to patients tendency to fall back in to old habits.  - The patient admits there is a room for improvement in their diet and drink choices. -  Suggestion is made for the patient to avoid simple carbohydrates from their diet including Cakes, Sweet Desserts / Pastries, Ice Cream, Soda (diet and regular), Sweet Tea, Candies, Chips, Cookies, Sweet Pastries, Store Bought Juices, Alcohol in Excess of 1-2 drinks a day, Artificial Sweeteners, Coffee Creamer, and "Sugar-free" Products. This will help patient to have stable blood glucose profile and potentially avoid unintended weight gain.   - I encouraged the patient to switch to unprocessed or minimally processed complex starch and increased protein intake (animal or plant source), fruits, and vegetables.   - Patient is advised to stick to a routine mealtimes to eat 3 meals a day and avoid unnecessary snacks (to snack only to correct hypoglycemia).  - she will be scheduled with Jearld Fenton, RDN, CDE for individualized diabetes education.  - I have approached her with the following individualized plan to manage diabetes and patient agrees:   -Based on her presenting glycemic profile, she will not need prandial insulin for now.    -Given her improved A1c, I advised her to continue her Tresiba 45 units nightly and she can stay of her Metformin for now as she could not tolerate even the ER form.  I discussed and increased her Trulicity to 3 mg SQ weekly (may use 2 of her 1.5 mg doses until she depletes current supply) x 2 weeks and then may advance to 4.5 mg SQ weekly thereafter if tolerated well.   -She is encouraged to consistently monitor glucose twice daily, before breakfast and before bed and call the clinic if she has readings less than 70 or greater than 200 for 3 tests in a row.  - Patient specific target  A1c;  LDL, HDL, Triglycerides,  were discussed in detail.  2) Blood Pressure /Hypertension: Her blood pressure is not  controlled to target.  She is advised to continue Lasix 40 mg po daily prn for fluid.  She is advised to continue Losartan 25 mg po daily (says this leaves a bad taste in her mouth).  May consider increasing dose at next visit if BP still elevated.  3) Lipids/Hyperlipidemia:   Her most recent lipid panel from 09/25/20 shows controlled LDL of 38.  She is advised to continue Crestor 10 mg po daily before bed.  Side effects and precautions discussed with her.   4)  Weight/Diet:  Her Body mass index is 37.68 kg/m.-   clearly complicating her diabetes care.  She is a candidate for modest weight loss.  I discussed with her the fact that loss of 5 - 10% of her  current body weight will have the most impact on her diabetes management.  CDE Consult will be initiated . Exercise, and detailed carbohydrates information provided  -  detailed on discharge instructions.  5) Chronic Care/Health Maintenance: -she on ARB and Statin medication and is encouraged to initiate and continue to follow up with Ophthalmology, Dentist, Podiatrist at least yearly or according to recommendations, and advised to stay away from smoking. I have recommended yearly flu vaccine and pneumonia vaccine at least every 5 years; moderate intensity exercise for up to 150 minutes weekly; and  sleep for at least 7 hours a day.  - she is advised to maintain close follow up with [name], MD for primary care needs,  as well as her other providers for optimal and coordinated care.     I spent 47 minutes in the care of the patient today including review of labs from [facility], Lipids, Thyroid Function, Hematology (current and previous including abstractions from other facilities); face-to-face time discussing  her blood glucose readings/logs, discussing hypoglycemia and hyperglycemia episodes and symptoms, medications doses, her options of short and long term treatment based on the latest standards of care / guidelines;  discussion about incorporating  lifestyle medicine;  and documenting the encounter.    Please refer to Patient Instructions for Blood Glucose Monitoring and Insulin/Medications Dosing Guide"  in media tab for additional information. Please  also refer to " Patient Self Inventory" in the Media  tab for reviewed elements of pertinent patient history.  Allena Katz participated in the discussions, expressed understanding, and voiced agreement with the above plans.  All questions were answered to her satisfaction. she is encouraged to contact clinic should she have any questions or concerns prior to her return visit.    Follow up plan: - Return in about 4 months (around 05/31/2021) for Diabetes F/U with A1c in office, Previsit labs, Bring meter and logs.   Rayetta Pigg, [facility] [address] [place], [place] [address] Phone: [phone] Fax: [phone]  01/29/2021, 1:42 PM

## 2021-02-06 DIAGNOSIS — J069 Acute upper respiratory infection, unspecified: Secondary | ICD-10-CM | POA: Diagnosis not present

## 2021-02-20 DIAGNOSIS — E782 Mixed hyperlipidemia: Secondary | ICD-10-CM | POA: Diagnosis not present

## 2021-02-20 DIAGNOSIS — E1165 Type 2 diabetes mellitus with hyperglycemia: Secondary | ICD-10-CM | POA: Diagnosis not present

## 2021-02-20 DIAGNOSIS — I10 Essential (primary) hypertension: Secondary | ICD-10-CM | POA: Diagnosis not present

## 2021-02-20 DIAGNOSIS — M1990 Unspecified osteoarthritis, unspecified site: Secondary | ICD-10-CM | POA: Diagnosis not present

## 2021-03-31 DIAGNOSIS — E118 Type 2 diabetes mellitus with unspecified complications: Secondary | ICD-10-CM | POA: Diagnosis not present

## 2021-03-31 DIAGNOSIS — M2550 Pain in unspecified joint: Secondary | ICD-10-CM | POA: Diagnosis not present

## 2021-03-31 DIAGNOSIS — M797 Fibromyalgia: Secondary | ICD-10-CM | POA: Diagnosis not present

## 2021-03-31 DIAGNOSIS — I10 Essential (primary) hypertension: Secondary | ICD-10-CM | POA: Diagnosis not present

## 2021-03-31 DIAGNOSIS — G894 Chronic pain syndrome: Secondary | ICD-10-CM | POA: Diagnosis not present

## 2021-03-31 DIAGNOSIS — E782 Mixed hyperlipidemia: Secondary | ICD-10-CM | POA: Diagnosis not present

## 2021-04-01 ENCOUNTER — Other Ambulatory Visit (HOSPITAL_COMMUNITY): Payer: Self-pay | Admitting: Family Medicine

## 2021-04-01 DIAGNOSIS — Z1231 Encounter for screening mammogram for malignant neoplasm of breast: Secondary | ICD-10-CM

## 2021-04-07 ENCOUNTER — Telehealth: Payer: Self-pay

## 2021-04-07 NOTE — Telephone Encounter (Signed)
Increase Tresiba to 55 units SQ nightly for now to help bring numbers back down.

## 2021-04-07 NOTE — Telephone Encounter (Signed)
Readings: Patient had a steroid shot on August 8th.    8/10-  6 am 124, 1:38 pm 127, 5:45 pm 169, 10:30 pm 241 8/11- 6:57 am 177, 2:56 pm 174, 6:46 pm 210 8/12- 6:46 am 210, 12:36 pm 242,  6:19 pm 203, 10:49 pm 204 8/13- 9:58 am 172, 10:54 am 193 8/14-9:18 am 217, 10:31 am 202 8/15- 6:54 am 216

## 2021-04-07 NOTE — Telephone Encounter (Signed)
Pt made aware

## 2021-04-16 ENCOUNTER — Ambulatory Visit (HOSPITAL_COMMUNITY)
Admission: RE | Admit: 2021-04-16 | Discharge: 2021-04-16 | Disposition: A | Discharge status: Home / Self Care | Payer: Medicare Other | Source: Ambulatory Visit | Attending: Family Medicine | Admitting: Family Medicine

## 2021-04-16 ENCOUNTER — Other Ambulatory Visit: Payer: Self-pay

## 2021-04-16 DIAGNOSIS — Z1231 Encounter for screening mammogram for malignant neoplasm of breast: Secondary | ICD-10-CM | POA: Diagnosis not present

## 2021-04-23 DIAGNOSIS — I10 Essential (primary) hypertension: Secondary | ICD-10-CM | POA: Diagnosis not present

## 2021-04-23 DIAGNOSIS — M1990 Unspecified osteoarthritis, unspecified site: Secondary | ICD-10-CM | POA: Diagnosis not present

## 2021-04-23 DIAGNOSIS — E1165 Type 2 diabetes mellitus with hyperglycemia: Secondary | ICD-10-CM | POA: Diagnosis not present

## 2021-04-23 DIAGNOSIS — E782 Mixed hyperlipidemia: Secondary | ICD-10-CM | POA: Diagnosis not present

## 2021-05-13 ENCOUNTER — Other Ambulatory Visit: Payer: Self-pay | Admitting: Nurse Practitioner

## 2021-05-13 NOTE — Telephone Encounter (Signed)
[facility] called and states pt told them her medication was increased to 55 units and they are needing a updated rx

## 2021-05-13 NOTE — Telephone Encounter (Signed)
Yes, that is fine. 

## 2021-05-13 NOTE — Telephone Encounter (Signed)
Patient told pharmacy that she has been doing 55 units of her Tyler Aas from telephone note on 04/07/2021. Okay to change the prescription? (It said for now so want to double check) Last OV 01/29/2021 Next OV 06/02/2021

## 2021-05-19 ENCOUNTER — Other Ambulatory Visit: Payer: Self-pay

## 2021-05-19 ENCOUNTER — Ambulatory Visit: Payer: Medicare Other | Admitting: Gastroenterology

## 2021-05-19 ENCOUNTER — Encounter: Payer: Self-pay | Admitting: Gastroenterology

## 2021-05-19 ENCOUNTER — Telehealth: Payer: Self-pay

## 2021-05-19 VITALS — BP 135/67 | HR 87 | Temp 96.9°F | Ht 61.0 in | Wt 206.6 lb

## 2021-05-19 DIAGNOSIS — R1013 Epigastric pain: Secondary | ICD-10-CM

## 2021-05-19 DIAGNOSIS — Z86010 Personal history of colonic polyps: Secondary | ICD-10-CM

## 2021-05-19 DIAGNOSIS — R140 Abdominal distension (gaseous): Secondary | ICD-10-CM

## 2021-05-19 DIAGNOSIS — G8929 Other chronic pain: Secondary | ICD-10-CM | POA: Diagnosis not present

## 2021-05-19 DIAGNOSIS — R1310 Dysphagia, unspecified: Secondary | ICD-10-CM

## 2021-05-19 DIAGNOSIS — K219 Gastro-esophageal reflux disease without esophagitis: Secondary | ICD-10-CM

## 2021-05-19 MED ORDER — RIFAXIMIN 550 MG PO TABS: 550.0000 mg | ORAL_TABLET | Freq: Three times a day (TID) | ORAL | 0 refills | Status: DC

## 2021-05-19 MED ORDER — CLENPIQ 10-3.5-12 MG-GM -GM/160ML PO SOLN: 1.0000 | Freq: Once | ORAL | 0 refills | Status: AC

## 2021-05-19 NOTE — H&P (View-Only) (Signed)
Primary Care Physician: [name], MD  Primary Gastroenterologist:  Elon Alas. Abbey Chatters, DO   Chief Complaint  Patient presents with   Diarrhea    None in a while since she stopped metformin   Gastroesophageal Reflux    At times food feels like it gets stuck going down    HPI: Erica Price is a 60 y.o. female here for follow-up.  Last seen in July 2021.  History of GERD and diarrhea.  Last colonoscopy in January 2019, simple adenomas, normal colon biopsies.  Needs surveillance in 2022 due to history of polyps.  Presents today with complaints of recurrent gas/bloating. Previously responded nicely to Xifaxin taken in early 2021. Just recently with recurrent symptoms. Also complains of epigastric pain. Now on Celebrex for arthritis. Dysphagia to pills, sometimes solid foods. With movement, going from sitting to standing or if bends over, feels stuff trying to come up. Heartburn well controlled on pantoprazole daily. A1C down to 6.7. Had some issues after steroid injections few months back and now back to low 100 range. BMs still doing ok off metformin (stopped in 01/2021). With increased tramadol for pain has had some constipation (usually if takes couple of days in a row). Does not have to take tramadol daily. Stool softener helps. No bentyl since off metformin. No unintentional weight loss.   Current Outpatient Medications  Medication Sig Dispense Refill   albuterol (PROVENTIL HFA;VENTOLIN HFA) 108 (90 Base) MCG/ACT inhaler Inhale 2 puffs into the lungs every 6 (six) hours as needed for wheezing or shortness of breath.     aluminum chloride (DRYSOL) 20 % external solution Apply topically as needed.      Artificial Tear Solution (SOOTHE XP) SOLN Apply 1 drop to eye daily as needed (dry eyes).     celecoxib (CELEBREX) 200 MG capsule Take 200 mg by mouth daily as needed.     Cholecalciferol (VITAMIN D3) 5000 units CAPS 10,000 Units daily.      dicyclomine (BENTYL) 10 MG capsule Take  1 capsule (10 mg total) by mouth 4 (four) times daily as needed. 180 capsule 1   Dulaglutide (TRULICITY) 4.5 NF/6.2ZH SOPN Inject 4.5 mg as directed once a week.     DULoxetine (CYMBALTA) 60 MG capsule Take 60 mg daily by mouth.      fluticasone (FLONASE) 50 MCG/ACT nasal spray Place 2 sprays into both nostrils daily as needed for allergies or rhinitis.     furosemide (LASIX) 20 MG tablet Take 20 mg by mouth daily as needed for fluid or edema.      gabapentin (NEURONTIN) 300 MG capsule Take 600 mg by mouth 3 (three) times daily.     insulin degludec (TRESIBA FLEXTOUCH) 100 UNIT/ML FlexTouch Pen Inject 55 Units into the skin at bedtime. 45 mL 3   Ketorolac Tromethamine (TORADOL IJ) Inject as directed as needed. Gets at dr office     Lancets [facility] DELICA PLUS [id number]) [place] Apply topically.     losartan (COZAAR) 25 MG tablet TAKE ONE TABLET BY MOUTH ONCE DAILY 90 tablet 0   NYAMYC powder      ONETOUCH ULTRA test strip 1 each 3 (three) times daily.     pantoprazole (PROTONIX) 40 MG tablet TAKE ONE TABLET BY MOUTH DAILY 30 MINUTES PRIOR TO BREAKFAST. 90 tablet 3   rOPINIRole (REQUIP) 0.5 MG tablet Take 0.5-1 mg by mouth at bedtime.     rosuvastatin (CRESTOR) 10 MG tablet Take 1 tablet (10 mg total) by  mouth daily. 90 tablet 3   SURE COMFORT PEN NEEDLES 31G X 8 MM MISC USE DAILY WITH TRESIBA. 100 each 1   traMADol (ULTRAM) 50 MG tablet Take 50-100 mg by mouth every 6 (six) hours as needed for moderate pain (depends on pain level if takes  1-2 tablets).     zolpidem (AMBIEN) 10 MG tablet Take 5-10 mg by mouth at bedtime as needed for sleep (depends on insomnia if takes 0.5-1 tablet).     No current facility-administered medications for this visit.    Allergies as of 05/19/2021   (No Known Allergies)   Past Medical History:  Diagnosis Date   Apnea    Poss OSA   Arthralgia    Arthritis    Depression    Diabetes mellitus without complication (HCC)    Fatigue    Fibromyalgia    GERD  (gastroesophageal reflux disease)    IBS (irritable bowel syndrome)    Myalgia    Pinched nerve    lumbar-l5-s1   PONV (postoperative nausea and vomiting)    Snoring    Past Surgical History:  Procedure Laterality Date   ARTHROSCOPIC REPAIR ACL Left    BIOPSY N/A 06/12/2014   Procedure: BIOPSY;  Surgeon: Danie Binder, MD;  Location: [facility];  Service: Endoscopy;  Laterality: N/A;   BIOPSY  09/14/2017   Procedure: BIOPSY;  Surgeon: Danie Binder, MD;  Location: [facility];  Service: Endoscopy;;  colon   CARPAL TUNNEL RELEASE Right    CHOLECYSTECTOMY  1990s   CLUB FOOT RELEASE Left    x3   COLONOSCOPY WITH PROPOFOL N/A 06/12/2014   Dr. Suzette Battiest colon polyps removed/moderate divertiiculosis/small internal hemorrhoids/moderate sized external hemorrhoids. tubular adenomas. Next surveillance in Oct 2018.    COLONOSCOPY WITH PROPOFOL N/A 09/14/2017   Dr. Oneida Alar: simple adenomas, moderate diverticulosis in rectosigmoid colon, sigmoid colon, and descending colon. External and internal hemorrhoids, normal colon biopsies. simple adenomas. Needs surveillance in 3 years with Propofol, colowrap   ESOPHAGOGASTRODUODENOSCOPY (EGD) WITH PROPOFOL N/A 06/12/2014   Dr. 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 benign gastric and duodenal biopsies   POLYPECTOMY N/A 06/12/2014   Procedure: POLYPECTOMY;  Surgeon: Danie Binder, MD;  Location: [facility];  Service: Endoscopy;  Laterality: N/A;   POLYPECTOMY  09/14/2017   Procedure: POLYPECTOMY;  Surgeon: Danie Binder, MD;  Location: [facility];  Service: Endoscopy;;  colon   SAVORY DILATION N/A 06/12/2014   Procedure: SAVORY DILATION;  Surgeon: Danie Binder, MD;  Location: [facility];  Service: Endoscopy;  Laterality: N/A;  12.8-17   Family History  Problem Relation Age of Onset   Heart failure Father        Died, 3   Hypertension Mother        Living, 6   Breast cancer Mother    Kidney cancer Mother     Hypercholesterolemia Son    Breast cancer Sister    Pancreatic cancer Brother        passed age 2   Hypertension Sister    Colon cancer Neg Hx    Social History   Tobacco Use   Smoking status: Former    Packs/day: 0.50    Years: 20.00    Pack years: 10.00    Types: Cigarettes    Quit date: 10/18/2011    Years since quitting: 9.5   Smokeless tobacco: Never   Tobacco comments:    quit in 2014  Vaping Use  Vaping Use: Never used  Substance Use Topics   Alcohol use: No   Drug use: No    ROS:  General: Negative for anorexia, weight loss, fever, chills, fatigue, weakness. ENT: Negative for hoarseness, difficulty swallowing , nasal congestion. CV: Negative for chest pain, angina, palpitations, dyspnea on exertion, peripheral edema.  Respiratory: Negative for dyspnea at rest, dyspnea on exertion, cough, sputum, wheezing.  GI: See history of present illness. GU:  Negative for dysuria, hematuria, urinary incontinence, urinary frequency, nocturnal urination.  Endo: Negative for unusual weight change.    Physical Examination:   BP 135/67   Pulse 87   Temp (!) 96.9 F (36.1 C)   Ht 5\' 1"  (1.549 m)   Wt 206 lb 9.6 oz (93.7 kg)   BMI 39.04 kg/m   General: Well-nourished, well-developed in no acute distress.  Eyes: No icterus. Mouth: masked Lungs: Clear to auscultation bilaterally.  Heart: Regular rate and rhythm, no murmurs rubs or gallops.  Abdomen: Bowel sounds are normal,  nondistended, no hepatosplenomegaly or masses, no abdominal bruits or hernia , no rebound or guarding.  Mild epigastric tenderness Extremities: No lower extremity edema. No clubbing or deformities. Neuro: Alert and oriented x 4   Skin: Warm and dry, no jaundice.   Psych: Alert and cooperative, normal mood and affect.  Labs:  Lab Results  Component Value Date   CREATININE 0.9 09/25/2020   BUN 15 09/25/2020   NA 138 05/16/2020   K 5.4 (H) 05/16/2020   CL 100 05/16/2020   CO2 25 05/16/2020    Lab Results  Component Value Date   ALT 19 05/16/2020   AST 15 05/16/2020   ALKPHOS 62 05/16/2020   BILITOT 0.4 05/16/2020    Lab Results  Component Value Date   HGBA1C 6.7 01/21/2021     Imaging Studies: No results found.   Assessment:  Epigastric pain/dysphagia: on pantoprazole once daily for reflux. No heartburn. Now on celebrex for arthritis. Complains of bloating after meals. Ddx: includes gastritis/ulcers/ gastroparesis. Dysphagia could be due to complicated GERD. Recommend EGD/ED to evaluate.   Bloating/gas: responded well to Xifaxan in the past. Possible SIBO.   H/o colon polyps: Due for 3-year surveillance colonoscopy at this time.   Plan:  Colonoscopy and upper endoscopy with possible esophageal dilation with Dr. Abbey Chatters. ASA III.  I have discussed the risks, alternatives, benefits with regards to but not limited to the risk of reaction to medication, bleeding, infection, perforation and the patient is agreeable to proceed. Written consent to be obtained. Xifaxan 550mg  TID for 14 days.

## 2021-05-19 NOTE — Patient Instructions (Signed)
Trial of Xifaxan 550mg  three time daily for 14 days for gas/bloating. Upper endoscopy and colonoscopy as scheduled. See separate instructions.

## 2021-05-19 NOTE — Telephone Encounter (Signed)
PA for TCS/EGD/DIL submitted via [facility] website. PA# [id number], valid 06/05/21-08/23/21.

## 2021-05-19 NOTE — Progress Notes (Signed)
Primary Care Physician: [name], MD  Primary Gastroenterologist:  Elon Alas. Abbey Chatters, DO   Chief Complaint  Patient presents with   Diarrhea    None in a while since she stopped metformin   Gastroesophageal Reflux    At times food feels like it gets stuck going down    HPI: Erica Price is a 60 y.o. female here for follow-up.  Last seen in July 2021.  History of GERD and diarrhea.  Last colonoscopy in January 2019, simple adenomas, normal colon biopsies.  Needs surveillance in 2022 due to history of polyps.  Presents today with complaints of recurrent gas/bloating. Previously responded nicely to Xifaxin taken in early 2021. Just recently with recurrent symptoms. Also complains of epigastric pain. Now on Celebrex for arthritis. Dysphagia to pills, sometimes solid foods. With movement, going from sitting to standing or if bends over, feels stuff trying to come up. Heartburn well controlled on pantoprazole daily. A1C down to 6.7. Had some issues after steroid injections few months back and now back to low 100 range. BMs still doing ok off metformin (stopped in 01/2021). With increased tramadol for pain has had some constipation (usually if takes couple of days in a row). Does not have to take tramadol daily. Stool softener helps. No bentyl since off metformin. No unintentional weight loss.   Current Outpatient Medications  Medication Sig Dispense Refill   albuterol (PROVENTIL HFA;VENTOLIN HFA) 108 (90 Base) MCG/ACT inhaler Inhale 2 puffs into the lungs every 6 (six) hours as needed for wheezing or shortness of breath.     aluminum chloride (DRYSOL) 20 % external solution Apply topically as needed.      Artificial Tear Solution (SOOTHE XP) SOLN Apply 1 drop to eye daily as needed (dry eyes).     celecoxib (CELEBREX) 200 MG capsule Take 200 mg by mouth daily as needed.     Cholecalciferol (VITAMIN D3) 5000 units CAPS 10,000 Units daily.      dicyclomine (BENTYL) 10 MG capsule Take  1 capsule (10 mg total) by mouth 4 (four) times daily as needed. 180 capsule 1   Dulaglutide (TRULICITY) 4.5 EX/9.3ZJ SOPN Inject 4.5 mg as directed once a week.     DULoxetine (CYMBALTA) 60 MG capsule Take 60 mg daily by mouth.      fluticasone (FLONASE) 50 MCG/ACT nasal spray Place 2 sprays into both nostrils daily as needed for allergies or rhinitis.     furosemide (LASIX) 20 MG tablet Take 20 mg by mouth daily as needed for fluid or edema.      gabapentin (NEURONTIN) 300 MG capsule Take 600 mg by mouth 3 (three) times daily.     insulin degludec (TRESIBA FLEXTOUCH) 100 UNIT/ML FlexTouch Pen Inject 55 Units into the skin at bedtime. 45 mL 3   Ketorolac Tromethamine (TORADOL IJ) Inject as directed as needed. Gets at dr office     Lancets [facility] DELICA PLUS [id number]) [place] Apply topically.     losartan (COZAAR) 25 MG tablet TAKE ONE TABLET BY MOUTH ONCE DAILY 90 tablet 0   NYAMYC powder      ONETOUCH ULTRA test strip 1 each 3 (three) times daily.     pantoprazole (PROTONIX) 40 MG tablet TAKE ONE TABLET BY MOUTH DAILY 30 MINUTES PRIOR TO BREAKFAST. 90 tablet 3   rOPINIRole (REQUIP) 0.5 MG tablet Take 0.5-1 mg by mouth at bedtime.     rosuvastatin (CRESTOR) 10 MG tablet Take 1 tablet (10 mg total) by  mouth daily. 90 tablet 3   SURE COMFORT PEN NEEDLES 31G X 8 MM MISC USE DAILY WITH TRESIBA. 100 each 1   traMADol (ULTRAM) 50 MG tablet Take 50-100 mg by mouth every 6 (six) hours as needed for moderate pain (depends on pain level if takes  1-2 tablets).     zolpidem (AMBIEN) 10 MG tablet Take 5-10 mg by mouth at bedtime as needed for sleep (depends on insomnia if takes 0.5-1 tablet).     No current facility-administered medications for this visit.    Allergies as of 05/19/2021   (No Known Allergies)   Past Medical History:  Diagnosis Date   Apnea    Poss OSA   Arthralgia    Arthritis    Depression    Diabetes mellitus without complication (HCC)    Fatigue    Fibromyalgia    GERD  (gastroesophageal reflux disease)    IBS (irritable bowel syndrome)    Myalgia    Pinched nerve    lumbar-l5-s1   PONV (postoperative nausea and vomiting)    Snoring    Past Surgical History:  Procedure Laterality Date   ARTHROSCOPIC REPAIR ACL Left    BIOPSY N/A 06/12/2014   Procedure: BIOPSY;  Surgeon: Danie Binder, MD;  Location: [facility];  Service: Endoscopy;  Laterality: N/A;   BIOPSY  09/14/2017   Procedure: BIOPSY;  Surgeon: Danie Binder, MD;  Location: [facility];  Service: Endoscopy;;  colon   CARPAL TUNNEL RELEASE Right    CHOLECYSTECTOMY  1990s   CLUB FOOT RELEASE Left    x3   COLONOSCOPY WITH PROPOFOL N/A 06/12/2014   Dr. Suzette Battiest colon polyps removed/moderate divertiiculosis/small internal hemorrhoids/moderate sized external hemorrhoids. tubular adenomas. Next surveillance in Oct 2018.    COLONOSCOPY WITH PROPOFOL N/A 09/14/2017   Dr. Oneida Alar: simple adenomas, moderate diverticulosis in rectosigmoid colon, sigmoid colon, and descending colon. External and internal hemorrhoids, normal colon biopsies. simple adenomas. Needs surveillance in 3 years with Propofol, colowrap   ESOPHAGOGASTRODUODENOSCOPY (EGD) WITH PROPOFOL N/A 06/12/2014   Dr. 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 benign gastric and duodenal biopsies   POLYPECTOMY N/A 06/12/2014   Procedure: POLYPECTOMY;  Surgeon: Danie Binder, MD;  Location: [facility];  Service: Endoscopy;  Laterality: N/A;   POLYPECTOMY  09/14/2017   Procedure: POLYPECTOMY;  Surgeon: Danie Binder, MD;  [facility]: [facility];  Service: Endoscopy;;  colon   SAVORY DILATION N/A 06/12/2014   Procedure: SAVORY DILATION;  Surgeon: Danie Binder, MD;  Location: [facility];  Service: Endoscopy;  Laterality: N/A;  12.8-17   Family History  Problem Relation Age of Onset   Heart failure Father        Died, 67   Hypertension Mother        Living, 17   Breast cancer Mother    Kidney cancer Mother     Hypercholesterolemia Son    Breast cancer Sister    Pancreatic cancer Brother        passed age 35   Hypertension Sister    Colon cancer Neg Hx    Social History   Tobacco Use   Smoking status: Former    Packs/day: 0.50    Years: 20.00    Pack years: 10.00    Types: Cigarettes    Quit date: 10/18/2011    Years since quitting: 9.5   Smokeless tobacco: Never   Tobacco comments:    quit in 2014  Vaping Use  Vaping Use: Never used  Substance Use Topics   Alcohol use: No   Drug use: No    ROS:  General: Negative for anorexia, weight loss, fever, chills, fatigue, weakness. ENT: Negative for hoarseness, difficulty swallowing , nasal congestion. CV: Negative for chest pain, angina, palpitations, dyspnea on exertion, peripheral edema.  Respiratory: Negative for dyspnea at rest, dyspnea on exertion, cough, sputum, wheezing.  GI: See history of present illness. GU:  Negative for dysuria, hematuria, urinary incontinence, urinary frequency, nocturnal urination.  Endo: Negative for unusual weight change.    Physical Examination:   BP 135/67   Pulse 87   Temp (!) 96.9 F (36.1 C)   Ht 5\' 1"  (1.549 m)   Wt 206 lb 9.6 oz (93.7 kg)   BMI 39.04 kg/m   General: Well-nourished, well-developed in no acute distress.  Eyes: No icterus. Mouth: masked Lungs: Clear to auscultation bilaterally.  Heart: Regular rate and rhythm, no murmurs rubs or gallops.  Abdomen: Bowel sounds are normal,  nondistended, no hepatosplenomegaly or masses, no abdominal bruits or hernia , no rebound or guarding.  Mild epigastric tenderness Extremities: No lower extremity edema. No clubbing or deformities. Neuro: Alert and oriented x 4   Skin: Warm and dry, no jaundice.   Psych: Alert and cooperative, normal mood and affect.  Labs:  Lab Results  Component Value Date   CREATININE 0.9 09/25/2020   BUN 15 09/25/2020   NA 138 05/16/2020   K 5.4 (H) 05/16/2020   CL 100 05/16/2020   CO2 25 05/16/2020    Lab Results  Component Value Date   ALT 19 05/16/2020   AST 15 05/16/2020   ALKPHOS 62 05/16/2020   BILITOT 0.4 05/16/2020    Lab Results  Component Value Date   HGBA1C 6.7 01/21/2021     Imaging Studies: No results found.   Assessment:  Epigastric pain/dysphagia: on pantoprazole once daily for reflux. No heartburn. Now on celebrex for arthritis. Complains of bloating after meals. Ddx: includes gastritis/ulcers/ gastroparesis. Dysphagia could be due to complicated GERD. Recommend EGD/ED to evaluate.   Bloating/gas: responded well to Xifaxan in the past. Possible SIBO.   H/o colon polyps: Due for 3-year surveillance colonoscopy at this time.   Plan:  Colonoscopy and upper endoscopy with possible esophageal dilation with Dr. Abbey Chatters. ASA III.  I have discussed the risks, alternatives, benefits with regards to but not limited to the risk of reaction to medication, bleeding, infection, perforation and the patient is agreeable to proceed. Written consent to be obtained. Xifaxan 550mg  TID for 14 days.

## 2021-05-20 ENCOUNTER — Telehealth: Payer: Self-pay

## 2021-05-20 NOTE — Telephone Encounter (Signed)
[facility] approved pt's Xifaxan tab 550 mg through 06/02/2021. Faxed to the pt's pharmacy and gave to Manuela Schwartz to send to scan

## 2021-05-21 NOTE — Telephone Encounter (Signed)
Patrina from [employer] [phone]) phoned wanting an alternative sent in for the pt because the pt (even after the PA was done) would still have to pay over $500.00 for the Rx. Please advise

## 2021-05-22 NOTE — Telephone Encounter (Signed)
OK. Previously she found xifaxan helpful for bloating etc. Thought we would try again. Due to expense and lack of samples, less hold off pending colonoscopy/egd findings.

## 2021-05-23 DIAGNOSIS — E782 Mixed hyperlipidemia: Secondary | ICD-10-CM | POA: Diagnosis not present

## 2021-05-23 DIAGNOSIS — I10 Essential (primary) hypertension: Secondary | ICD-10-CM | POA: Diagnosis not present

## 2021-05-23 DIAGNOSIS — E1165 Type 2 diabetes mellitus with hyperglycemia: Secondary | ICD-10-CM | POA: Diagnosis not present

## 2021-05-23 DIAGNOSIS — M1990 Unspecified osteoarthritis, unspecified site: Secondary | ICD-10-CM | POA: Diagnosis not present

## 2021-05-26 NOTE — Telephone Encounter (Signed)
Is there anything else to do with this pt from my end?

## 2021-05-26 NOTE — Telephone Encounter (Signed)
No. We will wait to see what procedures show.

## 2021-05-26 NOTE — Telephone Encounter (Signed)
noted 

## 2021-05-27 DIAGNOSIS — E1165 Type 2 diabetes mellitus with hyperglycemia: Secondary | ICD-10-CM | POA: Diagnosis not present

## 2021-05-28 ENCOUNTER — Telehealth: Payer: Self-pay | Admitting: Gastroenterology

## 2021-05-28 LAB — COMPREHENSIVE METABOLIC PANEL
ALT: 9 IU/L (ref 0–32)
AST: 18 IU/L (ref 0–40)
Albumin/Globulin Ratio: 1.5 (ref 1.2–2.2)
Albumin: 4.3 g/dL (ref 3.8–4.9)
Alkaline Phosphatase: 81 IU/L (ref 44–121)
BUN/Creatinine Ratio: 17 (ref 9–23)
BUN: 15 mg/dL (ref 6–24)
Bilirubin Total: 0.6 mg/dL (ref 0.0–1.2)
CO2: 22 mmol/L (ref 20–29)
Calcium: 9.2 mg/dL (ref 8.7–10.2)
Chloride: 98 mmol/L (ref 96–106)
Creatinine, Ser: 0.89 mg/dL (ref 0.57–1.00)
Globulin, Total: 2.9 g/dL (ref 1.5–4.5)
Glucose: 144 mg/dL — ABNORMAL HIGH (ref 70–99)
Potassium: 4.6 mmol/L (ref 3.5–5.2)
Sodium: 135 mmol/L (ref 134–144)
Total Protein: 7.2 g/dL (ref 6.0–8.5)
eGFR: 75 mL/min/{1.73_m2} (ref >59)

## 2021-05-28 LAB — TSH: TSH: 1.68 u[IU]/mL (ref 0.450–4.500)

## 2021-05-28 LAB — T4, FREE: Free T4: 1.18 ng/dL (ref 0.82–1.77)

## 2021-05-28 NOTE — Telephone Encounter (Signed)
Spoke with pt and informed her that we do not have samples of Xifaxan. Pt verbalized understanding.

## 2021-05-28 NOTE — Telephone Encounter (Signed)
PATIENT CALLED AND WAS SUPPOSED TO BE PUT ON AN ANTIBIOTIC, BUT IT IS TOO EXPENSIVE.  SHE WAS CALLING TO SEE IF WE HAD ANY SAMPLES

## 2021-05-30 ENCOUNTER — Encounter (HOSPITAL_COMMUNITY): Payer: Self-pay

## 2021-05-30 NOTE — Patient Instructions (Signed)

## 2021-05-30 NOTE — Patient Instructions (Signed)
Your procedure is scheduled on: 06/05/2021  Report to [facility] at     6:15 AM.  Call this number if you have problems the morning of surgery: [phone]   Remember:              Follow Directions on the letter you received from Your Physician's office regarding the Bowel Prep              No Smoking the day of Procedure :   Take these medicines the morning of surgery with A SIP OF WATER: Tramadol, Pantoprazole, Cymbalta, and gabapentin  No diabetic medications am of procedure   Take only 1/2 dose of your long acting insulin Tresiba 22 1/2 units the night before procedure   Do not wear jewelry, make-up or nail polish.    Do not bring valuables to the hospital.  Contacts, dentures or bridgework may not be worn into surgery.  .   Patients discharged the day of surgery will not be allowed to drive home.     Colonoscopy, Adult, Care After This sheet gives you information about how to care for yourself after your procedure. Your health care provider may also give you more specific instructions. If you have problems or questions, contact your health care provider. What can I expect after the procedure? After the procedure, it is common to have: A small amount of blood in your stool for 24 hours after the procedure. Some gas. Mild abdominal cramping or bloating.  Follow these instructions at home: General instructions  For the first 24 hours after the procedure: Do not drive or use machinery. Do not sign important documents. Do not drink alcohol. Do your regular daily activities at a slower pace than normal. Eat soft, easy-to-digest foods. Rest often. Take over-the-counter or prescription medicines only as told by your health care provider. It is up to you to get the results of your procedure. Ask your health care provider, or the department performing the procedure, when your results will be ready. Relieving cramping and bloating Try walking around when you have cramps or  feel bloated. Apply heat to your abdomen as told by your health care provider. Use a heat source that your health care provider recommends, such as a moist heat pack or a heating pad. Place a towel between your skin and the heat source. Leave the heat on for 20-30 minutes. Remove the heat if your skin turns bright red. This is especially important if you are unable to feel pain, heat, or cold. You may have a greater risk of getting burned. Eating and drinking Drink enough fluid to keep your urine clear or pale yellow. Resume your normal diet as instructed by your health care provider. Avoid heavy or fried foods that are hard to digest. Avoid drinking alcohol for as long as instructed by your health care provider. Contact a health care provider if: You have blood in your stool 2-3 days after the procedure. Get help right away if: You have more than a small spotting of blood in your stool. You pass large blood clots in your stool. Your abdomen is swollen. You have nausea or vomiting. You have a fever. You have increasing abdominal pain that is not relieved with medicine. This information is not intended to replace advice given to you by your health care provider. Make sure you discuss any questions you have with your health care provider. Document Released: 03/24/2004 Document Revised: 05/04/2016 Document Reviewed: 10/22/2015 Elsevier Interactive Patient Education  2018 [place].  Upper Endoscopy, Adult, Care After This sheet gives you information about how to care for yourself after your procedure. Your health care provider may also give you more specific instructions. If you have problems or questions, contact your health care provider. What can I expect after the procedure? After the procedure, it is common to have: A sore throat. Mild stomach pain or discomfort. Bloating. Nausea. Follow these instructions at home:  Follow instructions from your health care provider about what to  eat or drink after your procedure. Return to your normal activities as told by your health care provider. Ask your health care provider what activities are safe for you. Take over-the-counter and prescription medicines only as told by your health care provider. If you were given a sedative during the procedure, it can affect you for several hours. Do not drive or operate machinery until your health care provider says that it is safe. Keep all follow-up visits as told by your health care provider. This is important. Contact a health care provider if you have: A sore throat that lasts longer than one day. Trouble swallowing. Get help right away if: You vomit blood or your vomit looks like coffee grounds. You have: A fever. Bloody, black, or tarry stools. A severe sore throat or you cannot swallow. Difficulty breathing. Severe pain in your chest or abdomen. Summary After the procedure, it is common to have a sore throat, mild stomach discomfort, bloating, and nausea. If you were given a sedative during the procedure, it can affect you for several hours. Do not drive or operate machinery until your health care provider says that it is safe. Follow instructions from your health care provider about what to eat or drink after your procedure. Return to your normal activities as told by your health care provider. This information is not intended to replace advice given to you by your health care provider. Make sure you discuss any questions you have with your health care provider. Document Revised: 08/08/2019 Document Reviewed: 01/10/2018 Elsevier Patient Education  2022 [employer].

## 2021-06-02 ENCOUNTER — Other Ambulatory Visit: Payer: Self-pay

## 2021-06-02 ENCOUNTER — Encounter: Payer: Self-pay | Admitting: Nurse Practitioner

## 2021-06-02 ENCOUNTER — Ambulatory Visit: Payer: Medicare Other | Admitting: Nurse Practitioner

## 2021-06-02 VITALS — BP 123/85 | HR 91 | Ht 61.0 in | Wt 206.4 lb

## 2021-06-02 DIAGNOSIS — E1165 Type 2 diabetes mellitus with hyperglycemia: Secondary | ICD-10-CM | POA: Diagnosis not present

## 2021-06-02 DIAGNOSIS — E782 Mixed hyperlipidemia: Secondary | ICD-10-CM

## 2021-06-02 LAB — POCT GLYCOSYLATED HEMOGLOBIN (HGB A1C): HbA1c, POC (controlled diabetic range): 7.4 % — AB (ref 0.0–7.0)

## 2021-06-02 NOTE — Progress Notes (Signed)
06/02/2021, 1:30 PM  Endocrinology follow-up note   Subjective:    Patient ID: Erica Price, female    DOB: 11-24-1960.  Erica Price is being seen  in follow-up for management of currently uncontrolled symptomatic diabetes requested by  [name], MD.   Past Medical History:  Diagnosis Date   Apnea    Poss OSA   Arthralgia    Arthritis    Depression    Diabetes mellitus without complication (HCC)    Fatigue    Fibromyalgia    GERD (gastroesophageal reflux disease)    Hypertension    IBS (irritable bowel syndrome)    Myalgia    Pinched nerve    [id number]   PONV (postoperative nausea and vomiting)    Snoring     Past Surgical History:  Procedure Laterality Date   ARTHROSCOPIC REPAIR ACL Left    BIOPSY N/A 06/12/2014   Procedure: BIOPSY;  Surgeon: Danie Binder, MD;  Location: [facility];  Service: Endoscopy;  Laterality: N/A;   BIOPSY  09/14/2017   Procedure: BIOPSY;  Surgeon: Danie Binder, MD;  Location: [facility];  Service: Endoscopy;;  colon   CARPAL TUNNEL RELEASE Right    CHOLECYSTECTOMY  1990s   CLUB FOOT RELEASE Left    x3   COLONOSCOPY WITH PROPOFOL N/A 06/12/2014   Dr. Suzette Battiest colon polyps removed/moderate divertiiculosis/small internal hemorrhoids/moderate sized external hemorrhoids. tubular adenomas. Next surveillance in Oct 2018.    COLONOSCOPY WITH PROPOFOL N/A 09/14/2017   Dr. Oneida Alar: simple adenomas, moderate diverticulosis in rectosigmoid colon, sigmoid colon, and descending colon. External and internal hemorrhoids, normal colon biopsies. simple adenomas. Needs surveillance in 3 years with Propofol, colowrap   ESOPHAGOGASTRODUODENOSCOPY (EGD) WITH PROPOFOL N/A 06/12/2014   Dr. 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 benign gastric and duodenal biopsies   POLYPECTOMY N/A 06/12/2014   Procedure: POLYPECTOMY;   Surgeon: Danie Binder, MD;  Location: [facility];  Service: Endoscopy;  Laterality: N/A;   POLYPECTOMY  09/14/2017   Procedure: POLYPECTOMY;  Surgeon: Danie Binder, MD;  Location: [facility];  Service: Endoscopy;;  colon   SAVORY DILATION N/A 06/12/2014   Procedure: SAVORY DILATION;  Surgeon: Danie Binder, MD;  Location: [facility];  Service: Endoscopy;  Laterality: N/A;  12.8-17    Social History   Socioeconomic History   Marital status: Married    Spouse name: Marcello Moores   Number of children: 2   Years of education: Not on file   Highest education level: Associate degree: occupational, Hotel manager, or vocational program  Occupational History   Occupation: disabled  Tobacco Use   Smoking status: Former    Packs/day: 0.50    Years: 20.00    Pack years: 10.00    Types: Cigarettes    Quit date: 10/18/2011    Years since quitting: 9.6   Smokeless tobacco: Never   Tobacco comments:    quit in 2014  Vaping Use   Vaping Use: Never used  Substance and Sexual Activity   Alcohol use: No   Drug use: No   Sexual activity: Not Currently  Birth control/protection: Post-menopausal  Other Topics Concern   Not on file  Social History Narrative   She is currently unemployed.  She was terminated on 06/15/2013.  She was previously a Loss adjuster, chartered, worked at [employer], and worked in Charity fundraiser for 25 years.   She lives with husband.  They have two children.   Social Determinants of Health   Financial Resource Strain: Not on file  Food Insecurity: Not on file  Transportation Needs: Not on file  Physical Activity: Not on file  Stress: Not on file  Social Connections: Not on file    Family History  Problem Relation Age of Onset   Heart failure Father        Died, 61   Hypertension Mother        Living, 3   Breast cancer Mother    Kidney cancer Mother    Hypercholesterolemia Son    Breast cancer Sister    Pancreatic cancer Brother        passed age 65   Hypertension Sister     Colon cancer Neg Hx     Outpatient Encounter Medications as of 06/02/2021  Medication Sig   albuterol (PROVENTIL HFA;VENTOLIN HFA) 108 (90 Base) MCG/ACT inhaler Inhale 2 puffs into the lungs every 6 (six) hours as needed for wheezing or shortness of breath.   Artificial Tear Solution (SOOTHE XP) SOLN Apply 1 drop to eye daily as needed (dry eyes).   celecoxib (CELEBREX) 200 MG capsule Take 200 mg by mouth daily as needed for moderate pain.   Cholecalciferol (VITAMIN D3) 5000 units CAPS Take 5,000 Units by mouth daily.   dicyclomine (BENTYL) 10 MG capsule Take 1 capsule (10 mg total) by mouth 4 (four) times daily as needed.   Dulaglutide (TRULICITY) 4.5 WJ/1.9JY SOPN Inject 4.5 mg as directed once a week.   DULoxetine (CYMBALTA) 60 MG capsule Take 60 mg daily by mouth.    fluticasone (FLONASE) 50 MCG/ACT nasal spray Place 2 sprays into both nostrils daily as needed for allergies or rhinitis.   furosemide (LASIX) 20 MG tablet Take 20 mg by mouth daily as needed for fluid.   gabapentin (NEURONTIN) 300 MG capsule Take 600 mg by mouth 3 (three) times daily.   insulin degludec (TRESIBA FLEXTOUCH) 100 UNIT/ML FlexTouch Pen Inject 55 Units into the skin at bedtime.   Lancets (ONETOUCH DELICA PLUS [id number]) MISC Apply topically.   losartan (COZAAR) 25 MG tablet TAKE ONE TABLET BY MOUTH ONCE DAILY   Multiple Vitamin (MULTIVITAMIN WITH MINERALS) TABS tablet Take 1 tablet by mouth daily.   NYAMYC powder Apply 1 application topically daily as needed (yeast).   ONETOUCH ULTRA test strip 1 each 3 (three) times daily.   pantoprazole (PROTONIX) 40 MG tablet TAKE ONE TABLET BY MOUTH DAILY 30 MINUTES PRIOR TO BREAKFAST.   rOPINIRole (REQUIP) 0.5 MG tablet Take 1 mg by mouth at bedtime.   rosuvastatin (CRESTOR) 10 MG tablet Take 1 tablet (10 mg total) by mouth daily.   SURE COMFORT PEN NEEDLES 31G X 8 MM MISC USE DAILY WITH TRESIBA.   traMADol (ULTRAM) 50 MG tablet Take 100 mg by mouth every 6 (six) hours as  needed for moderate pain.   zolpidem (AMBIEN) 10 MG tablet Take 10 mg by mouth at bedtime.   [DISCONTINUED] CLENPIQ 10-3.5-12 MG-GM -GM/160ML SOLN Take by mouth. (Patient not taking: Reported on 06/02/2021)   [DISCONTINUED] rifaximin (XIFAXAN) 550 MG TABS tablet Take 1 tablet (550 mg total) by mouth 3 (three) times  daily for 14 days. (Patient not taking: No sig reported)   No facility-administered encounter medications on file as of 06/02/2021.    ALLERGIES: No Known Allergies  VACCINATION STATUS:  There is no immunization history on file for this patient.  Diabetes She presents for her follow-up diabetic visit. She has type 2 diabetes mellitus. Onset time: She was diagnosed at approximate age of 46 years. Her disease course has been worsening. There are no hypoglycemic associated symptoms. Pertinent negatives for hypoglycemia include no confusion, headaches, pallor or seizures. Associated symptoms include fatigue. Pertinent negatives for diabetes include no chest pain, no polydipsia, no polyphagia and no polyuria. There are no hypoglycemic complications. Symptoms are stable. There are no diabetic complications. Risk factors for coronary artery disease include diabetes mellitus, obesity, sedentary lifestyle, post-menopausal, tobacco exposure, dyslipidemia and hypertension. Current diabetic treatment includes insulin injections. She is compliant with treatment most of the time. Her weight is fluctuating minimally. She is following a generally unhealthy diet. When asked about meal planning, she reported none. She has not had a previous visit with a dietitian. She rarely participates in exercise. Her home blood glucose trend is fluctuating minimally. Her overall blood glucose range is 140-180 mg/dl. (She presents today with her meter, no logs, showing above target fasting glycemic profile.  Her POCT A1c today is 7.4%, increasing from last visit of 6.7%.  She does report being on oral steroids which ran  her readings up over 300 for several days for a sinus infection.  She also admits to eating not so healthy things as her kitchen is currently being remodeled.  She denies any hypoglycemia.) An ACE inhibitor/angiotensin II receptor blocker is not being taken. She does not see a podiatrist.Eye exam is current.  Hyperlipidemia This is a chronic problem. The current episode started more than 1 year ago. The problem is controlled. Recent lipid tests were reviewed and are normal. Exacerbating diseases include chronic renal disease, diabetes and obesity. Factors aggravating her hyperlipidemia include fatty foods. Pertinent negatives include no chest pain. Current antihyperlipidemic treatment includes statins. The current treatment provides significant improvement of lipids. Compliance problems include adherence to exercise and adherence to diet.  Risk factors for coronary artery disease include diabetes mellitus, dyslipidemia, obesity, a sedentary lifestyle, post-menopausal and hypertension.  Hypertension This is a chronic problem. The current episode started more than 1 year ago. The problem has been gradually improving since onset. The problem is controlled. Pertinent negatives include no chest pain or headaches. There are no associated agents to hypertension. Risk factors for coronary artery disease include diabetes mellitus, dyslipidemia, obesity, post-menopausal state and sedentary lifestyle. Past treatments include angiotensin blockers and diuretics. The current treatment provides moderate improvement. There are no compliance problems.  Hypertensive end-organ damage includes kidney disease. Identifiable causes of hypertension include chronic renal disease and sleep apnea.    Review of systems  Constitutional: + Minimally fluctuating body weight,  current Body mass index is 39 kg/m. , no fatigue, no subjective hyperthermia, no subjective hypothermia Eyes: no blurry vision, no xerophthalmia ENT: no sore  throat, no nodules palpated in throat, no dysphagia/odynophagia, no hoarseness Cardiovascular: no chest pain, no shortness of breath, no palpitations, no leg swelling Respiratory: no cough, no shortness of breath Gastrointestinal: no nausea/vomiting/diarrhea, + bloating Musculoskeletal: no muscle/joint aches Skin: no rashes, no hyperemia Neurological: no tremors, no numbness, no tingling, no dizziness Psychiatric: no depression, no anxiety    Objective:    BP 123/85   Pulse 91   Ht 5\' 1"  (  1.549 m)   Wt 206 lb 6.4 oz (93.6 kg)   BMI 39.00 kg/m   Wt Readings from Last 3 Encounters:  06/02/21 206 lb 6.4 oz (93.6 kg)  05/19/21 206 lb 9.6 oz (93.7 kg)  01/29/21 206 lb (93.4 kg)    BP Readings from Last 3 Encounters:  06/02/21 123/85  05/19/21 135/67  01/29/21 (!) 150/60     Physical Exam- Limited  Constitutional:  Body mass index is 39 kg/m. , not in acute distress, normal state of mind Eyes:  EOMI, no exophthalmos Neck: Supple Cardiovascular: RRR, no murmurs, rubs, or gallops, no edema Respiratory: Adequate breathing efforts, no crackles, rales, rhonchi, or wheezing Musculoskeletal: no gross deformities, strength intact in all four extremities, no gross restriction of joint movements Skin:  no rashes, no hyperemia Neurological: no tremor with outstretched hands    CMP ( most recent) CMP     Component Value Date/Time   NA 135 05/27/2021 1345   K 4.6 05/27/2021 1345   CL 98 05/27/2021 1345   CO2 22 05/27/2021 1345   GLUCOSE 144 (H) 05/27/2021 1345   GLUCOSE 185 (H) 07/27/2017 0916   BUN 15 05/27/2021 1345   CREATININE 0.89 05/27/2021 1345   CALCIUM 9.2 05/27/2021 1345   PROT 7.2 05/27/2021 1345   ALBUMIN 4.3 05/27/2021 1345   AST 18 05/27/2021 1345   ALT 9 05/27/2021 1345   ALKPHOS 81 05/27/2021 1345   BILITOT 0.6 05/27/2021 1345   GFRNONAA 75 09/25/2020 0000   GFRAA 87 09/25/2020 0000    Lipid Panel     Component Value Date/Time   CHOL 110 09/25/2020  0000   CHOL 104 05/16/2020 1002   TRIG 145 09/25/2020 0000   HDL 47 09/25/2020 0000   HDL 47 05/16/2020 1002   LDLCALC 38 09/25/2020 0000   LDLCALC 37 05/16/2020 1002   LABVLDL 20 05/16/2020 1002      Assessment & Plan:   1) Uncontrolled type 2 diabetes mellitus with hyperglycemia ([place])  - TIMIKA MUENCH has currently uncontrolled symptomatic type 2 DM since 60 years of age.  She presents today with her meter, no logs, showing above target fasting glycemic profile.  Her POCT A1c today is 7.4%, increasing from last visit of 6.7%.  She does report being on oral steroids which ran her readings up over 300 for several days for a sinus infection.  She also admits to eating not so healthy things as her kitchen is currently being remodeled.  She denies any hypoglycemia.  -her diabetes is complicated by obesity/sedentary life and she remains at a high risk for more acute and chronic complications which include CAD, CVA, CKD, retinopathy, and neuropathy. These are all discussed in detail with her.  - Nutritional counseling repeated at each appointment due to patients tendency to fall back in to old habits.  - The patient admits there is a room for improvement in their diet and drink choices. -  Suggestion is made for the patient to avoid simple carbohydrates from their diet including Cakes, Sweet Desserts / Pastries, Ice Cream, Soda (diet and regular), Sweet Tea, Candies, Chips, Cookies, Sweet Pastries, Store Bought Juices, Alcohol in Excess of 1-2 drinks a day, Artificial Sweeteners, Coffee Creamer, and "Sugar-free" Products. This will help patient to have stable blood glucose profile and potentially avoid unintended weight gain.   - I encouraged the patient to switch to unprocessed or minimally processed complex starch and increased protein intake (animal or plant source), fruits, and vegetables.   -  Patient is advised to stick to a routine mealtimes to eat 3 meals a day and avoid unnecessary  snacks (to snack only to correct hypoglycemia).  - she will be scheduled with Jearld Fenton, RDN, CDE for individualized diabetes education.  - I have approached her with the following individualized plan to manage diabetes and patient agrees:   -Based on her presenting glycemic profile, she will not need prandial insulin for now.    -Although her A1c is slightly worse today, her glucose readings are trending downward, therefore she will not tolerate any increase in her [place].  She is advised to continue Tresiba 45 units nightly and Trulicity 4.5 mg SQ weekly.  -She did not tolerate Metformin in the past, even in the ER form.  -She is encouraged to consistently monitor glucose twice daily, before breakfast and before bed and call the clinic if she has readings less than 70 or greater than 200 for 3 tests in a row.  - Patient specific target  A1c;  LDL, HDL, Triglycerides,  were discussed in detail.  2) Blood Pressure /Hypertension: Her blood pressure is controlled to target.  She is advised to continue Lasix 40 mg po daily prn for fluid.  She is advised to continue Losartan 25 mg po daily.  3) Lipids/Hyperlipidemia:   Her most recent lipid panel from 09/25/20 shows controlled LDL of 38.  She is advised to continue Crestor 10 mg po daily before bed.  Side effects and precautions discussed with her.   4)  Weight/Diet:  Her Body mass index is 39 kg/m.-   clearly complicating her diabetes care.  She is a candidate for modest weight loss.  I discussed with her the fact that loss of 5 - 10% of her  current body weight will have the most impact on her diabetes management.  CDE Consult will be initiated . Exercise, and detailed carbohydrates information provided  -  detailed on discharge instructions.  5) Chronic Care/Health Maintenance: -she on ARB and Statin medication and is encouraged to initiate and continue to follow up with Ophthalmology, Dentist, Podiatrist at least yearly or according to  recommendations, and advised to stay away from smoking. I have recommended yearly flu vaccine and pneumonia vaccine at least every 5 years; moderate intensity exercise for up to 150 minutes weekly; and  sleep for at least 7 hours a day.  - she is advised to maintain close follow up with [name], MD for primary care needs, as well as her other providers for optimal and coordinated care.       I spent 30 minutes in the care of the patient today including review of labs from [facility], Lipids, Thyroid Function, Hematology (current and previous including abstractions from other facilities); face-to-face time discussing  her blood glucose readings/logs, discussing hypoglycemia and hyperglycemia episodes and symptoms, medications doses, her options of short and long term treatment based on the latest standards of care / guidelines;  discussion about incorporating lifestyle medicine;  and documenting the encounter.    Please refer to Patient Instructions for Blood Glucose Monitoring and Insulin/Medications Dosing Guide"  in media tab for additional information. Please  also refer to " Patient Self Inventory" in the Media  tab for reviewed elements of pertinent patient history.  Allena Katz participated in the discussions, expressed understanding, and voiced agreement with the above plans.  All questions were answered to her satisfaction. she is encouraged to contact clinic should she have any questions or concerns prior to  her return visit.    Follow up plan: - Return in about 4 months (around 10/03/2021) for Diabetes F/U- A1c and UM in office, No previsit labs, Bring meter and logs.   Rayetta Pigg, [facility] [address] [place], [place] [address] Phone: [phone] Fax: [phone]  06/02/2021, 1:30 PM

## 2021-06-03 ENCOUNTER — Encounter (HOSPITAL_COMMUNITY)
Admission: RE | Admit: 2021-06-03 | Discharge: 2021-06-03 | Disposition: A | Discharge status: Home / Self Care | Payer: Medicare Other | Source: Ambulatory Visit | Attending: Internal Medicine | Admitting: Internal Medicine

## 2021-06-03 DIAGNOSIS — Z01818 Encounter for other preprocedural examination: Secondary | ICD-10-CM | POA: Diagnosis not present

## 2021-06-03 HISTORY — DX: Essential (primary) hypertension: I10

## 2021-06-05 ENCOUNTER — Encounter (HOSPITAL_COMMUNITY): Payer: Self-pay

## 2021-06-05 ENCOUNTER — Ambulatory Visit (HOSPITAL_COMMUNITY): Payer: Medicare Other | Admitting: Anesthesiology

## 2021-06-05 ENCOUNTER — Other Ambulatory Visit: Payer: Self-pay

## 2021-06-05 ENCOUNTER — Ambulatory Visit (HOSPITAL_COMMUNITY)
Admission: RE | Admit: 2021-06-05 | Discharge: 2021-06-05 | Disposition: A | Discharge status: Home / Self Care | Payer: Medicare Other | Attending: Internal Medicine | Admitting: Internal Medicine

## 2021-06-05 ENCOUNTER — Encounter (HOSPITAL_COMMUNITY)
Admission: RE | Disposition: A | Discharge status: Home / Self Care | Payer: Self-pay | Source: Home / Self Care | Attending: Internal Medicine

## 2021-06-05 DIAGNOSIS — Z7985 Long-term (current) use of injectable non-insulin antidiabetic drugs: Secondary | ICD-10-CM | POA: Insufficient documentation

## 2021-06-05 DIAGNOSIS — K648 Other hemorrhoids: Secondary | ICD-10-CM | POA: Insufficient documentation

## 2021-06-05 DIAGNOSIS — Z791 Long term (current) use of non-steroidal anti-inflammatories (NSAID): Secondary | ICD-10-CM | POA: Insufficient documentation

## 2021-06-05 DIAGNOSIS — M1990 Unspecified osteoarthritis, unspecified site: Secondary | ICD-10-CM | POA: Diagnosis not present

## 2021-06-05 DIAGNOSIS — Z87891 Personal history of nicotine dependence: Secondary | ICD-10-CM | POA: Diagnosis not present

## 2021-06-05 DIAGNOSIS — Z800 Family history of malignant neoplasm of digestive organs: Secondary | ICD-10-CM | POA: Insufficient documentation

## 2021-06-05 DIAGNOSIS — Z8616 Personal history of COVID-19: Secondary | ICD-10-CM | POA: Diagnosis not present

## 2021-06-05 DIAGNOSIS — R140 Abdominal distension (gaseous): Secondary | ICD-10-CM | POA: Diagnosis not present

## 2021-06-05 DIAGNOSIS — K269 Duodenal ulcer, unspecified as acute or chronic, without hemorrhage or perforation: Secondary | ICD-10-CM | POA: Diagnosis not present

## 2021-06-05 DIAGNOSIS — D124 Benign neoplasm of descending colon: Secondary | ICD-10-CM | POA: Insufficient documentation

## 2021-06-05 DIAGNOSIS — Z7901 Long term (current) use of anticoagulants: Secondary | ICD-10-CM | POA: Insufficient documentation

## 2021-06-05 DIAGNOSIS — K2970 Gastritis, unspecified, without bleeding: Secondary | ICD-10-CM

## 2021-06-05 DIAGNOSIS — K5730 Diverticulosis of large intestine without perforation or abscess without bleeding: Secondary | ICD-10-CM | POA: Diagnosis not present

## 2021-06-05 DIAGNOSIS — Z1211 Encounter for screening for malignant neoplasm of colon: Secondary | ICD-10-CM | POA: Insufficient documentation

## 2021-06-05 DIAGNOSIS — Z9049 Acquired absence of other specified parts of digestive tract: Secondary | ICD-10-CM | POA: Diagnosis not present

## 2021-06-05 DIAGNOSIS — R1013 Epigastric pain: Secondary | ICD-10-CM | POA: Insufficient documentation

## 2021-06-05 DIAGNOSIS — Z794 Long term (current) use of insulin: Secondary | ICD-10-CM | POA: Insufficient documentation

## 2021-06-05 DIAGNOSIS — K635 Polyp of colon: Secondary | ICD-10-CM | POA: Diagnosis not present

## 2021-06-05 DIAGNOSIS — K449 Diaphragmatic hernia without obstruction or gangrene: Secondary | ICD-10-CM | POA: Diagnosis not present

## 2021-06-05 DIAGNOSIS — Z79899 Other long term (current) drug therapy: Secondary | ICD-10-CM | POA: Diagnosis not present

## 2021-06-05 DIAGNOSIS — Z86010 Personal history of colonic polyps: Secondary | ICD-10-CM

## 2021-06-05 DIAGNOSIS — K222 Esophageal obstruction: Secondary | ICD-10-CM | POA: Diagnosis not present

## 2021-06-05 DIAGNOSIS — K2950 Unspecified chronic gastritis without bleeding: Secondary | ICD-10-CM | POA: Diagnosis not present

## 2021-06-05 DIAGNOSIS — K219 Gastro-esophageal reflux disease without esophagitis: Secondary | ICD-10-CM | POA: Insufficient documentation

## 2021-06-05 DIAGNOSIS — E119 Type 2 diabetes mellitus without complications: Secondary | ICD-10-CM | POA: Diagnosis not present

## 2021-06-05 DIAGNOSIS — R1310 Dysphagia, unspecified: Secondary | ICD-10-CM | POA: Diagnosis not present

## 2021-06-05 HISTORY — PX: BIOPSY: SHX5522

## 2021-06-05 HISTORY — PX: POLYPECTOMY: SHX5525

## 2021-06-05 HISTORY — PX: COLONOSCOPY WITH PROPOFOL: SHX5780

## 2021-06-05 HISTORY — PX: BALLOON DILATION: SHX5330

## 2021-06-05 HISTORY — PX: ESOPHAGOGASTRODUODENOSCOPY (EGD) WITH PROPOFOL: SHX5813

## 2021-06-05 LAB — GLUCOSE, CAPILLARY: Glucose-Capillary: 94 mg/dL (ref 70–99)

## 2021-06-05 SURGERY — COLONOSCOPY WITH PROPOFOL
Anesthesia: General

## 2021-06-05 MED ORDER — PROPOFOL 10 MG/ML IV BOLUS
INTRAVENOUS | Status: DC | PRN
  Administered 2021-06-05 (×2): 100 mg via INTRAVENOUS
  Administered 2021-06-05: 50 mg via INTRAVENOUS

## 2021-06-05 MED ORDER — STERILE WATER FOR IRRIGATION IR SOLN: Status: DC | PRN

## 2021-06-05 MED ORDER — PANTOPRAZOLE SODIUM 40 MG PO TBEC: 40.0000 mg | DELAYED_RELEASE_TABLET | Freq: Two times a day (BID) | ORAL | 3 refills | Status: DC

## 2021-06-05 MED ORDER — LACTATED RINGERS IV SOLN: INTRAVENOUS | Status: DC

## 2021-06-05 MED ORDER — PROPOFOL 500 MG/50ML IV EMUL
INTRAVENOUS | Status: DC | PRN
  Administered 2021-06-05: 75 ug/kg/min via INTRAVENOUS

## 2021-06-05 MED ORDER — LIDOCAINE HCL (CARDIAC) PF 50 MG/5ML IV SOSY
PREFILLED_SYRINGE | INTRAVENOUS | Status: DC | PRN
  Administered 2021-06-05: 50 mg via INTRAVENOUS

## 2021-06-05 NOTE — Anesthesia Procedure Notes (Signed)
Date/Time: 06/05/2021 7:32 AM Performed by: Vista Deck, CRNA Pre-anesthesia Checklist: Patient identified, Emergency Drugs available, Suction available, Timeout performed and Patient being monitored Patient Re-evaluated:Patient Re-evaluated prior to induction Oxygen Delivery Method: Nasal Cannula

## 2021-06-05 NOTE — Discharge Instructions (Addendum)
EGD Discharge instructions Please read the instructions outlined below and refer to this sheet in the next few weeks. These discharge instructions provide you with general information on caring for yourself after you leave the hospital. Your doctor may also give you specific instructions. While your treatment has been planned according to the most current medical practices available, unavoidable complications occasionally occur. If you have any problems or questions after discharge, please call your doctor. ACTIVITY You may resume your regular activity but move at a slower pace for the next 24 hours.  Take frequent rest periods for the next 24 hours.  Walking will help expel (get rid of) the air and reduce the bloated feeling in your abdomen.  No driving for 24 hours (because of the anesthesia (medicine) used during the test).  You may shower.  Do not sign any important legal documents or operate any machinery for 24 hours (because of the anesthesia used during the test).  NUTRITION Drink plenty of fluids.  You may resume your normal diet.  Begin with a light meal and progress to your normal diet.  Avoid alcoholic beverages for 24 hours or as instructed by your caregiver.  MEDICATIONS You may resume your normal medications unless your caregiver tells you otherwise.  WHAT YOU CAN EXPECT TODAY You may experience abdominal discomfort such as a feeling of fullness or "gas" pains.  FOLLOW-UP Your doctor will discuss the results of your test with you.  SEEK IMMEDIATE MEDICAL ATTENTION IF ANY OF THE FOLLOWING OCCUR: Excessive nausea (feeling sick to your stomach) and/or vomiting.  Severe abdominal pain and distention (swelling).  Trouble swallowing.  Temperature over 101 F (37.8 C).  Rectal bleeding or vomiting of blood.     Colonoscopy Discharge Instructions  Read the instructions outlined below and refer to this sheet in the next few weeks. These discharge instructions provide you with  general information on caring for yourself after you leave the hospital. Your doctor may also give you specific instructions. While your treatment has been planned according to the most current medical practices available, unavoidable complications occasionally occur.   ACTIVITY You may resume your regular activity, but move at a slower pace for the next 24 hours.  Take frequent rest periods for the next 24 hours.  Walking will help get rid of the air and reduce the bloated feeling in your belly (abdomen).  No driving for 24 hours (because of the medicine (anesthesia) used during the test).   Do not sign any important legal documents or operate any machinery for 24 hours (because of the anesthesia used during the test).  NUTRITION Drink plenty of fluids.  You may resume your normal diet as instructed by your doctor.  Begin with a light meal and progress to your normal diet. Heavy or fried foods are harder to digest and may make you feel sick to your stomach (nauseated).  Avoid alcoholic beverages for 24 hours or as instructed.  MEDICATIONS You may resume your normal medications unless your doctor tells you otherwise.  WHAT YOU CAN EXPECT TODAY Some feelings of bloating in the abdomen.  Passage of more gas than usual.  Spotting of blood in your stool or on the toilet paper.  IF YOU HAD POLYPS REMOVED DURING THE COLONOSCOPY: No aspirin products for 7 days or as instructed.  No alcohol for 7 days or as instructed.  Eat a soft diet for the next 24 hours.  FINDING OUT THE RESULTS OF YOUR TEST Not all test results are  available during your visit. If your test results are not back during the visit, make an appointment with your caregiver to find out the results. Do not assume everything is normal if you have not heard from your caregiver or the medical facility. It is important for you to follow up on all of your test results.  SEEK IMMEDIATE MEDICAL ATTENTION IF: You have more than a spotting of  blood in your stool.  Your belly is swollen (abdominal distention).  You are nauseated or vomiting.  You have a temperature over 101.  You have abdominal pain or discomfort that is severe or gets worse throughout the day.   Your EGD revealed mild amount inflammation in your stomach and small bowel.  I took biopsies of this to rule out infection with a bacteria called H. pylori.  Await pathology results, my office will contact you. You did have a slight narrowing of your esophagus so I stretched this with a balloon. Hopefully this improves your swallowing. I am going to increase your pantoprazole to twice daily for the next 12 weeks. Avoid NSAIDs as best as you can.   Your colonoscopy revealed 1 polyp(s) which I removed successfully. Await pathology results, my office will contact you. I recommend repeating colonoscopy in 5 years for surveillance purposes.   Follow up with GI in 4 months.   I hope you have a great rest of your week!  Elon Alas. Abbey Chatters, D.O. Gastroenterology and Hepatology [facility]

## 2021-06-05 NOTE — Anesthesia Postprocedure Evaluation (Signed)
Anesthesia Post Note  Patient: VERONDA GABOR  Procedure(s) Performed: COLONOSCOPY WITH PROPOFOL ESOPHAGOGASTRODUODENOSCOPY (EGD) WITH PROPOFOL BALLOON DILATION BIOPSY POLYPECTOMY  Patient location during evaluation: Phase II Anesthesia Type: General Level of consciousness: awake and alert and oriented Pain management: pain level controlled Vital Signs Assessment: post-procedure vital signs reviewed and stable Respiratory status: spontaneous breathing and respiratory function stable Cardiovascular status: blood pressure returned to baseline and stable Postop Assessment: no apparent nausea or vomiting Anesthetic complications: no   No notable events documented.   Last Vitals:  Vitals:   06/05/21 0635 06/05/21 0810  BP: 137/66 104/70  Pulse: 62 70  Resp: 16 16  Temp: 36.9 C 36.9 C  SpO2: 99% 96%    Last Pain:  Vitals:   06/05/21 0810  TempSrc: Oral  PainSc: 3                  Luceal Hollibaugh C Makar Slatter

## 2021-06-05 NOTE — Anesthesia Preprocedure Evaluation (Signed)
Anesthesia Evaluation  Patient identified by MRN, date of birth, ID band Patient awake    Reviewed: Allergy & Precautions, NPO status , Patient's Chart, lab work & pertinent test results  History of Anesthesia Complications (+) PONV and history of anesthetic complications  Airway Mallampati: II  TM Distance: >3 FB Neck ROM: Full    Dental  (+) Dental Advisory Given, Teeth Intact   Pulmonary shortness of breath and with exertion, sleep apnea , pneumonia (covid - 08/2020), resolved, neg COPD,  COPD inhaler, former smoker,    Pulmonary exam normal breath sounds clear to auscultation       Cardiovascular hypertension, Pt. on medications + DOE  Normal cardiovascular exam Rhythm:Regular Rate:Normal     Neuro/Psych PSYCHIATRIC DISORDERS Depression  Neuromuscular disease    GI/Hepatic Neg liver ROS, GERD  Medicated and Controlled,  Endo/Other  negative endocrine ROSdiabetes, Well Controlled, Type 2, Oral Hypoglycemic Agents  Renal/GU negative Renal ROS  negative genitourinary   Musculoskeletal  (+) Arthritis , Osteoarthritis,  Fibromyalgia -, narcotic dependent  Abdominal   Peds negative pediatric ROS (+)  Hematology negative hematology ROS (+)   Anesthesia Other Findings   Reproductive/Obstetrics negative OB ROS                           Anesthesia Physical Anesthesia Plan  ASA: 3  Anesthesia Plan: General   Post-op Pain Management:    Induction: Intravenous  PONV Risk Score and Plan: TIVA  Airway Management Planned: Nasal Cannula and Natural Airway  Additional Equipment:   Intra-op Plan:   Post-operative Plan:   Informed Consent: I have reviewed the patients History and Physical, chart, labs and discussed the procedure including the risks, benefits and alternatives for the proposed anesthesia with the patient or authorized representative who has indicated his/her understanding and  acceptance.     Dental advisory given  Plan Discussed with: CRNA and Surgeon  Anesthesia Plan Comments:         Anesthesia Quick Evaluation

## 2021-06-05 NOTE — Interval H&P Note (Signed)
History and Physical Interval Note:  06/05/2021 7:32 AM  Erica Price  has presented today for surgery, with the diagnosis of history of colon polyps, dysphagia, epigastric pain, GERD.  The various methods of treatment have been discussed with the patient and family. After consideration of risks, benefits and other options for treatment, the patient has consented to  Procedure(s) with comments: COLONOSCOPY WITH PROPOFOL (N/A) - 7:30am ESOPHAGOGASTRODUODENOSCOPY (EGD) WITH PROPOFOL (N/A) BALLOON DILATION (N/A) as a surgical intervention.  The patient's history has been reviewed, patient examined, no change in status, stable for surgery.  I have reviewed the patient's chart and labs.  Questions were answered to the patient's satisfaction.     Eloise Harman

## 2021-06-05 NOTE — Transfer of Care (Signed)
Immediate Anesthesia Transfer of Care Note  Patient: Erica Price  Procedure(s) Performed: COLONOSCOPY WITH PROPOFOL ESOPHAGOGASTRODUODENOSCOPY (EGD) WITH PROPOFOL BALLOON DILATION BIOPSY POLYPECTOMY  Patient Location: Short Stay  Anesthesia Type:General  Level of Consciousness: awake, alert  and patient cooperative  Airway & Oxygen Therapy: Patient Spontanous Breathing  Post-op Assessment: Report given to RN and Post -op Vital signs reviewed and stable  Post vital signs: Reviewed and stable  Last Vitals:  Vitals Value Taken Time  BP    Temp    Pulse    Resp    SpO2      Last Pain:  Vitals:   06/05/21 0734  TempSrc:   PainSc: 0-No pain         Complications: No notable events documented.

## 2021-06-05 NOTE — Op Note (Signed)
[facility] Patient Name: Erica Price Procedure Date: 06/05/2021 7:45 AM MRN: [id number] Date of Birth: [birth year] Attending MD: Elon Alas. Abbey Chatters DO CSN: [id number] Age: 60 Admit Type: Outpatient Procedure:                Colonoscopy Indications:              Surveillance: Personal history of adenomatous                            polyps on last colonoscopy 3 years ago Providers:                Elon Alas. Abbey Chatters, DO, Tammy Vaught, RN, Nelma Rothman,                            Technician Referring MD:              Medicines:                See the Anesthesia note for documentation of the                            administered medications Complications:            No immediate complications. Estimated Blood Loss:     Estimated blood loss was minimal. Procedure:                Pre-Anesthesia Assessment:                           - The anesthesia plan was to use monitored                            anesthesia care (MAC).                           After obtaining informed consent, the colonoscope                            was passed under direct vision. Throughout the                            procedure, the patient's blood pressure, pulse, and                            oxygen saturations were monitored continuously. The                            [id number]-HQ190L ([id number]) scope was introduced through                            the anus and advanced to the the terminal ileum,                            with identification of the appendiceal orifice and                            IC valve. The colonoscopy was  performed without                            difficulty. The patient tolerated the procedure                            well. The quality of the bowel preparation was                            evaluated using the BBPS [facility] Bowel Preparation                            Scale) with scores of: Right Colon = 2 (minor                            amount of residual staining, small  fragments of                            stool and/or opaque liquid, but mucosa seen well),                            Transverse Colon = 2 (minor amount of residual                            staining, small fragments of stool and/or opaque                            liquid, but mucosa seen well) and Left Colon = 2                            (minor amount of residual staining, small fragments                            of stool and/or opaque liquid, but mucosa seen                            well). The total BBPS score equals 6. The quality                            of the bowel preparation was fair. Scope In: 7:49:58 AM Scope Out: 8:02:40 AM Scope Withdrawal Time: 0 hours 8 minutes 39 seconds  Total Procedure Duration: 0 hours 12 minutes 42 seconds  Findings:      The perianal and digital rectal examinations were normal.      Non-bleeding internal hemorrhoids were found during endoscopy.      Multiple small-mouthed diverticula were found in the sigmoid colon.      A 7 mm polyp was found in the descending colon. The polyp was sessile.       The polyp was removed with a cold snare. Resection and retrieval were       complete.      The terminal ileum appeared normal. Impression:               - Preparation of the colon was fair.                           -  Non-bleeding internal hemorrhoids.                           - Diverticulosis in the sigmoid colon.                           - One 7 mm polyp in the descending colon, removed                            with a cold snare. Resected and retrieved.                           - The examined portion of the ileum was normal. Moderate Sedation:      Per Anesthesia Care Recommendation:           - Patient has a contact number available for                            emergencies. The signs and symptoms of potential                            delayed complications were discussed with the                            patient. Return to normal  activities tomorrow.                            Written discharge instructions were provided to the                            patient.                           - Continue present medications.                           - Resume previous diet.                           - Repeat colonoscopy in 5 years for surveillance.                           - Return to GI clinic in 4 months. Procedure Code(s):        --- Professional ---                           [phone], Colonoscopy, flexible; with removal of                            tumor(s), polyp(s), or other lesion(s) by snare                            technique Diagnosis Code(s):        --- Professional ---  Z86.010, Personal history of colonic polyps                           K64.8, Other hemorrhoids                           K63.5, Polyp of colon                           K57.30, Diverticulosis of large intestine without                            perforation or abscess without bleeding CPT copyright 2019 American Medical Association. All rights reserved. The codes documented in this report are preliminary and upon coder review may  be revised to meet current compliance requirements. Elon Alas. Abbey Chatters, DO [place] Abbey Chatters, DO 06/05/2021 8:05:29 AM This report has been signed electronically. Number of Addenda: 0

## 2021-06-05 NOTE — Op Note (Signed)
[facility] Patient Name: Erica Price Procedure Date: 06/05/2021 7:23 AM MRN: [id number] Date of Birth: 1961/08/17 Attending MD: Elon Alas. Edgar Frisk CSN: [id number] Age: 60 Admit Type: Outpatient Procedure:                Upper GI endoscopy Indications:              Epigastric abdominal pain, Dysphagia Providers:                Elon Alas. Abbey Chatters, DO, Tammy Vaught, RN, Nelma Rothman,                            Technician Referring MD:              Medicines:                See the Anesthesia note for documentation of the                            administered medications Complications:            No immediate complications. Estimated Blood Loss:     Estimated blood loss was minimal. Procedure:                Pre-Anesthesia Assessment:                           - The anesthesia plan was to use monitored                            anesthesia care (MAC).                           After obtaining informed consent, the endoscope was                            passed under direct vision. Throughout the                            procedure, the patient's blood pressure, pulse, and                            oxygen saturations were monitored continuously. The                            [id number]-[id number] ([id number]) scope was introduced through the                            mouth, and advanced to the second part of duodenum.                            The upper GI endoscopy was accomplished without                            difficulty. The patient tolerated the procedure                            well. Scope In: 7:39:06  AM Scope Out: 7:44:38 AM Total Procedure Duration: 0 hours 5 minutes 32 seconds  Findings:      A small hiatal hernia was present.      A mild Schatzki ring was found in the distal esophagus. A TTS dilator       was passed through the scope. Dilation with an 18-19-20 mm balloon       dilator was performed to 20 mm. The dilation site was examined and       showed moderate  improvement in luminal narrowing.      Localized mild inflammation characterized by erythema was found in the       gastric antrum. Biopsies were taken with a cold forceps for Helicobacter       pylori testing.      Two localized erosions without bleeding were found in the second portion       of the duodenum. Biopsies were taken with a cold forceps for histology. Impression:               - Small hiatal hernia.                           - Mild Schatzki ring. Dilated.                           - Gastritis. Biopsied.                           - Duodenal erosions without bleeding. Biopsied. Moderate Sedation:      Per Anesthesia Care Recommendation:           - Patient has a contact number available for                            emergencies. The signs and symptoms of potential                            delayed complications were discussed with the                            patient. Return to normal activities tomorrow.                            Written discharge instructions were provided to the                            patient.                           - Resume previous diet.                           - Continue present medications.                           - Await pathology results.                           - Repeat upper endoscopy PRN for retreatment.                           -  Return to GI clinic in 4 months.                           - Use a proton pump inhibitor PO BID. Procedure Code(s):        --- Professional ---                           [phone], Esophagogastroduodenoscopy, flexible,                            transoral; with transendoscopic balloon dilation of                            esophagus (less than 30 mm diameter)                           43239, 59, Esophagogastroduodenoscopy, flexible,                            transoral; with biopsy, single or multiple Diagnosis Code(s):        --- Professional ---                           K44.9, Diaphragmatic hernia  without obstruction or                            gangrene                           K22.2, Esophageal obstruction                           K29.70, Gastritis, unspecified, without bleeding                           K26.9, Duodenal ulcer, unspecified as acute or                            chronic, without hemorrhage or perforation                           R10.13, Epigastric pain                           R13.10, Dysphagia, unspecified CPT copyright 2019 American Medical Association. All rights reserved. The codes documented in this report are preliminary and upon coder review may  be revised to meet current compliance requirements. Elon Alas. Abbey Chatters, DO [place] Keshona Kartes, DO 06/05/2021 8:03:13 AM This report has been signed electronically. Number of Addenda: 0

## 2021-06-09 LAB — SURGICAL PATHOLOGY

## 2021-06-10 ENCOUNTER — Encounter (HOSPITAL_COMMUNITY): Payer: Self-pay | Admitting: Internal Medicine

## 2021-06-23 DIAGNOSIS — I10 Essential (primary) hypertension: Secondary | ICD-10-CM | POA: Diagnosis not present

## 2021-06-23 DIAGNOSIS — M1990 Unspecified osteoarthritis, unspecified site: Secondary | ICD-10-CM | POA: Diagnosis not present

## 2021-06-23 DIAGNOSIS — E1165 Type 2 diabetes mellitus with hyperglycemia: Secondary | ICD-10-CM | POA: Diagnosis not present

## 2021-06-23 DIAGNOSIS — E782 Mixed hyperlipidemia: Secondary | ICD-10-CM | POA: Diagnosis not present

## 2021-06-25 DIAGNOSIS — Z23 Encounter for immunization: Secondary | ICD-10-CM | POA: Diagnosis not present

## 2021-06-25 DIAGNOSIS — I10 Essential (primary) hypertension: Secondary | ICD-10-CM | POA: Diagnosis not present

## 2021-06-25 DIAGNOSIS — E782 Mixed hyperlipidemia: Secondary | ICD-10-CM | POA: Diagnosis not present

## 2021-06-25 DIAGNOSIS — G894 Chronic pain syndrome: Secondary | ICD-10-CM | POA: Diagnosis not present

## 2021-06-25 DIAGNOSIS — E1165 Type 2 diabetes mellitus with hyperglycemia: Secondary | ICD-10-CM | POA: Diagnosis not present

## 2021-06-25 DIAGNOSIS — J302 Other seasonal allergic rhinitis: Secondary | ICD-10-CM | POA: Diagnosis not present

## 2021-06-25 DIAGNOSIS — M1990 Unspecified osteoarthritis, unspecified site: Secondary | ICD-10-CM | POA: Diagnosis not present

## 2021-07-10 DIAGNOSIS — E1165 Type 2 diabetes mellitus with hyperglycemia: Secondary | ICD-10-CM | POA: Diagnosis not present

## 2021-07-10 DIAGNOSIS — G894 Chronic pain syndrome: Secondary | ICD-10-CM | POA: Diagnosis not present

## 2021-07-10 DIAGNOSIS — M1990 Unspecified osteoarthritis, unspecified site: Secondary | ICD-10-CM | POA: Diagnosis not present

## 2021-07-23 DIAGNOSIS — I10 Essential (primary) hypertension: Secondary | ICD-10-CM | POA: Diagnosis not present

## 2021-07-23 DIAGNOSIS — E782 Mixed hyperlipidemia: Secondary | ICD-10-CM | POA: Diagnosis not present

## 2021-07-23 DIAGNOSIS — E1165 Type 2 diabetes mellitus with hyperglycemia: Secondary | ICD-10-CM | POA: Diagnosis not present

## 2021-07-23 DIAGNOSIS — M1990 Unspecified osteoarthritis, unspecified site: Secondary | ICD-10-CM | POA: Diagnosis not present

## 2021-08-04 ENCOUNTER — Telehealth: Payer: Self-pay | Admitting: Nurse Practitioner

## 2021-08-04 MED ORDER — TRULICITY 1.5 MG/0.5ML ~~LOC~~ SOAJ: 1.5000 mg | SUBCUTANEOUS | 3 refills | Status: DC

## 2021-08-04 NOTE — Telephone Encounter (Signed)
Patient said she feels that you need to go ahead and decrease her Trulicity. She had her endoscopy and they did not find anything. They told her that her stomach was angry and they did increase her Protonix. She is still having issues with her stomach. They are suppose to mail her Trulicity out tomorrow so she said if you are going to decrease the prescription then a new RX should be sent in for her, please. Thank you.

## 2021-08-04 NOTE — Telephone Encounter (Signed)
Pt said that it needs to go through [facility] because they give her the medications for free. She said all of this paperwork in regards to this is on file here.

## 2021-08-04 NOTE — Telephone Encounter (Signed)
Please see updated information. Thank you!

## 2021-08-04 NOTE — Telephone Encounter (Signed)
Advised pt we would send in a Rx to [facility] with updated dose of trulicity.

## 2021-08-04 NOTE — Telephone Encounter (Signed)
Patient made aware.

## 2021-08-05 NOTE — Telephone Encounter (Signed)
Faxed Rx to [facility].

## 2021-08-05 NOTE — Telephone Encounter (Signed)
Pt called and said the updated fax number this medication needs to go to is [phone]

## 2021-08-22 DIAGNOSIS — I10 Essential (primary) hypertension: Secondary | ICD-10-CM | POA: Diagnosis not present

## 2021-08-22 DIAGNOSIS — E782 Mixed hyperlipidemia: Secondary | ICD-10-CM | POA: Diagnosis not present

## 2021-08-22 DIAGNOSIS — E1165 Type 2 diabetes mellitus with hyperglycemia: Secondary | ICD-10-CM | POA: Diagnosis not present

## 2021-08-22 DIAGNOSIS — M1990 Unspecified osteoarthritis, unspecified site: Secondary | ICD-10-CM | POA: Diagnosis not present

## 2021-08-28 ENCOUNTER — Telehealth: Payer: Self-pay

## 2021-08-28 DIAGNOSIS — M797 Fibromyalgia: Secondary | ICD-10-CM | POA: Diagnosis not present

## 2021-08-28 DIAGNOSIS — M7551 Bursitis of right shoulder: Secondary | ICD-10-CM | POA: Diagnosis not present

## 2021-08-28 DIAGNOSIS — K589 Irritable bowel syndrome without diarrhea: Secondary | ICD-10-CM | POA: Diagnosis not present

## 2021-08-28 NOTE — Telephone Encounter (Signed)
Pt is returning your call

## 2021-08-28 NOTE — Telephone Encounter (Signed)
Left a message requesting a return call to the office regarding pt assistance forms.

## 2021-08-29 NOTE — Telephone Encounter (Signed)
Spoke with pt, she stated she printed up the paperwork she needs for pt assistance and will bring them with her to her appointment which is next week.

## 2021-09-03 ENCOUNTER — Ambulatory Visit: Payer: Medicare Other | Admitting: Nurse Practitioner

## 2021-09-03 ENCOUNTER — Encounter: Payer: Self-pay | Admitting: Nurse Practitioner

## 2021-09-03 VITALS — BP 109/68 | HR 80 | Ht 61.0 in | Wt 207.2 lb

## 2021-09-03 DIAGNOSIS — E1165 Type 2 diabetes mellitus with hyperglycemia: Secondary | ICD-10-CM

## 2021-09-03 DIAGNOSIS — E782 Mixed hyperlipidemia: Secondary | ICD-10-CM | POA: Diagnosis not present

## 2021-09-03 LAB — POCT GLYCOSYLATED HEMOGLOBIN (HGB A1C): HbA1c, POC (controlled diabetic range): 7.5 % — AB (ref 0.0–7.0)

## 2021-09-03 MED ORDER — TRESIBA FLEXTOUCH 100 UNIT/ML ~~LOC~~ SOPN: 55.0000 [IU] | PEN_INJECTOR | Freq: Every day | SUBCUTANEOUS | 3 refills | Status: DC

## 2021-09-03 NOTE — Patient Instructions (Signed)

## 2021-09-03 NOTE — Progress Notes (Signed)
09/03/2021, 1:45 PM  Endocrinology follow-up note   Subjective:    Patient ID: Erica Price, female    DOB: 24-Mar-1961.  Erica Price is being seen  in follow-up for management of currently uncontrolled symptomatic diabetes requested by  [name], MD.   Past Medical History:  Diagnosis Date   Apnea    Poss OSA   Arthralgia    Arthritis    Depression    Diabetes mellitus without complication (HCC)    Fatigue    Fibromyalgia    GERD (gastroesophageal reflux disease)    Hypertension    IBS (irritable bowel syndrome)    Myalgia    Pinched nerve    lumbar-l5-s1   PONV (postoperative nausea and vomiting)    Snoring     Past Surgical History:  Procedure Laterality Date   ARTHROSCOPIC REPAIR ACL Left    BALLOON DILATION N/A 06/05/2021   Procedure: BALLOON DILATION;  Surgeon: Eloise Harman, DO;  [facility];  Service: Endoscopy;  Laterality: N/A;   BIOPSY N/A 06/12/2014   Procedure: BIOPSY;  Surgeon: Danie Binder, MD;  Location: [facility];  Service: Endoscopy;  Laterality: N/A;   BIOPSY  09/14/2017   Procedure: BIOPSY;  Surgeon: Danie Binder, MD;  [facility];  Service: Endoscopy;;  colon   BIOPSY  06/05/2021   Procedure: BIOPSY;  Surgeon: Eloise Harman, DO;  [facility];  Service: Endoscopy;;   CARPAL TUNNEL RELEASE Right    CHOLECYSTECTOMY  1990s   CLUB FOOT RELEASE Left    x3   COLONOSCOPY WITH PROPOFOL N/A 06/12/2014   Dr. Suzette Battiest colon polyps removed/moderate divertiiculosis/small internal hemorrhoids/moderate sized external hemorrhoids. tubular adenomas. Next surveillance in Oct 2018.    COLONOSCOPY WITH PROPOFOL N/A 09/14/2017   Dr. Oneida Alar: simple adenomas, moderate diverticulosis in rectosigmoid colon, sigmoid colon, and descending colon. External and internal hemorrhoids, normal colon biopsies. simple adenomas. Needs surveillance  in 3 years with Propofol, colowrap   COLONOSCOPY WITH PROPOFOL N/A 06/05/2021   Procedure: COLONOSCOPY WITH PROPOFOL;  Surgeon: Eloise Harman, DO;  [facility];  Service: Endoscopy;  Laterality: N/A;  7:30am   ESOPHAGOGASTRODUODENOSCOPY (EGD) WITH PROPOFOL N/A 06/12/2014   Dr. 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 benign gastric and duodenal biopsies   ESOPHAGOGASTRODUODENOSCOPY (EGD) WITH PROPOFOL N/A 06/05/2021   Procedure: ESOPHAGOGASTRODUODENOSCOPY (EGD) WITH PROPOFOL;  Surgeon: Eloise Harman, DO;  [facility];  Service: Endoscopy;  Laterality: N/A;   POLYPECTOMY N/A 06/12/2014   Procedure: POLYPECTOMY;  Surgeon: Danie Binder, MD;  Location: [facility];  Service: Endoscopy;  Laterality: N/A;   POLYPECTOMY  09/14/2017   Procedure: POLYPECTOMY;  Surgeon: Danie Binder, MD;  [facility];  Service: Endoscopy;;  colon   POLYPECTOMY  06/05/2021   Procedure: POLYPECTOMY;  Surgeon: Eloise Harman, DO;  [facility];  Service: Endoscopy;;   SAVORY DILATION N/A 06/12/2014   Procedure: SAVORY DILATION;  Surgeon: Carlyon Prows  Rexene Edison, MD;  Location: [facility];  Service: Endoscopy;  Laterality: N/A;  12.8-17    Social History   Socioeconomic History   Marital status: Married    Spouse name: Marcello Moores   Number of children: 2   Years of education: Not on file   Highest education level: Associate degree: occupational, Hotel manager, or vocational program  Occupational History   Occupation: disabled  Tobacco Use   Smoking status: Former    Packs/day: 0.50    Years: 20.00    Pack years: 10.00    Types: Cigarettes    Quit date: 10/18/2011    Years since quitting: 9.8   Smokeless tobacco: Never   Tobacco comments:    quit in 2014  Vaping Use   Vaping Use: Never used  Substance and Sexual Activity   Alcohol use: No   Drug use: No   Sexual activity: Not Currently    Birth control/protection: Post-menopausal   Other Topics Concern   Not on file  Social History Narrative   She is currently unemployed.  She was terminated on 06/15/2013.  She was previously a Loss adjuster, chartered, worked at [employer], and worked in Charity fundraiser for 25 years.   She lives with husband.  They have two children.   Social Determinants of Health   Financial Resource Strain: Not on file  Food Insecurity: Not on file  Transportation Needs: Not on file  Physical Activity: Not on file  Stress: Not on file  Social Connections: Not on file    Family History  Problem Relation Age of Onset   Heart failure Father        Died, 44   Hypertension Mother        Living, 56   Breast cancer Mother    Kidney cancer Mother    Hypercholesterolemia Son    Breast cancer Sister    Pancreatic cancer Brother        passed age 37   Hypertension Sister    Colon cancer Neg Hx     Outpatient Encounter Medications as of 09/03/2021  Medication Sig   albuterol (PROVENTIL HFA;VENTOLIN HFA) 108 (90 Base) MCG/ACT inhaler Inhale 2 puffs into the lungs every 6 (six) hours as needed for wheezing or shortness of breath.   Artificial Tear Solution (SOOTHE XP) SOLN Apply 1 drop to eye daily as needed (dry eyes).   Blood Glucose Monitoring Suppl (ONETOUCH VERIO FLEX SYSTEM) w/Device KIT to check glucose daily   celecoxib (CELEBREX) 200 MG capsule Take 200 mg by mouth 2 (two) times daily.   Cholecalciferol (VITAMIN D3) 5000 units CAPS Take 5,000 Units by mouth daily.   dicyclomine (BENTYL) 10 MG capsule Take 1 capsule (10 mg total) by mouth 4 (four) times daily as needed.   Dulaglutide (TRULICITY) 1.5 RF/1.6BW SOPN Inject 1.5 mg into the skin once a week.   DULoxetine (CYMBALTA) 60 MG capsule Take 60 mg daily by mouth.    fluticasone (FLONASE) 50 MCG/ACT nasal spray Place 2 sprays into both nostrils daily as needed for allergies or rhinitis.   furosemide (LASIX) 20 MG tablet Take 20 mg by mouth daily as needed for fluid.   gabapentin (NEURONTIN) 300 MG  capsule Take 600 mg by mouth 3 (three) times daily.   Lancets (ONETOUCH DELICA PLUS [id number]) MISC Apply topically.   losartan (COZAAR) 25 MG tablet TAKE ONE TABLET BY MOUTH ONCE DAILY   Multiple Vitamin (MULTIVITAMIN WITH MINERALS) TABS tablet Take 1 tablet by mouth daily.   NYAMYC powder  Apply 1 application topically daily as needed (yeast).   ONETOUCH ULTRA test strip 1 each 3 (three) times daily.   pantoprazole (PROTONIX) 40 MG tablet Take 1 tablet (40 mg total) by mouth 2 (two) times daily before a meal. TAKE ONE TABLET BY MOUTH DAILY 30 MINUTES PRIOR TO BREAKFAST.   rOPINIRole (REQUIP) 0.5 MG tablet Take 1 mg by mouth at bedtime.   rosuvastatin (CRESTOR) 10 MG tablet Take 1 tablet (10 mg total) by mouth daily.   SURE COMFORT PEN NEEDLES 31G X 8 MM MISC USE DAILY WITH TRESIBA.   traMADol (ULTRAM) 50 MG tablet Take 100 mg by mouth every 6 (six) hours as needed for moderate pain.   zolpidem (AMBIEN) 10 MG tablet Take 10 mg by mouth at bedtime.   [DISCONTINUED] insulin degludec (TRESIBA FLEXTOUCH) 100 UNIT/ML FlexTouch Pen Inject 55 Units into the skin at bedtime.   insulin degludec (TRESIBA FLEXTOUCH) 100 UNIT/ML FlexTouch Pen Inject 55 Units into the skin at bedtime.   No facility-administered encounter medications on file as of 09/03/2021.    ALLERGIES: No Known Allergies  VACCINATION STATUS:  There is no immunization history on file for this patient.  Diabetes She presents for her follow-up diabetic visit. She has type 2 diabetes mellitus. Onset time: She was diagnosed at approximate age of 55 years. Her disease course has been stable. There are no hypoglycemic associated symptoms. Pertinent negatives for hypoglycemia include no confusion, headaches, pallor or seizures. Associated symptoms include fatigue. Pertinent negatives for diabetes include no chest pain, no polydipsia, no polyphagia and no polyuria. There are no hypoglycemic complications. Symptoms are stable. There are no  diabetic complications. Risk factors for coronary artery disease include diabetes mellitus, obesity, sedentary lifestyle, post-menopausal, tobacco exposure, dyslipidemia and hypertension. Current diabetic treatment includes insulin injections (and Trulicity). She is compliant with treatment most of the time. Her weight is fluctuating minimally. She is following a generally unhealthy diet. When asked about meal planning, she reported none. She has not had a previous visit with a dietitian. She rarely participates in exercise. Her home blood glucose trend is fluctuating minimally. Her overall blood glucose range is 140-180 mg/dl. (She presents today with her meter and logs showing slightly above target glycemic profile overall.  Her POCT A1c today is 7.5%, essentially unchanged from previous visit.  She admits the holidays and renovating her house has had her eating things she knows she shouldn't but they are nearly finished with it and she has plans to get back on track.  She has also had steroid injection in her knee since last visit.) An ACE inhibitor/angiotensin II receptor blocker is not being taken. She does not see a podiatrist.Eye exam is current.  Hyperlipidemia This is a chronic problem. The current episode started more than 1 year ago. The problem is controlled. Recent lipid tests were reviewed and are normal. Exacerbating diseases include chronic renal disease, diabetes and obesity. Factors aggravating her hyperlipidemia include fatty foods. Pertinent negatives include no chest pain. Current antihyperlipidemic treatment includes statins. The current treatment provides significant improvement of lipids. Compliance problems include adherence to exercise and adherence to diet.  Risk factors for coronary artery disease include diabetes mellitus, dyslipidemia, obesity, a sedentary lifestyle, post-menopausal and hypertension.  Hypertension This is a chronic problem. The current episode started more than 1  year ago. The problem has been gradually improving since onset. The problem is controlled. Pertinent negatives include no chest pain or headaches. There are no associated agents to hypertension. Risk factors  for coronary artery disease include diabetes mellitus, dyslipidemia, obesity, post-menopausal state and sedentary lifestyle. Past treatments include angiotensin blockers and diuretics. The current treatment provides moderate improvement. There are no compliance problems.  Hypertensive end-organ damage includes kidney disease. Identifiable causes of hypertension include chronic renal disease and sleep apnea.    Review of systems  Constitutional: + Minimally fluctuating body weight,  current Body mass index is 39.15 kg/m. , no fatigue, no subjective hyperthermia, no subjective hypothermia Eyes: no blurry vision, no xerophthalmia ENT: no sore throat, no nodules palpated in throat, no dysphagia/odynophagia, no hoarseness Cardiovascular: no chest pain, no shortness of breath, no palpitations, no leg swelling Respiratory: no cough, no shortness of breath Gastrointestinal: no nausea/vomiting/diarrhea Musculoskeletal: no muscle/joint aches Skin: no rashes, no hyperemia Neurological: no tremors, no numbness, no tingling, no dizziness Psychiatric: no depression, no anxiety    Objective:    BP 109/68    Pulse 80    Ht 5' 1" (1.549 m)    Wt 207 lb 3.2 oz (94 kg)    SpO2 97%    BMI 39.15 kg/m   Wt Readings from Last 3 Encounters:  09/03/21 207 lb 3.2 oz (94 kg)  06/03/21 206 lb 6.4 oz (93.6 kg)  06/02/21 206 lb 6.4 oz (93.6 kg)    BP Readings from Last 3 Encounters:  09/03/21 109/68  06/05/21 104/70  06/03/21 113/63     Physical Exam- Limited  Constitutional:  Body mass index is 39.15 kg/m. , not in acute distress, normal state of mind Eyes:  EOMI, no exophthalmos Neck: Supple Cardiovascular: RRR, no murmurs, rubs, or gallops, no edema Respiratory: Adequate breathing efforts, no  crackles, rales, rhonchi, or wheezing Musculoskeletal: no gross deformities, strength intact in all four extremities, no gross restriction of joint movements Skin:  no rashes, no hyperemia Neurological: no tremor with outstretched hands    CMP ( most recent) CMP     Component Value Date/Time   NA 135 05/27/2021 1345   K 4.6 05/27/2021 1345   CL 98 05/27/2021 1345   CO2 22 05/27/2021 1345   GLUCOSE 144 (H) 05/27/2021 1345   GLUCOSE 185 (H) 07/27/2017 0916   BUN 15 05/27/2021 1345   CREATININE 0.89 05/27/2021 1345   CALCIUM 9.2 05/27/2021 1345   PROT 7.2 05/27/2021 1345   ALBUMIN 4.3 05/27/2021 1345   AST 18 05/27/2021 1345   ALT 9 05/27/2021 1345   ALKPHOS 81 05/27/2021 1345   BILITOT 0.6 05/27/2021 1345   GFRNONAA 75 09/25/2020 0000   GFRAA 87 09/25/2020 0000    Lipid Panel     Component Value Date/Time   CHOL 110 09/25/2020 0000   CHOL 104 05/16/2020 1002   TRIG 145 09/25/2020 0000   HDL 47 09/25/2020 0000   HDL 47 05/16/2020 1002   LDLCALC 38 09/25/2020 0000   LDLCALC 37 05/16/2020 1002   LABVLDL 20 05/16/2020 1002      Assessment & Plan:   1) Uncontrolled type 2 diabetes mellitus with hyperglycemia (HCC)  - Erica Price has currently uncontrolled symptomatic type 2 DM since 61 years of age.  She presents today with her meter and logs showing slightly above target glycemic profile overall.  Her POCT A1c today is 7.5%, essentially unchanged from previous visit.  She admits the holidays and renovating her house has had her eating things she knows she shouldn't but they are nearly finished with it and she has plans to get back on track.  She has also had  steroid injection in her knee since last visit.  -her diabetes is complicated by obesity/sedentary life and she remains at a high risk for more acute and chronic complications which include CAD, CVA, CKD, retinopathy, and neuropathy. These are all discussed in detail with her.  - Nutritional counseling  repeated at each appointment due to patients tendency to fall back in to old habits.  - The patient admits there is a room for improvement in their diet and drink choices. -  Suggestion is made for the patient to avoid simple carbohydrates from their diet including Cakes, Sweet Desserts / Pastries, Ice Cream, Soda (diet and regular), Sweet Tea, Candies, Chips, Cookies, Sweet Pastries, Store Bought Juices, Alcohol in Excess of 1-2 drinks a day, Artificial Sweeteners, Coffee Creamer, and "Sugar-free" Products. This will help patient to have stable blood glucose profile and potentially avoid unintended weight gain.   - I encouraged the patient to switch to unprocessed or minimally processed complex starch and increased protein intake (animal or plant source), fruits, and vegetables.   - Patient is advised to stick to a routine mealtimes to eat 3 meals a day and avoid unnecessary snacks (to snack only to correct hypoglycemia).  - she will be scheduled with Jearld Fenton, RDN, CDE for individualized diabetes education.  - I have approached her with the following individualized plan to manage diabetes and patient agrees:   -Based on her presenting glycemic profile, she will not need prandial insulin for now.    -Given her stable glycemic profile, no changes will be made to her medication regimen today.  She is advised to continue Tresiba 55 units SQ nightly and Trulicity 1.5 mg SQ weekly (cannot tolerate higher doses due to GI problems).  I did fill out her PAP paperwork today for her Trulicity.  -She did not tolerate Metformin in the past, even in the ER form.  -She is encouraged to consistently monitor glucose twice daily, before breakfast and before bed and call the clinic if she has readings less than 70 or greater than 200 for 3 tests in a row.  - Patient specific target  A1c;  LDL, HDL, Triglycerides,  were discussed in detail.  2) Blood Pressure /Hypertension: Her blood pressure is  controlled to target.  She is advised to continue Lasix 40 mg po daily prn for fluid and Losartan 25 mg po daily.  3) Lipids/Hyperlipidemia:   Her most recent lipid panel from 09/25/20 shows controlled LDL of 38.  She is advised to continue Crestor 10 mg po daily before bed.  Side effects and precautions discussed with her. Says her PCP checked lipid panel in August.  Will request copy.  4)  Weight/Diet:  Her Body mass index is 39.15 kg/m.-   clearly complicating her diabetes care.  She is a candidate for modest weight loss.  I discussed with her the fact that loss of 5 - 10% of her  current body weight will have the most impact on her diabetes management.  CDE Consult will be initiated . Exercise, and detailed carbohydrates information provided  -  detailed on discharge instructions.  5) Chronic Care/Health Maintenance: -she on ARB and Statin medication and is encouraged to initiate and continue to follow up with Ophthalmology, Dentist, Podiatrist at least yearly or according to recommendations, and advised to stay away from smoking. I have recommended yearly flu vaccine and pneumonia vaccine at least every 5 years; moderate intensity exercise for up to 150 minutes weekly; and  sleep for at  least 7 hours a day.  - she is advised to maintain close follow up with [name], MD for primary care needs, as well as her other providers for optimal and coordinated care.      I spent 42 minutes in the care of the patient today including review of labs from [place], Lipids, Thyroid Function, Hematology (current and previous including abstractions from other facilities); face-to-face time discussing  her blood glucose readings/logs, discussing hypoglycemia and hyperglycemia episodes and symptoms, medications doses, her options of short and long term treatment based on the latest standards of care / guidelines;  discussion about incorporating lifestyle medicine;  and documenting the encounter.    Please refer  to Patient Instructions for Blood Glucose Monitoring and Insulin/Medications Dosing Guide"  in media tab for additional information. Please  also refer to " Patient Self Inventory" in the Media  tab for reviewed elements of pertinent patient history.  Allena Katz participated in the discussions, expressed understanding, and voiced agreement with the above plans.  All questions were answered to her satisfaction. she is encouraged to contact clinic should she have any questions or concerns prior to her return visit.    Follow up plan: - Return in about 4 months (around 01/01/2022) for Diabetes F/U with A1c in office, Bring meter and logs, No previsit labs.   Rayetta Pigg, [facility] [address] [place], [place] [address] Phone: [phone] Fax: [phone]  09/03/2021, 1:45 PM

## 2021-09-04 ENCOUNTER — Telehealth: Payer: Self-pay

## 2021-09-04 NOTE — Telephone Encounter (Signed)
Confirmation received at 8:19am that fax went through successfully.

## 2021-09-04 NOTE — Telephone Encounter (Signed)
[place] patient assistance paperwork to [phone].

## 2021-09-22 ENCOUNTER — Telehealth: Payer: Self-pay

## 2021-09-22 NOTE — Telephone Encounter (Signed)
Received a letter from the patients insurance today that her generic Tyler Aas is not covered by her insurance.  This patient is on the patient assistance plan.  Also the list of covered insulin includes with tier: Tresiba (3) Lantus (3) Toujeo (3) Levemir (3)

## 2021-09-30 ENCOUNTER — Telehealth: Payer: Self-pay | Admitting: Nurse Practitioner

## 2021-09-30 NOTE — Telephone Encounter (Signed)
I haven't seen any other paperwork coming through the fax or mailed to [name].

## 2021-09-30 NOTE — Telephone Encounter (Signed)
Pt would like a call back to discuss her [facility] care application form [phone]

## 2021-09-30 NOTE — Telephone Encounter (Signed)
Called patient and she stated that [employer] faxed [name] back some paperwork that was not signed. I have not seen this paperwork and when I pulled her paperwork from file no additional paperwork was attached from after 09/04/2021. I have re-faxed this to [facility] and patient is aware.  Have you seen any other paperwork for this patient from [employer]?

## 2021-10-21 DIAGNOSIS — E1165 Type 2 diabetes mellitus with hyperglycemia: Secondary | ICD-10-CM | POA: Diagnosis not present

## 2021-10-21 DIAGNOSIS — E782 Mixed hyperlipidemia: Secondary | ICD-10-CM | POA: Diagnosis not present

## 2021-10-21 DIAGNOSIS — M1990 Unspecified osteoarthritis, unspecified site: Secondary | ICD-10-CM | POA: Diagnosis not present

## 2021-10-21 DIAGNOSIS — I10 Essential (primary) hypertension: Secondary | ICD-10-CM | POA: Diagnosis not present

## 2021-10-27 DIAGNOSIS — G894 Chronic pain syndrome: Secondary | ICD-10-CM | POA: Diagnosis not present

## 2021-10-27 DIAGNOSIS — E1165 Type 2 diabetes mellitus with hyperglycemia: Secondary | ICD-10-CM | POA: Diagnosis not present

## 2021-10-27 DIAGNOSIS — E559 Vitamin D deficiency, unspecified: Secondary | ICD-10-CM | POA: Diagnosis not present

## 2021-10-27 DIAGNOSIS — E782 Mixed hyperlipidemia: Secondary | ICD-10-CM | POA: Diagnosis not present

## 2021-10-27 DIAGNOSIS — M1990 Unspecified osteoarthritis, unspecified site: Secondary | ICD-10-CM | POA: Diagnosis not present

## 2021-10-27 DIAGNOSIS — E7849 Other hyperlipidemia: Secondary | ICD-10-CM | POA: Diagnosis not present

## 2021-10-27 DIAGNOSIS — E538 Deficiency of other specified B group vitamins: Secondary | ICD-10-CM | POA: Diagnosis not present

## 2021-10-27 DIAGNOSIS — I10 Essential (primary) hypertension: Secondary | ICD-10-CM | POA: Diagnosis not present

## 2021-10-27 DIAGNOSIS — K589 Irritable bowel syndrome without diarrhea: Secondary | ICD-10-CM | POA: Diagnosis not present

## 2021-10-27 DIAGNOSIS — Z0001 Encounter for general adult medical examination with abnormal findings: Secondary | ICD-10-CM | POA: Diagnosis not present

## 2021-10-27 LAB — BASIC METABOLIC PANEL: Glucose: 208

## 2021-10-27 LAB — TSH: TSH: 2.77 (ref 0.41–5.90)

## 2021-10-27 LAB — VITAMIN D 25 HYDROXY (VIT D DEFICIENCY, FRACTURES): Vit D, 25-Hydroxy: 66

## 2021-10-28 LAB — LIPID PANEL
LDL Cholesterol: 17
Triglycerides: 101 (ref 40–160)

## 2021-11-10 DIAGNOSIS — G4730 Sleep apnea, unspecified: Secondary | ICD-10-CM | POA: Diagnosis not present

## 2021-11-21 DIAGNOSIS — E782 Mixed hyperlipidemia: Secondary | ICD-10-CM | POA: Diagnosis not present

## 2021-11-21 DIAGNOSIS — E1165 Type 2 diabetes mellitus with hyperglycemia: Secondary | ICD-10-CM | POA: Diagnosis not present

## 2021-11-21 DIAGNOSIS — M1990 Unspecified osteoarthritis, unspecified site: Secondary | ICD-10-CM | POA: Diagnosis not present

## 2021-11-21 DIAGNOSIS — I10 Essential (primary) hypertension: Secondary | ICD-10-CM | POA: Diagnosis not present

## 2021-12-01 DIAGNOSIS — G4733 Obstructive sleep apnea (adult) (pediatric): Secondary | ICD-10-CM | POA: Diagnosis not present

## 2021-12-03 DIAGNOSIS — G894 Chronic pain syndrome: Secondary | ICD-10-CM | POA: Diagnosis not present

## 2021-12-03 DIAGNOSIS — M797 Fibromyalgia: Secondary | ICD-10-CM | POA: Diagnosis not present

## 2021-12-03 DIAGNOSIS — E538 Deficiency of other specified B group vitamins: Secondary | ICD-10-CM | POA: Diagnosis not present

## 2021-12-03 DIAGNOSIS — M25561 Pain in right knee: Secondary | ICD-10-CM | POA: Diagnosis not present

## 2021-12-09 DIAGNOSIS — G4733 Obstructive sleep apnea (adult) (pediatric): Secondary | ICD-10-CM | POA: Diagnosis not present

## 2021-12-18 DIAGNOSIS — G4733 Obstructive sleep apnea (adult) (pediatric): Secondary | ICD-10-CM | POA: Diagnosis not present

## 2021-12-24 DIAGNOSIS — G4700 Insomnia, unspecified: Secondary | ICD-10-CM | POA: Diagnosis not present

## 2021-12-24 DIAGNOSIS — G894 Chronic pain syndrome: Secondary | ICD-10-CM | POA: Diagnosis not present

## 2021-12-24 DIAGNOSIS — G4733 Obstructive sleep apnea (adult) (pediatric): Secondary | ICD-10-CM | POA: Diagnosis not present

## 2022-01-01 ENCOUNTER — Encounter: Payer: Self-pay | Admitting: Nurse Practitioner

## 2022-01-01 ENCOUNTER — Ambulatory Visit: Payer: Medicare Other | Admitting: Nurse Practitioner

## 2022-01-01 VITALS — BP 126/74 | HR 90 | Ht 61.0 in | Wt 211.0 lb

## 2022-01-01 DIAGNOSIS — G4733 Obstructive sleep apnea (adult) (pediatric): Secondary | ICD-10-CM | POA: Diagnosis not present

## 2022-01-01 DIAGNOSIS — E782 Mixed hyperlipidemia: Secondary | ICD-10-CM | POA: Diagnosis not present

## 2022-01-01 DIAGNOSIS — E1165 Type 2 diabetes mellitus with hyperglycemia: Secondary | ICD-10-CM

## 2022-01-01 LAB — POCT GLYCOSYLATED HEMOGLOBIN (HGB A1C): HbA1c POC (<> result, manual entry): 8.3 % (ref 4.0–5.6)

## 2022-01-01 NOTE — Progress Notes (Signed)
? ?                                                        ?     01/01/2022, 2:07 PM ? ?Endocrinology follow-up note ? ? ?Subjective:  ? ? Patient ID: Erica Price, female    DOB: [birth year].  ?Erica Price is being seen  in follow-up for management of currently uncontrolled symptomatic diabetes requested by  [name], MD. ? ? ?Past Medical History:  ?Diagnosis Date  ? Apnea   ? Poss OSA  ? Arthralgia   ? Arthritis   ? Depression   ? Diabetes mellitus without complication ([place])   ? Fatigue   ? Fibromyalgia   ? GERD (gastroesophageal reflux disease)   ? Hypertension   ? IBS (irritable bowel syndrome)   ? Myalgia   ? Pinched nerve   ? [id number]  ? PONV (postoperative nausea and vomiting)   ? Snoring   ? ? ?Past Surgical History:  ?Procedure Laterality Date  ? ARTHROSCOPIC REPAIR ACL Left   ? BALLOON DILATION N/A 06/05/2021  ? Procedure: BALLOON DILATION;  Surgeon: Eloise Harman, DO;  [facility]: [facility];  Service: Endoscopy;  Laterality: N/A;  ? BIOPSY N/A 06/12/2014  ? Procedure: BIOPSY;  Surgeon: Danie Binder, MD;  Location: [facility];  Service: Endoscopy;  Laterality: N/A;  ? BIOPSY  09/14/2017  ? Procedure: BIOPSY;  Surgeon: Danie Binder, MD;  [facility]: [facility];  Service: Endoscopy;;  colon  ? BIOPSY  06/05/2021  ? Procedure: BIOPSY;  Surgeon: Eloise Harman, DO;  [facility]: [facility];  Service: Endoscopy;;  ? CARPAL TUNNEL RELEASE Right   ? CHOLECYSTECTOMY  1990s  ? CLUB FOOT RELEASE Left   ? x3  ? COLONOSCOPY WITH PROPOFOL N/A 06/12/2014  ? Dr. Suzette Battiest colon polyps removed/moderate divertiiculosis/small internal hemorrhoids/moderate sized external hemorrhoids. tubular adenomas. Next surveillance in Oct 2018.   ? COLONOSCOPY WITH PROPOFOL N/A 09/14/2017  ? Dr. Oneida Alar: simple adenomas, moderate diverticulosis in rectosigmoid colon, sigmoid colon, and descending colon. External and internal hemorrhoids, normal colon biopsies. simple adenomas. Needs surveillance  in 3 years with Propofol, colowrap  ? COLONOSCOPY WITH PROPOFOL N/A 06/05/2021  ? Procedure: COLONOSCOPY WITH PROPOFOL;  Surgeon: Eloise Harman, DO;  [facility]: [facility];  Service: Endoscopy;  Laterality: N/A;  7:30am  ? ESOPHAGOGASTRODUODENOSCOPY (EGD) WITH PROPOFOL N/A 06/12/2014  ? Dr. 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 benign gastric and duodenal biopsies  ? ESOPHAGOGASTRODUODENOSCOPY (EGD) WITH PROPOFOL N/A 06/05/2021  ? Procedure: ESOPHAGOGASTRODUODENOSCOPY (EGD) WITH PROPOFOL;  Surgeon: Eloise Harman, DO;  [facility];  Service: Endoscopy;  Laterality: N/A;  ? POLYPECTOMY N/A 06/12/2014  ? Procedure: POLYPECTOMY;  Surgeon: Danie Binder, MD;  Location: [facility];  Service: Endoscopy;  Laterality: N/A;  ? POLYPECTOMY  09/14/2017  ? Procedure: POLYPECTOMY;  Surgeon: Danie Binder, MD;  [facility];  Service: Endoscopy;;  colon  ? POLYPECTOMY  06/05/2021  ? Procedure: POLYPECTOMY;  Surgeon: Eloise Harman, DO;  [facility];  Service: Endoscopy;;  ? SAVORY DILATION N/A 06/12/2014  ? Procedure: SAVORY DILATION;  Surgeon: Danie Binder, MD;  Location: [facility];  Service: Endoscopy;  Laterality: N/A;  12.8-17  ? ? ?Social  History  ? ?Socioeconomic History  ? Marital status: Married  ?  Spouse name: Marcello Moores  ? Number of children: 2  ? Years of education: Not on file  ? Highest education level: Associate degree: occupational, Hotel manager, or vocational program  ?Occupational History  ? Occupation: disabled  ?Tobacco Use  ? Smoking status: Former  ?  Packs/day: 0.50  ?  Years: 20.00  ?  Pack years: 10.00  ?  Types: Cigarettes  ?  Quit date: 10/18/2011  ?  Years since quitting: 10.2  ? Smokeless tobacco: Never  ? Tobacco comments:  ?  quit in 2014  ?Vaping Use  ? Vaping Use: Never used  ?Substance and Sexual Activity  ? Alcohol use: No  ? Drug use: No  ? Sexual activity: Not Currently  ?  Birth control/protection: Post-menopausal   ?Other Topics Concern  ? Not on file  ?Social History Narrative  ? She is currently unemployed.  She was terminated on 06/15/2013.  She was previously a Loss adjuster, chartered, worked at [employer], and worked in Charity fundraiser for 25 years.  ? She lives with husband.  They have two children.  ? ?Social Determinants of Health  ? ?Financial Resource Strain: Not on file  ?Food Insecurity: Not on file  ?Transportation Needs: Not on file  ?Physical Activity: Not on file  ?Stress: Not on file  ?Social Connections: Not on file  ? ? ?Family History  ?Problem Relation Age of Onset  ? Heart failure Father   ?     Died, 78  ? Hypertension Mother   ?     Living, 81  ? Breast cancer Mother   ? Kidney cancer Mother   ? Hypercholesterolemia Son   ? Breast cancer Sister   ? Pancreatic cancer Brother   ?     passed age 85  ? Hypertension Sister   ? Colon cancer Neg Hx   ? ? ?Outpatient Encounter Medications as of 01/01/2022  ?Medication Sig  ? albuterol (PROVENTIL HFA;VENTOLIN HFA) 108 (90 Base) MCG/ACT inhaler Inhale 2 puffs into the lungs every 6 (six) hours as needed for wheezing or shortness of breath.  ? Artificial Tear Solution (SOOTHE XP) SOLN Apply 1 drop to eye daily as needed (dry eyes).  ? BELSOMRA 10 MG TABS Take 1 tablet by mouth at bedtime.  ? Blood Glucose Monitoring Suppl (ONETOUCH VERIO FLEX SYSTEM) w/Device KIT to check glucose daily  ? celecoxib (CELEBREX) 200 MG capsule Take 200 mg by mouth 2 (two) times daily.  ? Dulaglutide (TRULICITY) 1.5 GE/9.5MW SOPN Inject 1.5 mg into the skin once a week.  ? DULoxetine (CYMBALTA) 60 MG capsule Take 60 mg daily by mouth.   ? fluticasone (FLONASE) 50 MCG/ACT nasal spray Place 2 sprays into both nostrils daily as needed for allergies or rhinitis.  ? furosemide (LASIX) 20 MG tablet Take 20 mg by mouth daily as needed for fluid.  ? gabapentin (NEURONTIN) 300 MG capsule Take 600 mg by mouth 3 (three) times daily.  ? insulin degludec (TRESIBA FLEXTOUCH) 100 UNIT/ML FlexTouch Pen Inject 55  Units into the skin at bedtime.  ? Lancets (ONETOUCH DELICA PLUS [id number]) MISC Apply topically.  ? losartan (COZAAR) 25 MG tablet TAKE ONE TABLET BY MOUTH ONCE DAILY  ? Multiple Vitamin (MULTIVITAMIN WITH MINERALS) TABS tablet Take 1 tablet by mouth daily.  ? NYAMYC powder Apply 1 application topically daily as needed (yeast).  ? ONETOUCH ULTRA test strip 1 each 3 (three) times daily.  ? pantoprazole (  PROTONIX) 40 MG tablet Take 1 tablet (40 mg total) by mouth 2 (two) times daily before a meal. TAKE ONE TABLET BY MOUTH DAILY 30 MINUTES PRIOR TO BREAKFAST.  ? rOPINIRole (REQUIP) 0.5 MG tablet Take 1 mg by mouth at bedtime.  ? rosuvastatin (CRESTOR) 10 MG tablet Take 1 tablet (10 mg total) by mouth daily.  ? SURE COMFORT PEN NEEDLES 31G X 8 MM MISC USE DAILY WITH TRESIBA.  ? traMADol (ULTRAM) 50 MG tablet Take 100 mg by mouth every 6 (six) hours as needed for moderate pain.  ? zolpidem (AMBIEN) 10 MG tablet Take 10 mg by mouth at bedtime.  ? [DISCONTINUED] Cholecalciferol (VITAMIN D3) 5000 units CAPS Take 5,000 Units by mouth daily.  ? [DISCONTINUED] dicyclomine (BENTYL) 10 MG capsule Take 1 capsule (10 mg total) by mouth 4 (four) times daily as needed.  ? ?No facility-administered encounter medications on file as of 01/01/2022.  ? ? ?ALLERGIES: ?No Known Allergies ? ?VACCINATION STATUS: ? ?There is no immunization history on file for this patient. ? ?Diabetes ?She presents for her follow-up diabetic visit. She has type 2 diabetes mellitus. Onset time: She was diagnosed at approximate age of 61 years. Her disease course has been fluctuating. There are no hypoglycemic associated symptoms. Pertinent negatives for hypoglycemia include no confusion, headaches, pallor or seizures. Associated symptoms include fatigue. Pertinent negatives for diabetes include no chest pain, no polydipsia, no polyphagia, no polyuria and no weight loss. There are no hypoglycemic complications. Symptoms are stable. There are no diabetic  complications. Risk factors for coronary artery disease include diabetes mellitus, obesity, sedentary lifestyle, post-menopausal, tobacco exposure, dyslipidemia and hypertension. Current diabetic treatment incl

## 2022-01-01 NOTE — Patient Instructions (Signed)
Diabetes Mellitus and Foot Care Foot care is an important part of your health, especially when you have diabetes. Diabetes may cause you to have problems because of poor blood flow (circulation) to your feet and legs, which can cause your skin to: Become thinner and drier. Break more easily. Heal more slowly. Peel and crack. You may also have nerve damage (neuropathy) in your legs and feet, causing decreased feeling in them. This means that you may not notice minor injuries to your feet that could lead to more serious problems. Noticing and addressing any potential problems early is the best way to prevent future foot problems. How to care for your feet Foot hygiene  Wash your feet daily with warm water and mild soap. Do not use hot water. Then, pat your feet and the areas between your toes until they are completely dry. Do not soak your feet as this can dry your skin. Trim your toenails straight across. Do not dig under them or around the cuticle. File the edges of your nails with an emery board or nail file. Apply a moisturizing lotion or petroleum jelly to the skin on your feet and to dry, brittle toenails. Use lotion that does not contain alcohol and is unscented. Do not apply lotion between your toes. Shoes and socks Wear clean socks or stockings every day. Make sure they are not too tight. Do not wear knee-high stockings since they may decrease blood flow to your legs. Wear shoes that fit properly and have enough cushioning. Always look in your shoes before you put them on to be sure there are no objects inside. To break in new shoes, wear them for just a few hours a day. This prevents injuries on your feet. Wounds, scrapes, corns, and calluses  Check your feet daily for blisters, cuts, bruises, sores, and redness. If you cannot see the bottom of your feet, use a mirror or ask someone for help. Do not cut corns or calluses or try to remove them with medicine. If you find a minor scrape,  cut, or break in the skin on your feet, keep it and the skin around it clean and dry. You may clean these areas with mild soap and water. Do not clean the area with peroxide, alcohol, or iodine. If you have a wound, scrape, corn, or callus on your foot, look at it several times a day to make sure it is healing and not infected. Check for: Redness, swelling, or pain. Fluid or blood. Warmth. Pus or a bad smell. General tips Do not cross your legs. This may decrease blood flow to your feet. Do not use heating pads or hot water bottles on your feet. They may burn your skin. If you have lost feeling in your feet or legs, you may not know this is happening until it is too late. Protect your feet from hot and cold by wearing shoes, such as at the beach or on hot pavement. Schedule a complete foot exam at least once a year (annually) or more often if you have foot problems. Report any cuts, sores, or bruises to your health care provider immediately. Where to find more information American Diabetes Association: www.diabetes.org Association of Diabetes Care & Education Specialists: www.diabeteseducator.org Contact a health care provider if: You have a medical condition that increases your risk of infection and you have any cuts, sores, or bruises on your feet. You have an injury that is not healing. You have redness on your legs or feet. You   feel burning or tingling in your legs or feet. You have pain or cramps in your legs and feet. Your legs or feet are numb. Your feet always feel cold. You have pain around any toenails. Get help right away if: You have a wound, scrape, corn, or callus on your foot and: You have pain, swelling, or redness that gets worse. You have fluid or blood coming from the wound, scrape, corn, or callus. Your wound, scrape, corn, or callus feels warm to the touch. You have pus or a bad smell coming from the wound, scrape, corn, or callus. You have a fever. You have a red  line going up your leg. Summary Check your feet every day for blisters, cuts, bruises, sores, and redness. Apply a moisturizing lotion or petroleum jelly to the skin on your feet and to dry, brittle toenails. Wear shoes that fit properly and have enough cushioning. If you have foot problems, report any cuts, sores, or bruises to your health care provider immediately. Schedule a complete foot exam at least once a year (annually) or more often if you have foot problems. This information is not intended to replace advice given to you by your health care provider. Make sure you discuss any questions you have with your health care provider. Document Revised: 02/29/2020 Document Reviewed: 02/29/2020 [employer] Patient Education  2023 [employer]  

## 2022-01-02 DIAGNOSIS — E1165 Type 2 diabetes mellitus with hyperglycemia: Secondary | ICD-10-CM | POA: Diagnosis not present

## 2022-01-08 DIAGNOSIS — G4733 Obstructive sleep apnea (adult) (pediatric): Secondary | ICD-10-CM | POA: Diagnosis not present

## 2022-01-13 ENCOUNTER — Other Ambulatory Visit: Payer: Self-pay | Admitting: Nurse Practitioner

## 2022-01-13 MED ORDER — FREESTYLE PRECISION NEO TEST VI STRP
ORAL_STRIP | 12 refills | Status: DC
Start: 2022-01-13 — End: 2022-01-29

## 2022-01-15 DIAGNOSIS — X32XXXD Exposure to sunlight, subsequent encounter: Secondary | ICD-10-CM | POA: Diagnosis not present

## 2022-01-15 DIAGNOSIS — L820 Inflamed seborrheic keratosis: Secondary | ICD-10-CM | POA: Diagnosis not present

## 2022-01-15 DIAGNOSIS — L570 Actinic keratosis: Secondary | ICD-10-CM | POA: Diagnosis not present

## 2022-01-15 DIAGNOSIS — D225 Melanocytic nevi of trunk: Secondary | ICD-10-CM | POA: Diagnosis not present

## 2022-01-29 ENCOUNTER — Other Ambulatory Visit: Payer: Self-pay

## 2022-01-29 MED ORDER — GLUCOSE BLOOD VI STRP
1.0000 | ORAL_STRIP | Freq: Four times a day (QID) | 3 refills | Status: DC
Start: 2022-01-29 — End: 2022-09-23

## 2022-01-29 MED ORDER — ONETOUCH VERIO FLEX SYSTEM W/DEVICE KIT: PACK | 0 refills | Status: DC

## 2022-01-29 MED ORDER — ONETOUCH DELICA PLUS LANCET33G MISC: 1.0000 | Freq: Four times a day (QID) | 3 refills | Status: DC

## 2022-02-02 DIAGNOSIS — E1165 Type 2 diabetes mellitus with hyperglycemia: Secondary | ICD-10-CM | POA: Diagnosis not present

## 2022-02-04 DIAGNOSIS — G4733 Obstructive sleep apnea (adult) (pediatric): Secondary | ICD-10-CM | POA: Diagnosis not present

## 2022-02-04 DIAGNOSIS — G894 Chronic pain syndrome: Secondary | ICD-10-CM | POA: Diagnosis not present

## 2022-02-04 DIAGNOSIS — M5416 Radiculopathy, lumbar region: Secondary | ICD-10-CM | POA: Diagnosis not present

## 2022-02-04 DIAGNOSIS — G4700 Insomnia, unspecified: Secondary | ICD-10-CM | POA: Diagnosis not present

## 2022-02-05 DIAGNOSIS — G4733 Obstructive sleep apnea (adult) (pediatric): Secondary | ICD-10-CM | POA: Diagnosis not present

## 2022-02-05 DIAGNOSIS — E538 Deficiency of other specified B group vitamins: Secondary | ICD-10-CM | POA: Diagnosis not present

## 2022-02-05 DIAGNOSIS — G894 Chronic pain syndrome: Secondary | ICD-10-CM | POA: Diagnosis not present

## 2022-02-08 DIAGNOSIS — G4733 Obstructive sleep apnea (adult) (pediatric): Secondary | ICD-10-CM | POA: Diagnosis not present

## 2022-02-20 DIAGNOSIS — I10 Essential (primary) hypertension: Secondary | ICD-10-CM | POA: Diagnosis not present

## 2022-02-20 DIAGNOSIS — E1165 Type 2 diabetes mellitus with hyperglycemia: Secondary | ICD-10-CM | POA: Diagnosis not present

## 2022-02-20 DIAGNOSIS — M1990 Unspecified osteoarthritis, unspecified site: Secondary | ICD-10-CM | POA: Diagnosis not present

## 2022-02-20 DIAGNOSIS — E782 Mixed hyperlipidemia: Secondary | ICD-10-CM | POA: Diagnosis not present

## 2022-03-04 DIAGNOSIS — E1165 Type 2 diabetes mellitus with hyperglycemia: Secondary | ICD-10-CM | POA: Diagnosis not present

## 2022-03-10 DIAGNOSIS — G4733 Obstructive sleep apnea (adult) (pediatric): Secondary | ICD-10-CM | POA: Diagnosis not present

## 2022-03-16 ENCOUNTER — Other Ambulatory Visit (HOSPITAL_COMMUNITY): Payer: Self-pay | Admitting: Family Medicine

## 2022-03-16 DIAGNOSIS — Z1231 Encounter for screening mammogram for malignant neoplasm of breast: Secondary | ICD-10-CM

## 2022-03-18 DIAGNOSIS — G4733 Obstructive sleep apnea (adult) (pediatric): Secondary | ICD-10-CM | POA: Diagnosis not present

## 2022-04-01 DIAGNOSIS — G4700 Insomnia, unspecified: Secondary | ICD-10-CM | POA: Diagnosis not present

## 2022-04-01 DIAGNOSIS — G4733 Obstructive sleep apnea (adult) (pediatric): Secondary | ICD-10-CM | POA: Diagnosis not present

## 2022-04-01 DIAGNOSIS — M5416 Radiculopathy, lumbar region: Secondary | ICD-10-CM | POA: Diagnosis not present

## 2022-04-01 DIAGNOSIS — G894 Chronic pain syndrome: Secondary | ICD-10-CM | POA: Diagnosis not present

## 2022-04-04 DIAGNOSIS — E1165 Type 2 diabetes mellitus with hyperglycemia: Secondary | ICD-10-CM | POA: Diagnosis not present

## 2022-04-08 ENCOUNTER — Ambulatory Visit: Payer: Medicare Other | Admitting: Nurse Practitioner

## 2022-04-08 ENCOUNTER — Encounter: Payer: Self-pay | Admitting: Nurse Practitioner

## 2022-04-08 VITALS — BP 145/84 | HR 81 | Ht 61.0 in | Wt 214.0 lb

## 2022-04-08 DIAGNOSIS — M797 Fibromyalgia: Secondary | ICD-10-CM | POA: Diagnosis not present

## 2022-04-08 DIAGNOSIS — E1165 Type 2 diabetes mellitus with hyperglycemia: Secondary | ICD-10-CM | POA: Diagnosis not present

## 2022-04-08 DIAGNOSIS — E559 Vitamin D deficiency, unspecified: Secondary | ICD-10-CM | POA: Diagnosis not present

## 2022-04-08 DIAGNOSIS — E538 Deficiency of other specified B group vitamins: Secondary | ICD-10-CM | POA: Diagnosis not present

## 2022-04-08 DIAGNOSIS — G4733 Obstructive sleep apnea (adult) (pediatric): Secondary | ICD-10-CM | POA: Diagnosis not present

## 2022-04-08 DIAGNOSIS — G894 Chronic pain syndrome: Secondary | ICD-10-CM | POA: Diagnosis not present

## 2022-04-08 DIAGNOSIS — E782 Mixed hyperlipidemia: Secondary | ICD-10-CM | POA: Diagnosis not present

## 2022-04-08 DIAGNOSIS — I10 Essential (primary) hypertension: Secondary | ICD-10-CM | POA: Diagnosis not present

## 2022-04-08 LAB — POCT GLYCOSYLATED HEMOGLOBIN (HGB A1C): HbA1c POC (<> result, manual entry): 8.1 % (ref 4.0–5.6)

## 2022-04-08 MED ORDER — TRESIBA FLEXTOUCH 100 UNIT/ML ~~LOC~~ SOPN
55.0000 [IU] | PEN_INJECTOR | Freq: Every day | SUBCUTANEOUS | 3 refills | Status: DC
Start: 2022-04-08 — End: 2022-07-09

## 2022-04-08 MED ORDER — TRULICITY 1.5 MG/0.5ML ~~LOC~~ SOAJ
1.5000 mg | SUBCUTANEOUS | 3 refills | Status: DC
Start: 2022-04-08 — End: 2022-07-09

## 2022-04-08 MED ORDER — GLIPIZIDE ER 5 MG PO TB24: 5.0000 mg | ORAL_TABLET | Freq: Every day | ORAL | 3 refills | Status: DC

## 2022-04-08 NOTE — Progress Notes (Signed)
04/08/2022, 1:44 PM  Endocrinology follow-up note   Subjective:    Patient ID: [name], female    DOB: 04/30/1961.  Erica Price is being seen  in follow-up for management of currently uncontrolled symptomatic diabetes requested by  Erica Sites, MD.   Past Medical History:  Diagnosis Date   Apnea    Poss OSA   Arthralgia    Arthritis    Depression    Diabetes mellitus without complication (HCC)    Fatigue    Fibromyalgia    GERD (gastroesophageal reflux disease)    Hypertension    IBS (irritable bowel syndrome)    Myalgia    Pinched nerve    lumbar-l5-s1   PONV (postoperative nausea and vomiting)    Snoring     Past Surgical History:  Procedure Laterality Date   ARTHROSCOPIC REPAIR ACL Left    BALLOON DILATION N/A 06/05/2021   Procedure: BALLOON DILATION;  Surgeon: Eloise Harman, DO;  [facility];  Service: Endoscopy;  Laterality: N/A;   BIOPSY N/A 06/12/2014   Procedure: BIOPSY;  Surgeon: Danie Binder, MD;  Location: [facility];  Service: Endoscopy;  Laterality: N/A;   BIOPSY  09/14/2017   Procedure: BIOPSY;  Surgeon: Danie Binder, MD;  [facility];  Service: Endoscopy;;  colon   BIOPSY  06/05/2021   Procedure: BIOPSY;  Surgeon: Eloise Harman, DO;  [facility];  Service: Endoscopy;;   CARPAL TUNNEL RELEASE Right    CHOLECYSTECTOMY  1990s   CLUB FOOT RELEASE Left    x3   COLONOSCOPY WITH PROPOFOL N/A 06/12/2014   Dr. Suzette Battiest colon polyps removed/moderate divertiiculosis/small internal hemorrhoids/moderate sized external hemorrhoids. tubular adenomas. Next surveillance in Oct 2018.    COLONOSCOPY WITH PROPOFOL N/A 09/14/2017   Dr. Oneida Alar: simple adenomas, moderate diverticulosis in rectosigmoid colon, sigmoid colon, and descending colon. External and internal hemorrhoids, normal colon biopsies. simple adenomas. Needs surveillance  in 3 years with Propofol, colowrap   COLONOSCOPY WITH PROPOFOL N/A 06/05/2021   Procedure: COLONOSCOPY WITH PROPOFOL;  Surgeon: Eloise Harman, DO;  [facility];  Service: Endoscopy;  Laterality: N/A;  7:30am   ESOPHAGOGASTRODUODENOSCOPY (EGD) WITH PROPOFOL N/A 06/12/2014   Dr. 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 benign gastric and duodenal biopsies   ESOPHAGOGASTRODUODENOSCOPY (EGD) WITH PROPOFOL N/A 06/05/2021   Procedure: ESOPHAGOGASTRODUODENOSCOPY (EGD) WITH PROPOFOL;  Surgeon: Eloise Harman, DO;  [facility];  Service: Endoscopy;  Laterality: N/A;   POLYPECTOMY N/A 06/12/2014   Procedure: POLYPECTOMY;  Surgeon: Danie Binder, MD;  Location: [facility];  Service: Endoscopy;  Laterality: N/A;   POLYPECTOMY  09/14/2017   Procedure: POLYPECTOMY;  Surgeon: Danie Binder, MD;  [facility];  Service: Endoscopy;;  colon   POLYPECTOMY  06/05/2021   Procedure: POLYPECTOMY;  Surgeon: Eloise Harman, DO;  [facility];  Service: Endoscopy;;   SAVORY DILATION N/A 06/12/2014   Procedure: SAVORY DILATION;  Surgeon: Danie Binder, MD;  Location: [facility];  Service: Endoscopy;  Laterality: N/A;  12.8-17    Social  History   Socioeconomic History   Marital status: Married    Spouse name: Erica Price   Number of children: 2   Years of education: Not on file   Highest education level: Associate degree: occupational, Hotel manager, or vocational program  Occupational History   Occupation: disabled  Tobacco Use   Smoking status: Former    Packs/day: 0.50    Years: 20.00    Total pack years: 10.00    Types: Cigarettes    Quit date: 10/18/2011    Years since quitting: 10.4   Smokeless tobacco: Never   Tobacco comments:    quit in 2014  Vaping Use   Vaping Use: Never used  Substance and Sexual Activity   Alcohol use: No   Drug use: No   Sexual activity: Not Currently    Birth control/protection:  Post-menopausal  Other Topics Concern   Not on file  Social History Narrative   She is currently unemployed.  She was terminated on 06/15/2013.  She was previously a Loss adjuster, chartered, worked at [employer], and worked in Charity fundraiser for 25 years.   She lives with husband.  They have two children.   Social Determinants of Health   Financial Resource Strain: Low Risk  (11/11/2017)   Overall Financial Resource Strain (CARDIA)    Difficulty of Paying Living Expenses: Not hard at all  Food Insecurity: No Food Insecurity (11/11/2017)   Hunger Vital Sign    Worried About Running Out of Food in the Last Year: Never true    Ran Out of Food in the Last Year: Never true  Transportation Needs: No Transportation Needs (11/11/2017)   PRAPARE - Hydrologist (Medical): No    Lack of Transportation (Non-Medical): No  Physical Activity: Insufficiently Active (11/11/2017)   Exercise Vital Sign    Days of Exercise per Week: 1 day    Minutes of Exercise per Session: 20 min  Stress: No Stress Concern Present (11/11/2017)   [place]    Feeling of Stress : Not at all  Social Connections: Moderately Integrated (11/11/2017)   Social Connection and Isolation Panel [NHANES]    Frequency of Communication with Friends and Family: More than three times a week    Frequency of Social Gatherings with Friends and Family: Once a week    Attends Religious Services: 1 to 4 times per year    Active Member of [employer] or Organizations: No    Attends Archivist Meetings: Never    Marital Status: Married    Family History  Problem Relation Age of Onset   Heart failure Father        Died, 23   Hypertension Mother        Living, 59   Breast cancer Mother    Kidney cancer Mother    Hypercholesterolemia Son    Breast cancer Sister    Pancreatic cancer Brother        passed age 56   Hypertension Sister    Colon cancer Neg Hx      Outpatient Encounter Medications as of 04/08/2022  Medication Sig   feeding supplement, GLUCERNA SHAKE, (GLUCERNA SHAKE) LIQD Take 237 mLs by mouth daily.   glipiZIDE (GLUCOTROL XL) 5 MG 24 hr tablet Take 1 tablet (5 mg total) by mouth daily with breakfast.   temazepam (RESTORIL) 15 MG capsule Take 15 mg by mouth at bedtime as needed for sleep.   albuterol (PROVENTIL HFA;VENTOLIN HFA)  108 (90 Base) MCG/ACT inhaler Inhale 2 puffs into the lungs every 6 (six) hours as needed for wheezing or shortness of breath.   Blood Glucose Monitoring Suppl (ONETOUCH VERIO FLEX SYSTEM) w/Device KIT Check BG qid. E11.65   celecoxib (CELEBREX) 200 MG capsule Take 200 mg by mouth 2 (two) times daily.   Dulaglutide (TRULICITY) 1.5 SW/5.4OE SOPN Inject 1.5 mg into the skin once a week.   DULoxetine (CYMBALTA) 60 MG capsule Take 60 mg daily by mouth.    fluticasone (FLONASE) 50 MCG/ACT nasal spray Place 2 sprays into both nostrils daily as needed for allergies or rhinitis.   furosemide (LASIX) 20 MG tablet Take 20 mg by mouth daily as needed for fluid.   gabapentin (NEURONTIN) 300 MG capsule Take 600 mg by mouth 3 (three) times daily.   glucose blood test strip 1 each by Other route 4 (four) times daily. Use as instructed qid. E11.65   insulin degludec (TRESIBA FLEXTOUCH) 100 UNIT/ML FlexTouch Pen Inject 55 Units into the skin at bedtime.   Lancets (ONETOUCH DELICA PLUS [id number]) MISC 1 each by Other route 4 (four) times daily. Test BG qid. E11.65   losartan (COZAAR) 25 MG tablet TAKE ONE TABLET BY MOUTH ONCE DAILY   modafinil (PROVIGIL) 100 MG tablet Take 100 mg by mouth daily.   pantoprazole (PROTONIX) 40 MG tablet Take 1 tablet (40 mg total) by mouth 2 (two) times daily before a meal. TAKE ONE TABLET BY MOUTH DAILY 30 MINUTES PRIOR TO BREAKFAST.   rOPINIRole (REQUIP) 0.5 MG tablet Take 1 mg by mouth at bedtime.   rosuvastatin (CRESTOR) 10 MG tablet Take 1 tablet (10 mg total) by mouth daily.   SURE COMFORT  PEN NEEDLES 31G X 8 MM MISC USE DAILY WITH TRESIBA.   traMADol (ULTRAM) 50 MG tablet Take 100 mg by mouth every 6 (six) hours as needed for moderate pain.   [DISCONTINUED] Artificial Tear Solution (SOOTHE XP) SOLN Apply 1 drop to eye daily as needed (dry eyes).   [DISCONTINUED] BELSOMRA 10 MG TABS Take 1 tablet by mouth at bedtime.   [DISCONTINUED] Dulaglutide (TRULICITY) 1.5 HW/2.9HB SOPN Inject 1.5 mg into the skin once a week.   [DISCONTINUED] insulin degludec (TRESIBA FLEXTOUCH) 100 UNIT/ML FlexTouch Pen Inject 55 Units into the skin at bedtime.   [DISCONTINUED] Multiple Vitamin (MULTIVITAMIN WITH MINERALS) TABS tablet Take 1 tablet by mouth daily.   [DISCONTINUED] [facility] powder Apply 1 application topically daily as needed (yeast).   [DISCONTINUED] zolpidem (AMBIEN) 10 MG tablet Take 10 mg by mouth at bedtime.   No facility-administered encounter medications on file as of 04/08/2022.    ALLERGIES: No Known Allergies  VACCINATION STATUS:  There is no immunization history on file for this patient.  Diabetes She presents for her follow-up diabetic visit. She has type 2 diabetes mellitus. Onset time: She was diagnosed at approximate age of 29 years. Her disease course has been improving. There are no hypoglycemic associated symptoms. Pertinent negatives for hypoglycemia include no confusion, headaches, pallor or seizures. Associated symptoms include fatigue. Pertinent negatives for diabetes include no chest pain, no polydipsia, no polyphagia, no polyuria and no weight loss. There are no hypoglycemic complications. Symptoms are stable. There are no diabetic complications. Risk factors for coronary artery disease include diabetes mellitus, obesity, sedentary lifestyle, post-menopausal, tobacco exposure, dyslipidemia and hypertension. Current diabetic treatment includes insulin injections (and Trulicity). She is compliant with treatment most of the time. Her weight is increasing steadily. She is  following a generally  unhealthy diet. When asked about meal planning, she reported none. She has not had a previous visit with a dietitian. She rarely participates in exercise. Her home blood glucose trend is fluctuating minimally. (She presents today with her CGM showing slightly above target glycemic profile overall.  Her POCT A1c today is 8.1%, improving slightly from last visit of 8.3%.  Analysis of her CGM shows TIR 28%, TAR 72%,TBR 0% with a GMI of 8.3%.  She notes she has eaten a healthy diet, drinks only water and her sugar continues to fluctuate.) An ACE inhibitor/angiotensin II receptor blocker is not being taken. She does not see a podiatrist.Eye exam is current.  Hyperlipidemia This is a chronic problem. The current episode started more than 1 year ago. The problem is controlled. Recent lipid tests were reviewed and are normal. Exacerbating diseases include chronic renal disease, diabetes and obesity. Factors aggravating her hyperlipidemia include fatty foods. Pertinent negatives include no chest pain. Current antihyperlipidemic treatment includes statins. The current treatment provides significant improvement of lipids. Compliance problems include adherence to exercise and adherence to diet.  Risk factors for coronary artery disease include diabetes mellitus, dyslipidemia, obesity, a sedentary lifestyle, post-menopausal and hypertension.  Hypertension This is a chronic problem. The current episode started more than 1 year ago. The problem has been gradually improving since onset. The problem is controlled. Pertinent negatives include no chest pain or headaches. There are no associated agents to hypertension. Risk factors for coronary artery disease include diabetes mellitus, dyslipidemia, obesity, post-menopausal state and sedentary lifestyle. Past treatments include angiotensin blockers and diuretics. The current treatment provides moderate improvement. There are no compliance problems.   Hypertensive end-organ damage includes kidney disease. Identifiable causes of hypertension include chronic renal disease and sleep apnea.     Review of systems  Constitutional: + steadily increasing body weight,  current Body mass index is 40.43 kg/m. , + fatigue, no subjective hyperthermia, no subjective hypothermia Eyes: no blurry vision, no xerophthalmia ENT: no sore throat, no nodules palpated in throat, no dysphagia/odynophagia, no hoarseness Cardiovascular: no chest pain, no shortness of breath, no palpitations, no leg swelling Respiratory: no cough, no shortness of breath Gastrointestinal: no nausea/vomiting/diarrhea Musculoskeletal: no muscle/joint aches Skin: no rashes, no hyperemia Neurological: no tremors, no numbness, no tingling, + intermittent dizziness since starting new sleep aid Psychiatric: no depression, no anxiety    Objective:    BP (!) 145/84   Pulse 81   Ht 5' 1"  (1.549 m)   Wt 214 lb (97.1 kg)   BMI 40.43 kg/m   Wt Readings from Last 3 Encounters:  04/08/22 214 lb (97.1 kg)  01/01/22 211 lb (95.7 kg)  09/03/21 207 lb 3.2 oz (94 kg)    BP Readings from Last 3 Encounters:  04/08/22 (!) 145/84  01/01/22 126/74  09/03/21 109/68      Physical Exam- Limited  Constitutional:  Body mass index is 40.43 kg/m. , not in acute distress, normal state of mind Eyes:  EOMI, no exophthalmos Neck: Supple Cardiovascular: RRR, no murmurs, rubs, or gallops, no edema Respiratory: Adequate breathing efforts, no crackles, rales, rhonchi, or wheezing Musculoskeletal: no gross deformities, strength intact in all four extremities, no gross restriction of joint movements Skin:  no rashes, no hyperemia Neurological: no tremor with outstretched hands   Diabetic Foot Exam - Simple   Simple Foot Form Diabetic Foot exam was performed with the following findings: Yes 04/08/2022  1:41 PM  Visual Inspection No deformities, no ulcerations, no other skin breakdown  bilaterally: Yes Sensation Testing Intact to touch and monofilament testing bilaterally: Yes Pulse Check Posterior Tibialis and Dorsalis pulse intact bilaterally: Yes Comments Left foot smaller than right    CMP ( most recent) CMP     Component Value Date/Time   NA 135 05/27/2021 1345   K 4.6 05/27/2021 1345   CL 98 05/27/2021 1345   CO2 22 05/27/2021 1345   GLUCOSE 144 (H) 05/27/2021 1345   GLUCOSE 185 (H) 07/27/2017 0916   BUN 15 05/27/2021 1345   CREATININE 0.89 05/27/2021 1345   CALCIUM 9.2 05/27/2021 1345   PROT 7.2 05/27/2021 1345   ALBUMIN 4.3 05/27/2021 1345   AST 18 05/27/2021 1345   ALT 9 05/27/2021 1345   ALKPHOS 81 05/27/2021 1345   BILITOT 0.6 05/27/2021 1345   GFRNONAA 75 09/25/2020 0000   GFRAA 87 09/25/2020 0000    Lipid Panel     Component Value Date/Time   CHOL 110 09/25/2020 0000   CHOL 104 05/16/2020 1002   TRIG 101 10/27/2021 0000   HDL 47 09/25/2020 0000   HDL 47 05/16/2020 1002   LDLCALC 17 10/27/2021 0000   LDLCALC 37 05/16/2020 1002   LABVLDL 20 05/16/2020 1002      Assessment & Plan:   1) Uncontrolled type 2 diabetes mellitus with hyperglycemia ([place])  - Erica Price has currently uncontrolled symptomatic type 2 DM since 61 years of age.  She presents today with her CGM showing slightly above target glycemic profile overall.  Her POCT A1c today is 8.1%, improving slightly from last visit of 8.3%.  Analysis of her CGM shows TIR 28%, TAR 72%,TBR 0% with a GMI of 8.3%.  She notes she has eaten a healthy diet, drinks only water and her sugar continues to fluctuate.  -her diabetes is complicated by obesity/sedentary life and she remains at a high risk for more acute and chronic complications which include CAD, CVA, CKD, retinopathy, and neuropathy. These are all discussed in detail with her.  - Nutritional counseling repeated at each appointment due to patients tendency to fall back in to old habits.  - The patient admits there is a  room for improvement in their diet and drink choices. -  Suggestion is made for the patient to avoid simple carbohydrates from their diet including Cakes, Sweet Desserts / Pastries, Ice Cream, Soda (diet and regular), Sweet Tea, Candies, Chips, Cookies, Sweet Pastries, Store Bought Juices, Alcohol in Excess of 1-2 drinks a day, Artificial Sweeteners, Coffee Creamer, and "Sugar-free" Products. This will help patient to have stable blood glucose profile and potentially avoid unintended weight gain.   - I encouraged the patient to switch to unprocessed or minimally processed complex starch and increased protein intake (animal or plant source), fruits, and vegetables.   - Patient is advised to stick to a routine mealtimes to eat 3 meals a day and avoid unnecessary snacks (to snack only to correct hypoglycemia).  - she will be scheduled with Jearld Fenton, RDN, CDE for individualized diabetes education.  - I have approached her with the following individualized plan to manage diabetes and patient agrees:   -Based on her presenting glycemic profile, she will not need prandial insulin for now.    - She is advised to continue Tresiba 55 units SQ nightly and Trulicity 1.5 mg SQ weekly (cannot tolerate higher doses due to GI problems).  Will start her on Glipizide 5 mg XL daily with breakfast to help with high postprandial readings.  -She did not  tolerate Metformin in the past, even in the ER form.    -She is encouraged to consistently monitor glucose twice daily, before breakfast and before bed and call the clinic if she has readings less than 70 or greater than 200 for 3 tests in a row.  She is benefiting from her CGM.  - Patient specific target  A1c;  LDL, HDL, Triglycerides,  were discussed in detail.  2) Blood Pressure /Hypertension: Her blood pressure is controlled to target.  She is advised to continue Lasix 40 mg po daily prn for fluid and Losartan 25 mg po daily.  3) Lipids/Hyperlipidemia:    Her most recent lipid panel from 10/27/21 shows controlled LDL of 17.  She is advised to continue Crestor 10 mg po daily before bed.  Side effects and precautions discussed with her.   4)  Weight/Diet:  Her Body mass index is 40.43 kg/m.-   clearly complicating her diabetes care.  She is a candidate for modest weight loss.  I discussed with her the fact that loss of 5 - 10% of her  current body weight will have the most impact on her diabetes management.  CDE Consult will be initiated . Exercise, and detailed carbohydrates information provided  -  detailed on discharge instructions.  5) Chronic Care/Health Maintenance: -she on ARB and Statin medication and is encouraged to initiate and continue to follow up with Ophthalmology, Dentist, Podiatrist at least yearly or according to recommendations, and advised to stay away from smoking. I have recommended yearly flu vaccine and pneumonia vaccine at least every 5 years; moderate intensity exercise for up to 150 minutes weekly; and  sleep for at least 7 hours a day.  - she is advised to maintain close follow up with Erica Sites, MD for primary care needs, as well as her other providers for optimal and coordinated care.      I spent 42 minutes in the care of the patient today including review of labs from [place], Lipids, Thyroid Function, Hematology (current and previous including abstractions from other facilities); face-to-face time discussing  her blood glucose readings/logs, discussing hypoglycemia and hyperglycemia episodes and symptoms, medications doses, her options of short and long term treatment based on the latest standards of care / guidelines;  discussion about incorporating lifestyle medicine;  and documenting the encounter. Risk reduction counseling performed per USPSTF guidelines to reduce obesity and cardiovascular risk factors.     Please refer to Patient Instructions for Blood Glucose Monitoring and Insulin/Medications Dosing Guide"  in  media tab for additional information. Please  also refer to " Patient Self Inventory" in the Media  tab for reviewed elements of pertinent patient history.  Allena Katz participated in the discussions, expressed understanding, and voiced agreement with the above plans.  All questions were answered to her satisfaction. she is encouraged to contact clinic should she have any questions or concerns prior to her return visit.    Follow up plan: - Return in about 3 months (around 07/09/2022) for Diabetes F/U with A1c in office, No previsit labs, Bring meter and logs.   Rayetta Pigg, [facility] [address] [place], [place] [address] Phone: [phone] Fax: [phone]  04/08/2022, 1:44 PM

## 2022-04-10 DIAGNOSIS — G4733 Obstructive sleep apnea (adult) (pediatric): Secondary | ICD-10-CM | POA: Diagnosis not present

## 2022-04-17 DIAGNOSIS — M9905 Segmental and somatic dysfunction of pelvic region: Secondary | ICD-10-CM | POA: Diagnosis not present

## 2022-04-17 DIAGNOSIS — M5441 Lumbago with sciatica, right side: Secondary | ICD-10-CM | POA: Diagnosis not present

## 2022-04-17 DIAGNOSIS — M9903 Segmental and somatic dysfunction of lumbar region: Secondary | ICD-10-CM | POA: Diagnosis not present

## 2022-04-17 DIAGNOSIS — M9902 Segmental and somatic dysfunction of thoracic region: Secondary | ICD-10-CM | POA: Diagnosis not present

## 2022-04-20 ENCOUNTER — Ambulatory Visit (HOSPITAL_COMMUNITY)
Admission: RE | Admit: 2022-04-20 | Discharge: 2022-04-20 | Disposition: A | Discharge status: Home / Self Care | Payer: Medicare Other | Source: Ambulatory Visit | Attending: Family Medicine | Admitting: Family Medicine

## 2022-04-20 DIAGNOSIS — M9902 Segmental and somatic dysfunction of thoracic region: Secondary | ICD-10-CM | POA: Diagnosis not present

## 2022-04-20 DIAGNOSIS — Z1231 Encounter for screening mammogram for malignant neoplasm of breast: Secondary | ICD-10-CM | POA: Diagnosis not present

## 2022-04-20 DIAGNOSIS — M5441 Lumbago with sciatica, right side: Secondary | ICD-10-CM | POA: Diagnosis not present

## 2022-04-20 DIAGNOSIS — M9903 Segmental and somatic dysfunction of lumbar region: Secondary | ICD-10-CM | POA: Diagnosis not present

## 2022-04-20 DIAGNOSIS — M9905 Segmental and somatic dysfunction of pelvic region: Secondary | ICD-10-CM | POA: Diagnosis not present

## 2022-04-24 DIAGNOSIS — M9905 Segmental and somatic dysfunction of pelvic region: Secondary | ICD-10-CM | POA: Diagnosis not present

## 2022-04-24 DIAGNOSIS — M9903 Segmental and somatic dysfunction of lumbar region: Secondary | ICD-10-CM | POA: Diagnosis not present

## 2022-04-24 DIAGNOSIS — M5441 Lumbago with sciatica, right side: Secondary | ICD-10-CM | POA: Diagnosis not present

## 2022-04-24 DIAGNOSIS — M9902 Segmental and somatic dysfunction of thoracic region: Secondary | ICD-10-CM | POA: Diagnosis not present

## 2022-04-27 ENCOUNTER — Other Ambulatory Visit: Payer: Self-pay | Admitting: Internal Medicine

## 2022-04-28 DIAGNOSIS — M9902 Segmental and somatic dysfunction of thoracic region: Secondary | ICD-10-CM | POA: Diagnosis not present

## 2022-04-28 DIAGNOSIS — M5441 Lumbago with sciatica, right side: Secondary | ICD-10-CM | POA: Diagnosis not present

## 2022-04-28 DIAGNOSIS — M9903 Segmental and somatic dysfunction of lumbar region: Secondary | ICD-10-CM | POA: Diagnosis not present

## 2022-04-28 DIAGNOSIS — M9905 Segmental and somatic dysfunction of pelvic region: Secondary | ICD-10-CM | POA: Diagnosis not present

## 2022-04-29 DIAGNOSIS — M5441 Lumbago with sciatica, right side: Secondary | ICD-10-CM | POA: Diagnosis not present

## 2022-04-29 DIAGNOSIS — M9905 Segmental and somatic dysfunction of pelvic region: Secondary | ICD-10-CM | POA: Diagnosis not present

## 2022-04-29 DIAGNOSIS — M9902 Segmental and somatic dysfunction of thoracic region: Secondary | ICD-10-CM | POA: Diagnosis not present

## 2022-04-29 DIAGNOSIS — M9903 Segmental and somatic dysfunction of lumbar region: Secondary | ICD-10-CM | POA: Diagnosis not present

## 2022-05-05 DIAGNOSIS — E1165 Type 2 diabetes mellitus with hyperglycemia: Secondary | ICD-10-CM | POA: Diagnosis not present

## 2022-05-06 DIAGNOSIS — M5416 Radiculopathy, lumbar region: Secondary | ICD-10-CM | POA: Diagnosis not present

## 2022-05-06 DIAGNOSIS — G894 Chronic pain syndrome: Secondary | ICD-10-CM | POA: Diagnosis not present

## 2022-05-06 DIAGNOSIS — M9905 Segmental and somatic dysfunction of pelvic region: Secondary | ICD-10-CM | POA: Diagnosis not present

## 2022-05-06 DIAGNOSIS — G4700 Insomnia, unspecified: Secondary | ICD-10-CM | POA: Diagnosis not present

## 2022-05-06 DIAGNOSIS — M9902 Segmental and somatic dysfunction of thoracic region: Secondary | ICD-10-CM | POA: Diagnosis not present

## 2022-05-06 DIAGNOSIS — M9903 Segmental and somatic dysfunction of lumbar region: Secondary | ICD-10-CM | POA: Diagnosis not present

## 2022-05-06 DIAGNOSIS — M5441 Lumbago with sciatica, right side: Secondary | ICD-10-CM | POA: Diagnosis not present

## 2022-05-06 DIAGNOSIS — G4733 Obstructive sleep apnea (adult) (pediatric): Secondary | ICD-10-CM | POA: Diagnosis not present

## 2022-05-08 DIAGNOSIS — D72829 Elevated white blood cell count, unspecified: Secondary | ICD-10-CM | POA: Diagnosis not present

## 2022-05-08 DIAGNOSIS — G894 Chronic pain syndrome: Secondary | ICD-10-CM | POA: Diagnosis not present

## 2022-05-08 DIAGNOSIS — M797 Fibromyalgia: Secondary | ICD-10-CM | POA: Diagnosis not present

## 2022-05-08 DIAGNOSIS — E538 Deficiency of other specified B group vitamins: Secondary | ICD-10-CM | POA: Diagnosis not present

## 2022-05-11 DIAGNOSIS — G4733 Obstructive sleep apnea (adult) (pediatric): Secondary | ICD-10-CM | POA: Diagnosis not present

## 2022-05-13 DIAGNOSIS — M5441 Lumbago with sciatica, right side: Secondary | ICD-10-CM | POA: Diagnosis not present

## 2022-05-13 DIAGNOSIS — M9903 Segmental and somatic dysfunction of lumbar region: Secondary | ICD-10-CM | POA: Diagnosis not present

## 2022-05-13 DIAGNOSIS — M9905 Segmental and somatic dysfunction of pelvic region: Secondary | ICD-10-CM | POA: Diagnosis not present

## 2022-05-13 DIAGNOSIS — M9902 Segmental and somatic dysfunction of thoracic region: Secondary | ICD-10-CM | POA: Diagnosis not present

## 2022-05-27 DIAGNOSIS — M9905 Segmental and somatic dysfunction of pelvic region: Secondary | ICD-10-CM | POA: Diagnosis not present

## 2022-05-27 DIAGNOSIS — M9903 Segmental and somatic dysfunction of lumbar region: Secondary | ICD-10-CM | POA: Diagnosis not present

## 2022-05-27 DIAGNOSIS — M5441 Lumbago with sciatica, right side: Secondary | ICD-10-CM | POA: Diagnosis not present

## 2022-05-27 DIAGNOSIS — M9902 Segmental and somatic dysfunction of thoracic region: Secondary | ICD-10-CM | POA: Diagnosis not present

## 2022-06-02 DIAGNOSIS — G4733 Obstructive sleep apnea (adult) (pediatric): Secondary | ICD-10-CM | POA: Diagnosis not present

## 2022-06-04 DIAGNOSIS — G894 Chronic pain syndrome: Secondary | ICD-10-CM | POA: Diagnosis not present

## 2022-06-04 DIAGNOSIS — E538 Deficiency of other specified B group vitamins: Secondary | ICD-10-CM | POA: Diagnosis not present

## 2022-06-04 DIAGNOSIS — Z23 Encounter for immunization: Secondary | ICD-10-CM | POA: Diagnosis not present

## 2022-06-04 DIAGNOSIS — E1165 Type 2 diabetes mellitus with hyperglycemia: Secondary | ICD-10-CM | POA: Diagnosis not present

## 2022-06-10 DIAGNOSIS — M5441 Lumbago with sciatica, right side: Secondary | ICD-10-CM | POA: Diagnosis not present

## 2022-06-10 DIAGNOSIS — G4733 Obstructive sleep apnea (adult) (pediatric): Secondary | ICD-10-CM | POA: Diagnosis not present

## 2022-06-10 DIAGNOSIS — M9902 Segmental and somatic dysfunction of thoracic region: Secondary | ICD-10-CM | POA: Diagnosis not present

## 2022-06-10 DIAGNOSIS — M9903 Segmental and somatic dysfunction of lumbar region: Secondary | ICD-10-CM | POA: Diagnosis not present

## 2022-06-10 DIAGNOSIS — M9905 Segmental and somatic dysfunction of pelvic region: Secondary | ICD-10-CM | POA: Diagnosis not present

## 2022-06-23 DIAGNOSIS — E782 Mixed hyperlipidemia: Secondary | ICD-10-CM | POA: Diagnosis not present

## 2022-06-23 DIAGNOSIS — M1990 Unspecified osteoarthritis, unspecified site: Secondary | ICD-10-CM | POA: Diagnosis not present

## 2022-06-23 DIAGNOSIS — E1165 Type 2 diabetes mellitus with hyperglycemia: Secondary | ICD-10-CM | POA: Diagnosis not present

## 2022-06-23 DIAGNOSIS — I10 Essential (primary) hypertension: Secondary | ICD-10-CM | POA: Diagnosis not present

## 2022-06-30 DIAGNOSIS — G4733 Obstructive sleep apnea (adult) (pediatric): Secondary | ICD-10-CM | POA: Diagnosis not present

## 2022-07-05 DIAGNOSIS — E1165 Type 2 diabetes mellitus with hyperglycemia: Secondary | ICD-10-CM | POA: Diagnosis not present

## 2022-07-06 DIAGNOSIS — G894 Chronic pain syndrome: Secondary | ICD-10-CM | POA: Diagnosis not present

## 2022-07-06 DIAGNOSIS — E119 Type 2 diabetes mellitus without complications: Secondary | ICD-10-CM | POA: Diagnosis not present

## 2022-07-06 DIAGNOSIS — E538 Deficiency of other specified B group vitamins: Secondary | ICD-10-CM | POA: Diagnosis not present

## 2022-07-06 DIAGNOSIS — M797 Fibromyalgia: Secondary | ICD-10-CM | POA: Diagnosis not present

## 2022-07-06 DIAGNOSIS — E118 Type 2 diabetes mellitus with unspecified complications: Secondary | ICD-10-CM | POA: Diagnosis not present

## 2022-07-08 DIAGNOSIS — M5441 Lumbago with sciatica, right side: Secondary | ICD-10-CM | POA: Diagnosis not present

## 2022-07-08 DIAGNOSIS — M9903 Segmental and somatic dysfunction of lumbar region: Secondary | ICD-10-CM | POA: Diagnosis not present

## 2022-07-08 DIAGNOSIS — M9905 Segmental and somatic dysfunction of pelvic region: Secondary | ICD-10-CM | POA: Diagnosis not present

## 2022-07-08 DIAGNOSIS — M9902 Segmental and somatic dysfunction of thoracic region: Secondary | ICD-10-CM | POA: Diagnosis not present

## 2022-07-09 ENCOUNTER — Encounter: Payer: Self-pay | Admitting: Nurse Practitioner

## 2022-07-09 ENCOUNTER — Ambulatory Visit: Payer: Medicare Other | Admitting: Nurse Practitioner

## 2022-07-09 VITALS — BP 120/78 | HR 72 | Ht 61.0 in | Wt 215.6 lb

## 2022-07-09 DIAGNOSIS — E1165 Type 2 diabetes mellitus with hyperglycemia: Secondary | ICD-10-CM

## 2022-07-09 DIAGNOSIS — E782 Mixed hyperlipidemia: Secondary | ICD-10-CM

## 2022-07-09 LAB — POCT UA - MICROALBUMIN: Creatinine, POC: 200 mg/dL

## 2022-07-09 LAB — POCT GLYCOSYLATED HEMOGLOBIN (HGB A1C): Hemoglobin A1C: 7.3 % — AB (ref 4.0–5.6)

## 2022-07-09 MED ORDER — GLIPIZIDE ER 5 MG PO TB24: 5.0000 mg | ORAL_TABLET | Freq: Every day | ORAL | 3 refills | Status: DC

## 2022-07-09 MED ORDER — TRULICITY 3 MG/0.5ML ~~LOC~~ SOAJ: 3.0000 mg | SUBCUTANEOUS | 3 refills | Status: DC

## 2022-07-09 MED ORDER — TRESIBA FLEXTOUCH 100 UNIT/ML ~~LOC~~ SOPN: 55.0000 [IU] | PEN_INJECTOR | Freq: Every day | SUBCUTANEOUS | 3 refills | Status: DC

## 2022-07-09 NOTE — Progress Notes (Signed)
07/09/2022, 1:46 PM  Endocrinology follow-up note   Subjective:    Patient ID: Erica Price, female    DOB: [birth year].  ADELIS DOCTER is being seen  in follow-up for management of currently uncontrolled symptomatic diabetes requested by  [name], MD.   Past Medical History:  Diagnosis Date   Apnea    Poss OSA   Arthralgia    Arthritis    Depression    Diabetes mellitus without complication (HCC)    Fatigue    Fibromyalgia    GERD (gastroesophageal reflux disease)    Hypertension    IBS (irritable bowel syndrome)    Myalgia    Pinched nerve    lumbar-l5-s1   PONV (postoperative nausea and vomiting)    Snoring     Past Surgical History:  Procedure Laterality Date   ARTHROSCOPIC REPAIR ACL Left    BALLOON DILATION N/A 06/05/2021   Procedure: BALLOON DILATION;  Surgeon: Eloise Harman, DO;  [facility];  Service: Endoscopy;  Laterality: N/A;   BIOPSY N/A 06/12/2014   Procedure: BIOPSY;  Surgeon: Danie Binder, MD;  Location: [facility];  Service: Endoscopy;  Laterality: N/A;   BIOPSY  09/14/2017   Procedure: BIOPSY;  Surgeon: Danie Binder, MD;  [facility];  Service: Endoscopy;;  colon   BIOPSY  06/05/2021   Procedure: BIOPSY;  Surgeon: Eloise Harman, DO;  [facility];  Service: Endoscopy;;   CARPAL TUNNEL RELEASE Right    CHOLECYSTECTOMY  1990s   CLUB FOOT RELEASE Left    x3   COLONOSCOPY WITH PROPOFOL N/A 06/12/2014   Dr. Suzette Battiest colon polyps removed/moderate divertiiculosis/small internal hemorrhoids/moderate sized external hemorrhoids. tubular adenomas. Next surveillance in Oct 2018.    COLONOSCOPY WITH PROPOFOL N/A 09/14/2017   Dr. Oneida Alar: simple adenomas, moderate diverticulosis in rectosigmoid colon, sigmoid colon, and descending colon. External and internal hemorrhoids, normal colon biopsies. simple adenomas. Needs surveillance  in 3 years with Propofol, colowrap   COLONOSCOPY WITH PROPOFOL N/A 06/05/2021   Procedure: COLONOSCOPY WITH PROPOFOL;  Surgeon: Eloise Harman, DO;  [facility];  Service: Endoscopy;  Laterality: N/A;  7:30am   ESOPHAGOGASTRODUODENOSCOPY (EGD) WITH PROPOFOL N/A 06/12/2014   Dr. 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 benign gastric and duodenal biopsies   ESOPHAGOGASTRODUODENOSCOPY (EGD) WITH PROPOFOL N/A 06/05/2021   Procedure: ESOPHAGOGASTRODUODENOSCOPY (EGD) WITH PROPOFOL;  Surgeon: Eloise Harman, DO;  [facility];  Service: Endoscopy;  Laterality: N/A;   POLYPECTOMY N/A 06/12/2014   Procedure: POLYPECTOMY;  Surgeon: Danie Binder, MD;  Location: [facility];  Service: Endoscopy;  Laterality: N/A;   POLYPECTOMY  09/14/2017   Procedure: POLYPECTOMY;  Surgeon: Danie Binder, MD;  [facility];  Service: Endoscopy;;  colon   POLYPECTOMY  06/05/2021   Procedure: POLYPECTOMY;  Surgeon: Eloise Harman, DO;  [facility];  Service: Endoscopy;;   SAVORY DILATION N/A 06/12/2014   Procedure: SAVORY DILATION;  Surgeon: Danie Binder, MD;  Location: [facility];  Service: Endoscopy;  Laterality: N/A;  12.8-17    Social  History   Socioeconomic History   Marital status: Married    Spouse name: Marcello Moores   Number of children: 2   Years of education: Not on file   Highest education level: Associate degree: occupational, Hotel manager, or vocational program  Occupational History   Occupation: disabled  Tobacco Use   Smoking status: Former    Packs/day: 0.50    Years: 20.00    Total pack years: 10.00    Types: Cigarettes    Quit date: 10/18/2011    Years since quitting: 10.7   Smokeless tobacco: Never   Tobacco comments:    quit in 2014  Vaping Use   Vaping Use: Never used  Substance and Sexual Activity   Alcohol use: No   Drug use: No   Sexual activity: Not Currently    Birth control/protection:  Post-menopausal  Other Topics Concern   Not on file  Social History Narrative   She is currently unemployed.  She was terminated on 06/15/2013.  She was previously a Loss adjuster, chartered, worked at [employer], and worked in Charity fundraiser for 25 years.   She lives with husband.  They have two children.   Social Determinants of Health   Financial Resource Strain: Low Risk  (11/11/2017)   Overall Financial Resource Strain (CARDIA)    Difficulty of Paying Living Expenses: Not hard at all  Food Insecurity: No Food Insecurity (11/11/2017)   Hunger Vital Sign    Worried About Running Out of Food in the Last Year: Never true    Ran Out of Food in the Last Year: Never true  Transportation Needs: No Transportation Needs (11/11/2017)   PRAPARE - Hydrologist (Medical): No    Lack of Transportation (Non-Medical): No  Physical Activity: Insufficiently Active (11/11/2017)   Exercise Vital Sign    Days of Exercise per Week: 1 day    Minutes of Exercise per Session: 20 min  Stress: No Stress Concern Present (11/11/2017)   [place]    Feeling of Stress : Not at all  Social Connections: Moderately Integrated (11/11/2017)   Social Connection and Isolation Panel [NHANES]    Frequency of Communication with Friends and Family: More than three times a week    Frequency of Social Gatherings with Friends and Family: Once a week    Attends Religious Services: 1 to 4 times per year    Active Member of [employer] or Organizations: No    Attends Archivist Meetings: Never    Marital Status: Married    Family History  Problem Relation Age of Onset   Heart failure Father        Died, 24   Hypertension Mother        Living, 36   Breast cancer Mother    Kidney cancer Mother    Hypercholesterolemia Son    Breast cancer Sister    Pancreatic cancer Brother        passed age 25   Hypertension Sister    Colon cancer Neg Hx      Outpatient Encounter Medications as of 07/09/2022  Medication Sig   albuterol (PROVENTIL HFA;VENTOLIN HFA) 108 (90 Base) MCG/ACT inhaler Inhale 2 puffs into the lungs every 6 (six) hours as needed for wheezing or shortness of breath.   celecoxib (CELEBREX) 200 MG capsule Take 200 mg by mouth 2 (two) times daily.   Dulaglutide (TRULICITY) 3 ZL/9.3TT SOPN Inject 3 mg as directed once a week.  DULoxetine (CYMBALTA) 60 MG capsule Take 60 mg daily by mouth.    feeding supplement, GLUCERNA SHAKE, (GLUCERNA SHAKE) LIQD Take 237 mLs by mouth daily.   fluticasone (FLONASE) 50 MCG/ACT nasal spray Place 2 sprays into both nostrils daily as needed for allergies or rhinitis.   furosemide (LASIX) 20 MG tablet Take 20 mg by mouth daily as needed for fluid.   gabapentin (NEURONTIN) 300 MG capsule Take 600 mg by mouth 3 (three) times daily.   losartan (COZAAR) 25 MG tablet TAKE ONE TABLET BY MOUTH ONCE DAILY   modafinil (PROVIGIL) 100 MG tablet Take 200 mg by mouth daily.   pantoprazole (PROTONIX) 40 MG tablet TAKE ONE TABLET BY MOUTH twice A DAY BEFORE A meal   rOPINIRole (REQUIP) 0.5 MG tablet Take 1 mg by mouth at bedtime.   rosuvastatin (CRESTOR) 10 MG tablet Take 1 tablet (10 mg total) by mouth daily.   temazepam (RESTORIL) 15 MG capsule Take 15 mg by mouth at bedtime as needed for sleep.   traMADol (ULTRAM) 50 MG tablet Take 100 mg by mouth every 6 (six) hours as needed for moderate pain.   [DISCONTINUED] Dulaglutide (TRULICITY) 1.5 PO/2.4MP SOPN Inject 1.5 mg into the skin once a week.   [DISCONTINUED] glipiZIDE (GLUCOTROL XL) 5 MG 24 hr tablet Take 1 tablet (5 mg total) by mouth daily with breakfast.   [DISCONTINUED] insulin degludec (TRESIBA FLEXTOUCH) 100 UNIT/ML FlexTouch Pen Inject 55 Units into the skin at bedtime.   Blood Glucose Monitoring Suppl (ONETOUCH VERIO FLEX SYSTEM) w/Device KIT Check BG qid. E11.65 (Patient not taking: Reported on 07/09/2022)   glipiZIDE (GLUCOTROL XL) 5 MG 24 hr  tablet Take 1 tablet (5 mg total) by mouth daily with breakfast.   glucose blood test strip 1 each by Other route 4 (four) times daily. Use as instructed qid. E11.65 (Patient not taking: Reported on 07/09/2022)   insulin degludec (TRESIBA FLEXTOUCH) 100 UNIT/ML FlexTouch Pen Inject 55 Units into the skin at bedtime.   Lancets (ONETOUCH DELICA PLUS NTIRWE31V) MISC 1 each by Other route 4 (four) times daily. Test BG qid. E11.65 (Patient not taking: Reported on 07/09/2022)   SURE COMFORT PEN NEEDLES 31G X 8 MM MISC USE DAILY WITH TRESIBA. (Patient not taking: Reported on 07/09/2022)   No facility-administered encounter medications on file as of 07/09/2022.    ALLERGIES: No Known Allergies  VACCINATION STATUS:  There is no immunization history on file for this patient.  Diabetes She presents for her follow-up diabetic visit. She has type 2 diabetes mellitus. Onset time: She was diagnosed at approximate age of 67 years. Her disease course has been improving. There are no hypoglycemic associated symptoms. Pertinent negatives for hypoglycemia include no confusion, headaches, pallor or seizures. Associated symptoms include fatigue. Pertinent negatives for diabetes include no chest pain, no polydipsia, no polyphagia, no polyuria and no weight loss. There are no hypoglycemic complications. Symptoms are stable. There are no diabetic complications. Risk factors for coronary artery disease include diabetes mellitus, obesity, sedentary lifestyle, post-menopausal, tobacco exposure, dyslipidemia and hypertension. Current diabetic treatment includes insulin injections and oral agent (monotherapy) (and Trulicity). She is compliant with treatment most of the time. Her weight is increasing steadily. She is following a generally unhealthy diet. When asked about meal planning, she reported none. She has not had a previous visit with a dietitian. She rarely participates in exercise. Her home blood glucose trend is  decreasing steadily. (She presents today with her CGM showing improved, mostly at goal glycemic  profile.  Her POCT A1c today is 7.3%, improving from last visit of 8.1%.  Analysis of her CGM shows TIR 59%, TAR 41%, TBR 0% with a GMI of 7.5%.  She has had steroid injection for chronic inflammation which causes her glucose to run high for several days.  She asks about trying to increase her Trulicity once again.) An ACE inhibitor/angiotensin II receptor blocker is not being taken. She does not see a podiatrist.Eye exam is current.  Hyperlipidemia This is a chronic problem. The current episode started more than 1 year ago. The problem is controlled. Recent lipid tests were reviewed and are normal. Exacerbating diseases include chronic renal disease, diabetes and obesity. Factors aggravating her hyperlipidemia include fatty foods. Pertinent negatives include no chest pain. Current antihyperlipidemic treatment includes statins. The current treatment provides significant improvement of lipids. Compliance problems include adherence to exercise and adherence to diet.  Risk factors for coronary artery disease include diabetes mellitus, dyslipidemia, obesity, a sedentary lifestyle, post-menopausal and hypertension.  Hypertension This is a chronic problem. The current episode started more than 1 year ago. The problem has been gradually improving since onset. The problem is controlled. Pertinent negatives include no chest pain or headaches. There are no associated agents to hypertension. Risk factors for coronary artery disease include diabetes mellitus, dyslipidemia, obesity, post-menopausal state and sedentary lifestyle. Past treatments include angiotensin blockers and diuretics. The current treatment provides moderate improvement. There are no compliance problems.  Hypertensive end-organ damage includes kidney disease. Identifiable causes of hypertension include chronic renal disease and sleep apnea.     Review of  systems  Constitutional: + steadily increasing body weight,  current Body mass index is 40.74 kg/m. , + fatigue, no subjective hyperthermia, no subjective hypothermia Eyes: no blurry vision, no xerophthalmia ENT: no sore throat, no nodules palpated in throat, no dysphagia/odynophagia, no hoarseness Cardiovascular: no chest pain, no shortness of breath, no palpitations, no leg swelling Respiratory: no cough, no shortness of breath Gastrointestinal: no nausea/vomiting/diarrhea Musculoskeletal: no muscle/joint aches Skin: no rashes, no hyperemia Neurological: no tremors, no numbness, no tingling, + intermittent dizziness since starting new sleep aid Psychiatric: no depression, no anxiety    Objective:    BP 120/78 (BP Location: Left Arm, Patient Position: Sitting, Cuff Size: Large)   Pulse 72   Ht _0  (1.549 m)   Wt 215 lb 9.6 oz (97.8 kg)   BMI 40.74 kg/m   Wt Readings from Last 3 Encounters:  07/09/22 215 lb 9.6 oz (97.8 kg)  04/08/22 214 lb (97.1 kg)  01/01/22 211 lb (95.7 kg)    BP Readings from Last 3 Encounters:  07/09/22 120/78  04/08/22 (!) 145/84  01/01/22 126/74     Physical Exam- Limited  Constitutional:  Body mass index is 40.74 kg/m. , not in acute distress, normal state of mind Eyes:  EOMI, no exophthalmos Neck: Supple Cardiovascular: RRR, no murmurs, rubs, or gallops, no edema Respiratory: Adequate breathing efforts, no crackles, rales, rhonchi, or wheezing Musculoskeletal: no gross deformities, strength intact in all four extremities, no gross restriction of joint movements Skin:  no rashes, no hyperemia Neurological: no tremor with outstretched hands   Diabetic Foot Exam - Simple   No data filed    CMP ( most recent) CMP     Component Value Date/Time   NA 135 05/27/2021 1345   K 4.6 05/27/2021 1345   CL 98 05/27/2021 1345   CO2 22 05/27/2021 1345   GLUCOSE 144 (H) 05/27/2021 1345   GLUCOSE  185 (H) 07/27/2017 0916   BUN 15 05/27/2021 1345    CREATININE 0.89 05/27/2021 1345   CALCIUM 9.2 05/27/2021 1345   PROT 7.2 05/27/2021 1345   ALBUMIN 4.3 05/27/2021 1345   AST 18 05/27/2021 1345   ALT 9 05/27/2021 1345   ALKPHOS 81 05/27/2021 1345   BILITOT 0.6 05/27/2021 1345   GFRNONAA 75 09/25/2020 0000   GFRAA 87 09/25/2020 0000    Lipid Panel     Component Value Date/Time   CHOL 110 09/25/2020 0000   CHOL 104 05/16/2020 1002   TRIG 101 10/27/2021 0000   HDL 47 09/25/2020 0000   HDL 47 05/16/2020 1002   LDLCALC 17 10/27/2021 0000   LDLCALC 37 05/16/2020 1002   LABVLDL 20 05/16/2020 1002      Assessment & Plan:   1) Uncontrolled type 2 diabetes mellitus with hyperglycemia ([place])  - KAELAN EMAMI has currently uncontrolled symptomatic type 2 DM since 61 years of age.  She presents today with her CGM showing improved, mostly at goal glycemic profile.  Her POCT A1c today is 7.3%, improving from last visit of 8.1%.  Analysis of her CGM shows TIR 59%, TAR 41%, TBR 0% with a GMI of 7.5%.  She has had steroid injection for chronic inflammation which causes her glucose to run high for several days.  She asks about trying to increase her Trulicity once again.  -her diabetes is complicated by obesity/sedentary life and she remains at a high risk for more acute and chronic complications which include CAD, CVA, CKD, retinopathy, and neuropathy. These are all discussed in detail with her.  - Nutritional counseling repeated at each appointment due to patients tendency to fall back in to old habits.  - The patient admits there is a room for improvement in their diet and drink choices. -  Suggestion is made for the patient to avoid simple carbohydrates from their diet including Cakes, Sweet Desserts / Pastries, Ice Cream, Soda (diet and regular), Sweet Tea, Candies, Chips, Cookies, Sweet Pastries, Store Bought Juices, Alcohol in Excess of 1-2 drinks a day, Artificial Sweeteners, Coffee Creamer, and "Sugar-free" Products. This will help  patient to have stable blood glucose profile and potentially avoid unintended weight gain.   - I encouraged the patient to switch to unprocessed or minimally processed complex starch and increased protein intake (animal or plant source), fruits, and vegetables.   - Patient is advised to stick to a routine mealtimes to eat 3 meals a day and avoid unnecessary snacks (to snack only to correct hypoglycemia).  - she will be scheduled with Jearld Fenton, RDN, CDE for individualized diabetes education.  - I have approached her with the following individualized plan to manage diabetes and patient agrees:   -Based on her presenting glycemic profile, she will not need prandial insulin for now.    - She is advised to continue Tresiba 55 units SQ nightly and Glipizide 5 mg XL daily with breakfast.  Will increase her Trulicity to 3 mg SQ weekly (may double up on her 1.5 mg injections until she depletes her current supply).  She gets this from [facility].  -She did not tolerate Metformin in the past, even in the ER form.    -She is encouraged to consistently monitor glucose twice daily, before breakfast and before bed and call the clinic if she has readings less than 70 or greater than 200 for 3 tests in a row.  She is benefiting from her CGM.  - Patient  specific target  A1c;  LDL, HDL, Triglycerides,  were discussed in detail.  2) Blood Pressure /Hypertension: Her blood pressure is controlled to target.  She is advised to continue Lasix 40 mg po daily prn for fluid and Losartan 25 mg po daily.  3) Lipids/Hyperlipidemia:   Her most recent lipid panel from 04/08/22 shows controlled LDL of 37.  She is advised to continue Crestor 10 mg po daily before bed.  Side effects and precautions discussed with her.   4)  Weight/Diet:  Her Body mass index is 40.74 kg/m.-   clearly complicating her diabetes care.  She is a candidate for modest weight loss.  I discussed with her the fact that loss of 5 - 10% of her  current  body weight will have the most impact on her diabetes management.  CDE Consult will be initiated . Exercise, and detailed carbohydrates information provided  -  detailed on discharge instructions.  5) Chronic Care/Health Maintenance: -she on ARB and Statin medication and is encouraged to initiate and continue to follow up with Ophthalmology, Dentist, Podiatrist at least yearly or according to recommendations, and advised to stay away from smoking. I have recommended yearly flu vaccine and pneumonia vaccine at least every 5 years; moderate intensity exercise for up to 150 minutes weekly; and  sleep for at least 7 hours a day.  - she is advised to maintain close follow up with [name], MD for primary care needs, as well as her other providers for optimal and coordinated care.     I spent 50 minutes in the care of the patient today including review of labs from [place], Lipids, Thyroid Function, Hematology (current and previous including abstractions from other facilities); face-to-face time discussing  her blood glucose readings/logs, discussing hypoglycemia and hyperglycemia episodes and symptoms, medications doses, her options of short and long term treatment based on the latest standards of care / guidelines;  discussion about incorporating lifestyle medicine;  and documenting the encounter. Risk reduction counseling performed per USPSTF guidelines to reduce obesity and cardiovascular risk factors.     Please refer to Patient Instructions for Blood Glucose Monitoring and Insulin/Medications Dosing Guide"  in media tab for additional information. Please  also refer to " Patient Self Inventory" in the Media  tab for reviewed elements of pertinent patient history.  Allena Katz participated in the discussions, expressed understanding, and voiced agreement with the above plans.  All questions were answered to her satisfaction. she is encouraged to contact clinic should she have any questions or  concerns prior to her return visit.    Follow up plan: - Return in about 3 months (around 10/09/2022) for Diabetes F/U with A1c in office, No previsit labs, Bring meter and logs.   Rayetta Pigg, [facility] [address] [place], [place] [address] Phone: [phone] Fax: [phone]  07/09/2022, 1:46 PM

## 2022-07-11 DIAGNOSIS — G4733 Obstructive sleep apnea (adult) (pediatric): Secondary | ICD-10-CM | POA: Diagnosis not present

## 2022-08-04 DIAGNOSIS — E1165 Type 2 diabetes mellitus with hyperglycemia: Secondary | ICD-10-CM | POA: Diagnosis not present

## 2022-08-05 DIAGNOSIS — E538 Deficiency of other specified B group vitamins: Secondary | ICD-10-CM | POA: Diagnosis not present

## 2022-08-05 DIAGNOSIS — G894 Chronic pain syndrome: Secondary | ICD-10-CM | POA: Diagnosis not present

## 2022-08-05 DIAGNOSIS — J069 Acute upper respiratory infection, unspecified: Secondary | ICD-10-CM | POA: Diagnosis not present

## 2022-08-10 ENCOUNTER — Other Ambulatory Visit: Payer: Self-pay | Admitting: Nurse Practitioner

## 2022-08-10 DIAGNOSIS — G4733 Obstructive sleep apnea (adult) (pediatric): Secondary | ICD-10-CM | POA: Diagnosis not present

## 2022-08-21 DIAGNOSIS — J40 Bronchitis, not specified as acute or chronic: Secondary | ICD-10-CM | POA: Diagnosis not present

## 2022-08-23 DIAGNOSIS — M1990 Unspecified osteoarthritis, unspecified site: Secondary | ICD-10-CM | POA: Diagnosis not present

## 2022-08-23 DIAGNOSIS — E1165 Type 2 diabetes mellitus with hyperglycemia: Secondary | ICD-10-CM | POA: Diagnosis not present

## 2022-08-23 DIAGNOSIS — I10 Essential (primary) hypertension: Secondary | ICD-10-CM | POA: Diagnosis not present

## 2022-08-23 DIAGNOSIS — E782 Mixed hyperlipidemia: Secondary | ICD-10-CM | POA: Diagnosis not present

## 2022-09-02 ENCOUNTER — Other Ambulatory Visit: Payer: Self-pay | Admitting: Gastroenterology

## 2022-09-02 NOTE — Telephone Encounter (Signed)
She will need ov. Limited refills provided.

## 2022-09-04 DIAGNOSIS — E538 Deficiency of other specified B group vitamins: Secondary | ICD-10-CM | POA: Diagnosis not present

## 2022-09-04 DIAGNOSIS — G894 Chronic pain syndrome: Secondary | ICD-10-CM | POA: Diagnosis not present

## 2022-09-04 DIAGNOSIS — M1990 Unspecified osteoarthritis, unspecified site: Secondary | ICD-10-CM | POA: Diagnosis not present

## 2022-09-04 DIAGNOSIS — M797 Fibromyalgia: Secondary | ICD-10-CM | POA: Diagnosis not present

## 2022-09-04 DIAGNOSIS — E1165 Type 2 diabetes mellitus with hyperglycemia: Secondary | ICD-10-CM | POA: Diagnosis not present

## 2022-09-10 DIAGNOSIS — G4733 Obstructive sleep apnea (adult) (pediatric): Secondary | ICD-10-CM | POA: Diagnosis not present

## 2022-09-23 ENCOUNTER — Encounter: Payer: Self-pay | Admitting: Gastroenterology

## 2022-09-23 ENCOUNTER — Ambulatory Visit: Payer: Medicare Other | Admitting: Gastroenterology

## 2022-09-23 VITALS — BP 115/67 | HR 84 | Temp 99.1°F | Ht 62.0 in | Wt 213.4 lb

## 2022-09-23 DIAGNOSIS — K219 Gastro-esophageal reflux disease without esophagitis: Secondary | ICD-10-CM

## 2022-09-23 DIAGNOSIS — R140 Abdominal distension (gaseous): Secondary | ICD-10-CM | POA: Diagnosis not present

## 2022-09-23 DIAGNOSIS — E782 Mixed hyperlipidemia: Secondary | ICD-10-CM | POA: Diagnosis not present

## 2022-09-23 DIAGNOSIS — E1165 Type 2 diabetes mellitus with hyperglycemia: Secondary | ICD-10-CM | POA: Diagnosis not present

## 2022-09-23 DIAGNOSIS — I10 Essential (primary) hypertension: Secondary | ICD-10-CM | POA: Diagnosis not present

## 2022-09-23 DIAGNOSIS — M1990 Unspecified osteoarthritis, unspecified site: Secondary | ICD-10-CM | POA: Diagnosis not present

## 2022-09-23 MED ORDER — PANTOPRAZOLE SODIUM 40 MG PO TBEC: DELAYED_RELEASE_TABLET | ORAL | 3 refills | Status: DC

## 2022-09-23 NOTE — Progress Notes (Signed)
GI Office Note    Referring Provider: [name], MD Primary Care Physician:  [name], MD  Primary Gastroenterologist: Elon Alas. Abbey Chatters, DO   Chief Complaint   Chief Complaint  Patient presents with   Follow-up    Here for refills on pantoprazole and is still having issues with gas and bloating.     History of Present Illness   Erica Price is a 62 y.o. female presenting today for follow up. Last seen in 2022. She has h/o GERD, gas/bloating.  Still with gas/bloating. Thinks it is the Trulicity, has been on it four years. More symptoms with higher dose. Uses Gas-x and beano, seems to help. Takes Trulicity on Tuesdays. Within two days, gas/bloating is flared. BM usually in the mornings, sometimes later in the day. Solid stool, large amount. No straining. No melena, brbpr. No heartburn, dysphagia.Weight is up 6 pounds in the past year.  EGD 05/2021: -small hh -mild Schatzki ring s/p dilation -gastritis (slight chronic inflammation, negative H. pylori) -duodenal erosions (normal bx)  Colonoscopy 05/2021: -Nonbleeding internal hemorrhoids -Diverticulosis sigmoid colon -7 mm polyp removed from descending colon (tubular adenoma) -Normal-appearing ileum -Colonoscopy in 5 years       Medications   Current Outpatient Medications  Medication Sig Dispense Refill   albuterol (PROVENTIL HFA;VENTOLIN HFA) 108 (90 Base) MCG/ACT inhaler Inhale 2 puffs into the lungs every 6 (six) hours as needed for wheezing or shortness of breath.     Alpha-D-Galactosidase (BEANO PO) Take by mouth.     celecoxib (CELEBREX) 200 MG capsule Take 200 mg by mouth 2 (two) times daily.     Dulaglutide (TRULICITY) 3 GG/8.3MO SOPN Inject 3 mg as directed once a week. 6 mL 0   DULoxetine (CYMBALTA) 60 MG capsule Take 60 mg daily by mouth.      feeding supplement, GLUCERNA SHAKE, (GLUCERNA SHAKE) LIQD Take 237 mLs by mouth daily.     fluticasone (FLONASE) 50 MCG/ACT nasal spray Place 2  sprays into both nostrils daily as needed for allergies or rhinitis.     furosemide (LASIX) 20 MG tablet Take 20 mg by mouth daily as needed for fluid.     gabapentin (NEURONTIN) 300 MG capsule Take 600 mg by mouth 3 (three) times daily.     glipiZIDE (GLUCOTROL XL) 5 MG 24 hr tablet Take 1 tablet (5 mg total) by mouth daily with breakfast. 90 tablet 3   insulin degludec (TRESIBA FLEXTOUCH) 100 UNIT/ML FlexTouch Pen Inject 55 Units into the skin at bedtime. 45 mL 3   lidocaine (LIDODERM) 5 % 1 patch daily.     losartan (COZAAR) 25 MG tablet TAKE ONE TABLET BY MOUTH ONCE DAILY 90 tablet 0   modafinil (PROVIGIL) 100 MG tablet Take 200 mg by mouth daily.     Omega-3 Fatty Acids (OMEGA-3 FISH OIL PO) Take by mouth.     pantoprazole (PROTONIX) 40 MG tablet TAKE ONE TABLET BY MOUTH TWICE DAILY BEFORE A meal 180 tablet 0   rOPINIRole (REQUIP) 0.5 MG tablet Take 1 mg by mouth at bedtime.     rosuvastatin (CRESTOR) 10 MG tablet Take 1 tablet (10 mg total) by mouth daily. 90 tablet 3   simethicone (MYLICON) 294 MG chewable tablet Chew 125 mg by mouth every 6 (six) hours as needed for flatulence.     traMADol (ULTRAM) 50 MG tablet Take 100 mg by mouth every 6 (six) hours as needed for moderate pain.     VITAMIN D-VITAMIN K  PO Take by mouth.     zolpidem (AMBIEN) 10 MG tablet Take 10 mg by mouth at bedtime.     No current facility-administered medications for this visit.    Allergies   Allergies as of 09/23/2022   (No Known Allergies)       Review of Systems   General: Negative for anorexia, weight loss, fever, chills, fatigue, weakness. ENT: Negative for hoarseness, difficulty swallowing , nasal congestion. CV: Negative for chest pain, angina, palpitations, dyspnea on exertion, peripheral edema.  Respiratory: Negative for dyspnea at rest, dyspnea on exertion, cough, sputum, wheezing.  GI: See history of present illness. GU:  Negative for dysuria, hematuria, urinary incontinence, urinary  frequency, nocturnal urination.  Endo: Negative for unusual weight change.     Physical Exam   BP 115/67 (BP Location: Left Arm, Patient Position: Sitting, Cuff Size: Large)   Pulse 84   Temp 99.1 F (37.3 C) (Oral)   Ht '5\' 2"'$  (1.575 m)   Wt 213 lb 6.4 oz (96.8 kg)   SpO2 96%   BMI 39.03 kg/m    General: Well-nourished, well-developed in no acute distress.  Eyes: No icterus. Mouth: Oropharyngeal mucosa moist and pink   Abdomen: Bowel sounds are normal, nontender, nondistended, no hepatosplenomegaly or masses,  no abdominal bruits or hernia , no rebound or guarding.  Rectal: not performed Extremities: No lower extremity edema. No clubbing or deformities. Neuro: Alert and oriented x 4   Skin: Warm and dry, no jaundice.   Psych: Alert and cooperative, normal mood and affect.  Labs   Lab Results  Component Value Date   HGBA1C 7.3 (A) 07/09/2022   Lab Results  Component Value Date   TSH 2.77 10/27/2021   Lab Results  Component Value Date   ALT 9 05/27/2021   AST 18 05/27/2021   ALKPHOS 81 05/27/2021   BILITOT 0.6 05/27/2021    CBC normal 04/2022.   Imaging Studies   No results found.  Assessment/Plan:   GERD/bloating: typical reflux symptoms well controlled. She has gas/bloating which is likely exacerbated by Trulicity. Improves with Gas-X. Symptoms present for years. Recommend limiting high fodmap foods. Continue PPI once to twice daily, lowest effective dose. Continue Gas-X up to '500mg'$  per day. Return ov in one year or sooner if needed.   Laureen Ochs. Bobby Rumpf, [place], [facility]

## 2022-09-23 NOTE — Patient Instructions (Signed)
Continue pantoprazole '40mg'$  twice daily before a meal. If you can tolerate once daily and still have adequate control of your symptoms, you should try to limit to once daily.  Maximum dose of simethicone is '500mg'$ /day. So you are at maximum amount when you take two per day. Limit foods in the list below as they increase gas production.  Return office visit in one year or sooner if needed.  Foods to avoid Fruits Fresh, dried, and juiced forms of apple, pear, watermelon, peach, plum, cherries, apricots, blackberries, boysenberries, figs, nectarines, and mango. Avocado. Vegetables Chicory root, artichoke, asparagus, cabbage, snow peas, Brussels sprouts, broccoli, sugar snap peas, mushrooms, celery, and cauliflower. Onions, garlic, leeks, and the white part of scallions. Grains Wheat, including kamut, durum, and semolina. Barley and bulgur. Couscous. Wheat-based cereals. Wheat noodles, bread, crackers, and pastries. Meats and other proteins Fried or fatty meat. Sausage. Cashews and pistachios. Soybeans, baked beans, black beans, chickpeas, kidney beans, fava beans, navy beans, lentils, black-eyed peas, and split peas. Dairy Milk, yogurt, ice cream, and soft cheese. Cream and sour cream. Milk-based sauces. Custard. Buttermilk. Soy milk. Seasoning and other foods Any sugar-free gum or candy. Foods that contain artificial sweeteners such as sorbitol, mannitol, isomalt, or xylitol. Foods that contain honey, high-fructose corn syrup, or agave. Bouillon, vegetable stock, beef stock, and chicken stock. Garlic and onion powder. Condiments made with onion, such as hummus, chutney, pickles, relish, salad dressing, and salsa. Tomato paste. Beverages Chicory-based drinks. Coffee substitutes. Chamomile tea. Fennel tea. Sweet or fortified wines such as port or sherry. Diet soft drinks made with isomalt, mannitol, maltitol, sorbitol, or xylitol. Apple, pear, and mango juice. Juices with high-fructose corn syrup. The  items listed above may not be a complete list of foods and beverages you should avoid. Contact a dietitian for more information.

## 2022-09-28 DIAGNOSIS — G4733 Obstructive sleep apnea (adult) (pediatric): Secondary | ICD-10-CM | POA: Diagnosis not present

## 2022-10-05 DIAGNOSIS — E1159 Type 2 diabetes mellitus with other circulatory complications: Secondary | ICD-10-CM | POA: Diagnosis not present

## 2022-10-05 DIAGNOSIS — K589 Irritable bowel syndrome without diarrhea: Secondary | ICD-10-CM | POA: Diagnosis not present

## 2022-10-05 DIAGNOSIS — E538 Deficiency of other specified B group vitamins: Secondary | ICD-10-CM | POA: Diagnosis not present

## 2022-10-05 DIAGNOSIS — Z0001 Encounter for general adult medical examination with abnormal findings: Secondary | ICD-10-CM | POA: Diagnosis not present

## 2022-10-05 DIAGNOSIS — E559 Vitamin D deficiency, unspecified: Secondary | ICD-10-CM | POA: Diagnosis not present

## 2022-10-05 DIAGNOSIS — D72829 Elevated white blood cell count, unspecified: Secondary | ICD-10-CM | POA: Diagnosis not present

## 2022-10-05 DIAGNOSIS — E782 Mixed hyperlipidemia: Secondary | ICD-10-CM | POA: Diagnosis not present

## 2022-10-05 DIAGNOSIS — E1165 Type 2 diabetes mellitus with hyperglycemia: Secondary | ICD-10-CM | POA: Diagnosis not present

## 2022-10-05 DIAGNOSIS — M797 Fibromyalgia: Secondary | ICD-10-CM | POA: Diagnosis not present

## 2022-10-05 DIAGNOSIS — G4733 Obstructive sleep apnea (adult) (pediatric): Secondary | ICD-10-CM | POA: Diagnosis not present

## 2022-10-12 ENCOUNTER — Ambulatory Visit: Payer: Medicare Other | Admitting: Nurse Practitioner

## 2022-10-12 ENCOUNTER — Encounter: Payer: Self-pay | Admitting: Nurse Practitioner

## 2022-10-12 VITALS — BP 117/58 | HR 84 | Ht 62.0 in | Wt 212.8 lb

## 2022-10-12 DIAGNOSIS — E782 Mixed hyperlipidemia: Secondary | ICD-10-CM | POA: Diagnosis not present

## 2022-10-12 DIAGNOSIS — E1165 Type 2 diabetes mellitus with hyperglycemia: Secondary | ICD-10-CM | POA: Diagnosis not present

## 2022-10-12 LAB — POCT GLYCOSYLATED HEMOGLOBIN (HGB A1C): Hemoglobin A1C: 6.8 % — AB (ref 4.0–5.6)

## 2022-10-12 NOTE — Progress Notes (Signed)
10/12/2022, 3:51 PM  Endocrinology follow-up note   Subjective:    Patient ID: Erica Price, female    DOB: [birth year].  Erica Price is being seen  in follow-up for management of currently uncontrolled symptomatic diabetes requested by  Erica Sites, MD.   Past Medical History:  Diagnosis Date   Apnea    Poss OSA   Arthralgia    Arthritis    Depression    Diabetes mellitus without complication (HCC)    Fatigue    Fibromyalgia    GERD (gastroesophageal reflux disease)    Hypertension    IBS (irritable bowel syndrome)    Myalgia    Pinched nerve    lumbar-l5-s1   PONV (postoperative nausea and vomiting)    Snoring     Past Surgical History:  Procedure Laterality Date   ARTHROSCOPIC REPAIR ACL Left    BALLOON DILATION N/A 06/05/2021   Procedure: BALLOON DILATION;  Surgeon: Eloise Harman, DO;  [facility];  Service: Endoscopy;  Laterality: N/A;   BIOPSY N/A 06/12/2014   Procedure: BIOPSY;  Surgeon: Danie Binder, MD;  Location: [facility];  Service: Endoscopy;  Laterality: N/A;   BIOPSY  09/14/2017   Procedure: BIOPSY;  Surgeon: Danie Binder, MD;  [facility];  Service: Endoscopy;;  colon   BIOPSY  06/05/2021   Procedure: BIOPSY;  Surgeon: Eloise Harman, DO;  [facility];  Service: Endoscopy;;   CARPAL TUNNEL RELEASE Right    CHOLECYSTECTOMY  1990s   CLUB FOOT RELEASE Left    x3   COLONOSCOPY WITH PROPOFOL N/A 06/12/2014   Dr. Suzette Battiest colon polyps removed/moderate divertiiculosis/small internal hemorrhoids/moderate sized external hemorrhoids. tubular adenomas. Next surveillance in Oct 2018.    COLONOSCOPY WITH PROPOFOL N/A 09/14/2017   Dr. Oneida Alar: simple adenomas, moderate diverticulosis in rectosigmoid colon, sigmoid colon, and descending colon. External and internal hemorrhoids, normal colon biopsies. simple adenomas. Needs surveillance  in 3 years with Propofol, colowrap   COLONOSCOPY WITH PROPOFOL N/A 06/05/2021   Procedure: COLONOSCOPY WITH PROPOFOL;  Surgeon: Eloise Harman, DO;  [facility];  Service: Endoscopy;  Laterality: N/A;  7:30am   ESOPHAGOGASTRODUODENOSCOPY (EGD) WITH PROPOFOL N/A 06/12/2014   Dr. 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 benign gastric and duodenal biopsies   ESOPHAGOGASTRODUODENOSCOPY (EGD) WITH PROPOFOL N/A 06/05/2021   Procedure: ESOPHAGOGASTRODUODENOSCOPY (EGD) WITH PROPOFOL;  Surgeon: Eloise Harman, DO;  [facility];  Service: Endoscopy;  Laterality: N/A;   POLYPECTOMY N/A 06/12/2014   Procedure: POLYPECTOMY;  Surgeon: Danie Binder, MD;  Location: [facility];  Service: Endoscopy;  Laterality: N/A;   POLYPECTOMY  09/14/2017   Procedure: POLYPECTOMY;  Surgeon: Danie Binder, MD;  [facility];  Service: Endoscopy;;  colon   POLYPECTOMY  06/05/2021   Procedure: POLYPECTOMY;  Surgeon: Eloise Harman, DO;  [facility];  Service: Endoscopy;;   SAVORY DILATION N/A 06/12/2014   Procedure: SAVORY DILATION;  Surgeon: Danie Binder, MD;  Location: [facility];  Service: Endoscopy;  Laterality: N/A;  12.8-17    Social  History   Socioeconomic History   Marital status: Married    Spouse name: Erica Price   Number of children: 2   Years of education: Not on file   Highest education level: Associate degree: occupational, Hotel manager, or vocational program  Occupational History   Occupation: disabled  Tobacco Use   Smoking status: Former    Packs/day: 0.50    Years: 20.00    Total pack years: 10.00    Types: Cigarettes    Quit date: 10/18/2011    Years since quitting: 10.9   Smokeless tobacco: Never   Tobacco comments:    quit in 2014  Vaping Use   Vaping Use: Never used  Substance and Sexual Activity   Alcohol use: No   Drug use: No   Sexual activity: Not Currently    Birth control/protection:  Post-menopausal  Other Topics Concern   Not on file  Social History Narrative   She is currently unemployed.  She was terminated on 06/15/2013.  She was previously a Loss adjuster, chartered, worked at [employer], and worked in Charity fundraiser for 25 years.   She lives with husband.  They have two children.   Social Determinants of Health   Financial Resource Strain: Low Risk  (11/11/2017)   Overall Financial Resource Strain (CARDIA)    Difficulty of Paying Living Expenses: Not hard at all  Food Insecurity: No Food Insecurity (11/11/2017)   Hunger Vital Sign    Worried About Running Out of Food in the Last Year: Never true    Ran Out of Food in the Last Year: Never true  Transportation Needs: No Transportation Needs (11/11/2017)   PRAPARE - Hydrologist (Medical): No    Lack of Transportation (Non-Medical): No  Physical Activity: Insufficiently Active (11/11/2017)   Exercise Vital Sign    Days of Exercise per Week: 1 day    Minutes of Exercise per Session: 20 min  Stress: No Stress Concern Present (11/11/2017)   [place]    Feeling of Stress : Not at all  Social Connections: Moderately Integrated (11/11/2017)   Social Connection and Isolation Panel [NHANES]    Frequency of Communication with Friends and Family: More than three times a week    Frequency of Social Gatherings with Friends and Family: Once a week    Attends Religious Services: 1 to 4 times per year    Active Member of [employer] or Organizations: No    Attends Archivist Meetings: Never    Marital Status: Married    Family History  Problem Relation Age of Onset   Heart failure Father        Died, 67   Hypertension Mother        Living, 10   Breast cancer Mother    Kidney cancer Mother    Hypercholesterolemia Son    Breast cancer Sister    Pancreatic cancer Brother        passed age 59   Hypertension Sister    Colon cancer Neg Hx      Outpatient Encounter Medications as of 10/12/2022  Medication Sig   albuterol (PROVENTIL HFA;VENTOLIN HFA) 108 (90 Base) MCG/ACT inhaler Inhale 2 puffs into the lungs every 6 (six) hours as needed for wheezing or shortness of breath.   Alpha-D-Galactosidase (BEANO PO) Take by mouth.   celecoxib (CELEBREX) 200 MG capsule Take 200 mg by mouth 2 (two) times daily.   Dulaglutide (TRULICITY) 3 0000000 SOPN  Inject 3 mg as directed once a week.   DULoxetine (CYMBALTA) 60 MG capsule Take 60 mg daily by mouth.    feeding supplement, GLUCERNA SHAKE, (GLUCERNA SHAKE) LIQD Take 237 mLs by mouth daily.   fluticasone (FLONASE) 50 MCG/ACT nasal spray Place 2 sprays into both nostrils daily as needed for allergies or rhinitis.   furosemide (LASIX) 20 MG tablet Take 20 mg by mouth daily as needed for fluid.   gabapentin (NEURONTIN) 300 MG capsule Take 600 mg by mouth 3 (three) times daily.   glipiZIDE (GLUCOTROL XL) 5 MG 24 hr tablet Take 1 tablet (5 mg total) by mouth daily with breakfast.   insulin degludec (TRESIBA FLEXTOUCH) 100 UNIT/ML FlexTouch Pen Inject 55 Units into the skin at bedtime.   lidocaine (LIDODERM) 5 % 1 patch daily.   losartan (COZAAR) 25 MG tablet TAKE ONE TABLET BY MOUTH ONCE DAILY   modafinil (PROVIGIL) 100 MG tablet Take 200 mg by mouth daily.   Omega-3 Fatty Acids (OMEGA-3 FISH OIL PO) Take by mouth.   pantoprazole (PROTONIX) 40 MG tablet TAKE ONE TABLET BY MOUTH TWICE DAILY BEFORE A meal   rOPINIRole (REQUIP) 0.5 MG tablet Take 1 mg by mouth at bedtime.   rosuvastatin (CRESTOR) 10 MG tablet Take 1 tablet (10 mg total) by mouth daily.   simethicone (MYLICON) 0000000 MG chewable tablet Chew 125 mg by mouth every 6 (six) hours as needed for flatulence.   traMADol (ULTRAM) 50 MG tablet Take 100 mg by mouth every 6 (six) hours as needed for moderate pain.   VITAMIN D-VITAMIN K PO Take by mouth.   zolpidem (AMBIEN) 10 MG tablet Take 10 mg by mouth at bedtime.   No  facility-administered encounter medications on file as of 10/12/2022.    ALLERGIES: No Known Allergies  VACCINATION STATUS:  There is no immunization history on file for this patient.  Diabetes She presents for her follow-up diabetic visit. She has type 2 diabetes mellitus. Onset time: She was diagnosed at approximate age of 62 years. Her disease course has been improving. There are no hypoglycemic associated symptoms. Pertinent negatives for hypoglycemia include no confusion, headaches, pallor or seizures. Associated symptoms include fatigue. Pertinent negatives for diabetes include no chest pain, no polydipsia, no polyphagia, no polyuria and no weight loss. There are no hypoglycemic complications. Symptoms are stable. There are no diabetic complications. Risk factors for coronary artery disease include diabetes mellitus, obesity, sedentary lifestyle, post-menopausal, tobacco exposure, dyslipidemia and hypertension. Current diabetic treatment includes insulin injections and oral agent (monotherapy) (and Trulicity). She is compliant with treatment most of the time. Her weight is decreasing steadily. She is following a generally healthy diet. When asked about meal planning, she reported none. She has not had a previous visit with a dietitian. She rarely participates in exercise. Her home blood glucose trend is decreasing steadily. (She presents today with her CGM showing at goal glycemic profile overall.  Her POCT A1c today is 6.8%, improving from last visit of 7.3%.  Analysis of her CGM shows TIR 73%, TAR 27%, TBR 0% with a GMI of 7.4%.  She notes she has been on prednisone for inflammation recently.) An ACE inhibitor/angiotensin II receptor blocker is not being taken. She does not see a podiatrist.Eye exam is current.  Hyperlipidemia This is a chronic problem. The current episode started more than 1 year ago. The problem is controlled. Recent lipid tests were reviewed and are normal. Exacerbating  diseases include chronic renal disease, diabetes and obesity. Factors  aggravating her hyperlipidemia include fatty foods. Pertinent negatives include no chest pain. Current antihyperlipidemic treatment includes statins. The current treatment provides significant improvement of lipids. Compliance problems include adherence to exercise and adherence to diet.  Risk factors for coronary artery disease include diabetes mellitus, dyslipidemia, obesity, a sedentary lifestyle, post-menopausal and hypertension.  Hypertension This is a chronic problem. The current episode started more than 1 year ago. The problem has been gradually improving since onset. The problem is controlled. Pertinent negatives include no chest pain or headaches. There are no associated agents to hypertension. Risk factors for coronary artery disease include diabetes mellitus, dyslipidemia, obesity, post-menopausal state and sedentary lifestyle. Past treatments include angiotensin blockers and diuretics. The current treatment provides moderate improvement. There are no compliance problems.  Hypertensive end-organ damage includes kidney disease. Identifiable causes of hypertension include chronic renal disease and sleep apnea.     Review of systems  Constitutional: + steadily decreasing body weight,  current Body mass index is 38.92 kg/m. , + fatigue, no subjective hyperthermia, no subjective hypothermia Eyes: no blurry vision, no xerophthalmia ENT: no sore throat, no nodules palpated in throat, no dysphagia/odynophagia, no hoarseness Cardiovascular: no chest pain, no shortness of breath, no palpitations, no leg swelling Respiratory: no cough, no shortness of breath Gastrointestinal: no nausea/vomiting/diarrhea Musculoskeletal: no muscle/joint aches Skin: no rashes, no hyperemia Neurological: no tremors, no numbness, no tingling Psychiatric: no depression, no anxiety    Objective:    BP (!) 117/58 (BP Location: Right Arm, Patient  Position: Sitting, Cuff Size: Large)   Pulse 84   Ht 5' 2"$  (1.575 m)   Wt 212 lb 12.8 oz (96.5 kg)   BMI 38.92 kg/m   Wt Readings from Last 3 Encounters:  10/12/22 212 lb 12.8 oz (96.5 kg)  09/23/22 213 lb 6.4 oz (96.8 kg)  07/09/22 215 lb 9.6 oz (97.8 kg)    BP Readings from Last 3 Encounters:  10/12/22 (!) 117/58  09/23/22 115/67  07/09/22 120/78     Physical Exam- Limited  Constitutional:  Body mass index is 38.92 kg/m. , not in acute distress, normal state of mind Eyes:  EOMI, no exophthalmos Musculoskeletal: no gross deformities, strength intact in all four extremities, no gross restriction of joint movements Skin:  no rashes, no hyperemia Neurological: no tremor with outstretched hands   Diabetic Foot Exam - Simple   No data filed    CMP ( most recent) CMP     Component Value Date/Time   NA 135 05/27/2021 1345   K 4.6 05/27/2021 1345   CL 98 05/27/2021 1345   CO2 22 05/27/2021 1345   GLUCOSE 144 (H) 05/27/2021 1345   GLUCOSE 185 (H) 07/27/2017 0916   BUN 15 05/27/2021 1345   CREATININE 0.89 05/27/2021 1345   CALCIUM 9.2 05/27/2021 1345   PROT 7.2 05/27/2021 1345   ALBUMIN 4.3 05/27/2021 1345   AST 18 05/27/2021 1345   ALT 9 05/27/2021 1345   ALKPHOS 81 05/27/2021 1345   BILITOT 0.6 05/27/2021 1345   GFRNONAA 75 09/25/2020 0000   GFRAA 87 09/25/2020 0000    Lipid Panel     Component Value Date/Time   CHOL 110 09/25/2020 0000   CHOL 104 05/16/2020 1002   TRIG 101 10/27/2021 0000   HDL 47 09/25/2020 0000   HDL 47 05/16/2020 1002   LDLCALC 17 10/27/2021 0000   LDLCALC 37 05/16/2020 1002   LABVLDL 20 05/16/2020 1002      Assessment & Plan:   1) Uncontrolled  type 2 diabetes mellitus with hyperglycemia ([place])  - TYLIA GILLIAND has currently uncontrolled symptomatic type 2 DM since 62 years of age.  She presents today with her CGM showing at goal glycemic profile overall.  Her POCT A1c today is 6.8%, improving from last visit of 7.3%.   Analysis of her CGM shows TIR 73%, TAR 27%, TBR 0% with a GMI of 7.4%.  She notes she has been on prednisone for inflammation recently.  -her diabetes is complicated by obesity/sedentary life and she remains at a high risk for more acute and chronic complications which include CAD, CVA, CKD, retinopathy, and neuropathy. These are all discussed in detail with her.  - Nutritional counseling repeated at each appointment due to patients tendency to fall back in to old habits.  - The patient admits there is a room for improvement in their diet and drink choices. -  Suggestion is made for the patient to avoid simple carbohydrates from their diet including Cakes, Sweet Desserts / Pastries, Ice Cream, Soda (diet and regular), Sweet Tea, Candies, Chips, Cookies, Sweet Pastries, Store Bought Juices, Alcohol in Excess of 1-2 drinks a day, Artificial Sweeteners, Coffee Creamer, and "Sugar-free" Products. This will help patient to have stable blood glucose profile and potentially avoid unintended weight gain.   - I encouraged the patient to switch to unprocessed or minimally processed complex starch and increased protein intake (animal or plant source), fruits, and vegetables.   - Patient is advised to stick to a routine mealtimes to eat 3 meals a day and avoid unnecessary snacks (to snack only to correct hypoglycemia).  - she will be scheduled with Jearld Fenton, RDN, CDE for individualized diabetes education.  - I have approached her with the following individualized plan to manage diabetes and patient agrees:   -Based on her presenting glycemic profile, she will not need prandial insulin for now.    - She is advised to continue Tresiba 55 units SQ nightly and Glipizide 5 mg XL daily with breakfast.  and Trulicity 3 mg SQ weekly (she is tolerating this well). -She did not tolerate Metformin in the past, even in the ER form.    -She is encouraged to consistently monitor glucose twice daily, before  breakfast and before bed and call the clinic if she has readings less than 70 or greater than 200 for 3 tests in a row.  She is benefiting from her CGM.  - Patient specific target  A1c;  LDL, HDL, Triglycerides,  were discussed in detail.  2) Blood Pressure /Hypertension: Her blood pressure is controlled to target.  She is advised to continue Lasix 40 mg po daily prn for fluid and Losartan 25 mg po daily.  3) Lipids/Hyperlipidemia:   Her most recent lipid panel from 10/05/22 shows controlled LDL of 26.  She is advised to continue Crestor 10 mg po daily before bed.  Side effects and precautions discussed with her.   4)  Weight/Diet:  Her Body mass index is 38.92 kg/m.-   clearly complicating her diabetes care.  She is a candidate for modest weight loss.  I discussed with her the fact that loss of 5 - 10% of her  current body weight will have the most impact on her diabetes management.  CDE Consult will be initiated . Exercise, and detailed carbohydrates information provided  -  detailed on discharge instructions.  5) Chronic Care/Health Maintenance: -she on ARB and Statin medication and is encouraged to initiate and continue to follow up  with Ophthalmology, Dentist, Podiatrist at least yearly or according to recommendations, and advised to stay away from smoking. I have recommended yearly flu vaccine and pneumonia vaccine at least every 5 years; moderate intensity exercise for up to 150 minutes weekly; and  sleep for at least 7 hours a day.  - she is advised to maintain close follow up with Erica Sites, MD for primary care needs, as well as her other providers for optimal and coordinated care.     I spent  46  minutes in the care of the patient today including review of labs from [place], Lipids, Thyroid Function, Hematology (current and previous including abstractions from other facilities); face-to-face time discussing  her blood glucose readings/logs, discussing hypoglycemia and hyperglycemia  episodes and symptoms, medications doses, her options of short and long term treatment based on the latest standards of care / guidelines;  discussion about incorporating lifestyle medicine;  and documenting the encounter. Risk reduction counseling performed per USPSTF guidelines to reduce obesity and cardiovascular risk factors.     Please refer to Patient Instructions for Blood Glucose Monitoring and Insulin/Medications Dosing Guide"  in media tab for additional information. Please  also refer to " Patient Self Inventory" in the Media  tab for reviewed elements of pertinent patient history.  Allena Katz participated in the discussions, expressed understanding, and voiced agreement with the above plans.  All questions were answered to her satisfaction. she is encouraged to contact clinic should she have any questions or concerns prior to her return visit.    Follow up plan: - Return in about 4 months (around 02/10/2023) for Diabetes F/U with A1c in office, No previsit labs, Bring meter and logs.   Rayetta Pigg, [facility] [address] [place], [place] [address] Phone: [phone] Fax: [phone]  10/12/2022, 3:51 PM

## 2022-10-26 DIAGNOSIS — E119 Type 2 diabetes mellitus without complications: Secondary | ICD-10-CM | POA: Diagnosis not present

## 2022-11-03 ENCOUNTER — Other Ambulatory Visit (HOSPITAL_COMMUNITY): Payer: Self-pay | Admitting: Family Medicine

## 2022-11-03 DIAGNOSIS — G894 Chronic pain syndrome: Secondary | ICD-10-CM | POA: Diagnosis not present

## 2022-11-03 DIAGNOSIS — R6889 Other general symptoms and signs: Secondary | ICD-10-CM | POA: Diagnosis not present

## 2022-11-03 DIAGNOSIS — E1165 Type 2 diabetes mellitus with hyperglycemia: Secondary | ICD-10-CM | POA: Diagnosis not present

## 2022-11-03 DIAGNOSIS — J069 Acute upper respiratory infection, unspecified: Secondary | ICD-10-CM | POA: Diagnosis not present

## 2022-11-03 DIAGNOSIS — E1159 Type 2 diabetes mellitus with other circulatory complications: Secondary | ICD-10-CM

## 2022-11-03 DIAGNOSIS — M797 Fibromyalgia: Secondary | ICD-10-CM | POA: Diagnosis not present

## 2022-11-03 DIAGNOSIS — E538 Deficiency of other specified B group vitamins: Secondary | ICD-10-CM | POA: Diagnosis not present

## 2022-11-06 ENCOUNTER — Other Ambulatory Visit (HOSPITAL_COMMUNITY): Payer: Medicare Other

## 2022-12-04 DIAGNOSIS — E1165 Type 2 diabetes mellitus with hyperglycemia: Secondary | ICD-10-CM | POA: Diagnosis not present

## 2022-12-22 DIAGNOSIS — E1159 Type 2 diabetes mellitus with other circulatory complications: Secondary | ICD-10-CM | POA: Diagnosis not present

## 2022-12-22 DIAGNOSIS — M1990 Unspecified osteoarthritis, unspecified site: Secondary | ICD-10-CM | POA: Diagnosis not present

## 2022-12-22 DIAGNOSIS — I10 Essential (primary) hypertension: Secondary | ICD-10-CM | POA: Diagnosis not present

## 2022-12-22 DIAGNOSIS — E538 Deficiency of other specified B group vitamins: Secondary | ICD-10-CM | POA: Diagnosis not present

## 2022-12-22 DIAGNOSIS — E782 Mixed hyperlipidemia: Secondary | ICD-10-CM | POA: Diagnosis not present

## 2022-12-22 DIAGNOSIS — G894 Chronic pain syndrome: Secondary | ICD-10-CM | POA: Diagnosis not present

## 2022-12-22 DIAGNOSIS — E1165 Type 2 diabetes mellitus with hyperglycemia: Secondary | ICD-10-CM | POA: Diagnosis not present

## 2022-12-22 DIAGNOSIS — M797 Fibromyalgia: Secondary | ICD-10-CM | POA: Diagnosis not present

## 2022-12-28 DIAGNOSIS — G4733 Obstructive sleep apnea (adult) (pediatric): Secondary | ICD-10-CM | POA: Diagnosis not present

## 2023-01-03 DIAGNOSIS — E1165 Type 2 diabetes mellitus with hyperglycemia: Secondary | ICD-10-CM | POA: Diagnosis not present

## 2023-01-04 ENCOUNTER — Ambulatory Visit (HOSPITAL_COMMUNITY)
Admission: RE | Admit: 2023-01-04 | Discharge: 2023-01-04 | Disposition: A | Discharge status: Home / Self Care | Payer: Medicare Other | Source: Ambulatory Visit | Attending: Family Medicine | Admitting: Family Medicine

## 2023-01-04 DIAGNOSIS — Z780 Asymptomatic menopausal state: Secondary | ICD-10-CM | POA: Insufficient documentation

## 2023-01-04 DIAGNOSIS — M8589 Other specified disorders of bone density and structure, multiple sites: Secondary | ICD-10-CM | POA: Diagnosis not present

## 2023-01-04 DIAGNOSIS — E1159 Type 2 diabetes mellitus with other circulatory complications: Secondary | ICD-10-CM | POA: Diagnosis not present

## 2023-01-04 DIAGNOSIS — M8588 Other specified disorders of bone density and structure, other site: Secondary | ICD-10-CM | POA: Diagnosis not present

## 2023-01-21 DIAGNOSIS — E538 Deficiency of other specified B group vitamins: Secondary | ICD-10-CM | POA: Diagnosis not present

## 2023-01-21 DIAGNOSIS — M797 Fibromyalgia: Secondary | ICD-10-CM | POA: Diagnosis not present

## 2023-01-21 DIAGNOSIS — G894 Chronic pain syndrome: Secondary | ICD-10-CM | POA: Diagnosis not present

## 2023-02-03 DIAGNOSIS — E1165 Type 2 diabetes mellitus with hyperglycemia: Secondary | ICD-10-CM | POA: Diagnosis not present

## 2023-02-09 DIAGNOSIS — G894 Chronic pain syndrome: Secondary | ICD-10-CM | POA: Diagnosis not present

## 2023-02-09 DIAGNOSIS — M7712 Lateral epicondylitis, left elbow: Secondary | ICD-10-CM | POA: Diagnosis not present

## 2023-02-09 DIAGNOSIS — M7551 Bursitis of right shoulder: Secondary | ICD-10-CM | POA: Diagnosis not present

## 2023-02-09 DIAGNOSIS — G5602 Carpal tunnel syndrome, left upper limb: Secondary | ICD-10-CM | POA: Diagnosis not present

## 2023-02-09 DIAGNOSIS — Z79891 Long term (current) use of opiate analgesic: Secondary | ICD-10-CM | POA: Diagnosis not present

## 2023-02-10 ENCOUNTER — Encounter: Payer: Self-pay | Admitting: Nurse Practitioner

## 2023-02-10 ENCOUNTER — Ambulatory Visit (INDEPENDENT_AMBULATORY_CARE_PROVIDER_SITE_OTHER): Payer: Medicare Other | Admitting: Nurse Practitioner

## 2023-02-10 VITALS — BP 120/86 | HR 84 | Ht 62.0 in | Wt 212.4 lb

## 2023-02-10 DIAGNOSIS — Z7985 Long-term (current) use of injectable non-insulin antidiabetic drugs: Secondary | ICD-10-CM | POA: Diagnosis not present

## 2023-02-10 DIAGNOSIS — Z7984 Long term (current) use of oral hypoglycemic drugs: Secondary | ICD-10-CM

## 2023-02-10 DIAGNOSIS — Z794 Long term (current) use of insulin: Secondary | ICD-10-CM

## 2023-02-10 DIAGNOSIS — E1165 Type 2 diabetes mellitus with hyperglycemia: Secondary | ICD-10-CM | POA: Diagnosis not present

## 2023-02-10 DIAGNOSIS — E782 Mixed hyperlipidemia: Secondary | ICD-10-CM | POA: Diagnosis not present

## 2023-02-10 LAB — POCT GLYCOSYLATED HEMOGLOBIN (HGB A1C): Hemoglobin A1C: 7.1 % — AB (ref 4.0–5.6)

## 2023-02-10 MED ORDER — GLIPIZIDE ER 5 MG PO TB24: 5.0000 mg | ORAL_TABLET | Freq: Every day | ORAL | 3 refills | Status: DC

## 2023-02-10 MED ORDER — TRESIBA FLEXTOUCH 100 UNIT/ML ~~LOC~~ SOPN: 55.0000 [IU] | PEN_INJECTOR | Freq: Every day | SUBCUTANEOUS | 3 refills | Status: DC

## 2023-02-10 NOTE — Progress Notes (Signed)
02/10/2023, 2:39 PM  Endocrinology follow-up note   Subjective:    Patient ID: Erica Price, female    DOB: 08-Aug-1961.  Erica Price is being seen  in follow-up for management of currently uncontrolled symptomatic diabetes requested by  Assunta Found, MD.   Past Medical History:  Diagnosis Date   Apnea    Poss OSA   Arthralgia    Arthritis    Depression    Diabetes mellitus without complication (HCC)    Fatigue    Fibromyalgia    GERD (gastroesophageal reflux disease)    Hypertension    IBS (irritable bowel syndrome)    Myalgia    Pinched nerve    lumbar-l5-[id number]   PONV (postoperative nausea and vomiting)    Snoring     Past Surgical History:  Procedure Laterality Date   ARTHROSCOPIC REPAIR ACL Left    BALLOON DILATION N/A 06/05/2021   Procedure: BALLOON DILATION;  Surgeon: Lanelle Bal, DO;  [facility];  Service: Endoscopy;  Laterality: N/A;   BIOPSY N/A 06/12/2014   Procedure: BIOPSY;  Surgeon: West Bali, MD;  Location: [facility];  Service: Endoscopy;  Laterality: N/A;   BIOPSY  09/14/2017   Procedure: BIOPSY;  Surgeon: West Bali, MD;  [facility];  Service: Endoscopy;;  colon   BIOPSY  06/05/2021   Procedure: BIOPSY;  Surgeon: Lanelle Bal, DO;  [facility];  Service: Endoscopy;;   CARPAL TUNNEL RELEASE Right    CHOLECYSTECTOMY  1990s   CLUB FOOT RELEASE Left    x3   COLONOSCOPY WITH PROPOFOL N/A 06/12/2014   Dr. Jeralyn Bennett colon polyps removed/moderate divertiiculosis/small internal hemorrhoids/moderate sized external hemorrhoids. tubular adenomas. Next surveillance in Oct 2018.    COLONOSCOPY WITH PROPOFOL N/A 09/14/2017   Dr. Darrick Penna: simple adenomas, moderate diverticulosis in rectosigmoid colon, sigmoid colon, and descending colon. External and internal hemorrhoids, normal colon biopsies. simple adenomas. Needs surveillance  in 3 years with Propofol, colowrap   COLONOSCOPY WITH PROPOFOL N/A 06/05/2021   Procedure: COLONOSCOPY WITH PROPOFOL;  Surgeon: Lanelle Bal, DO;  [facility];  Service: Endoscopy;  Laterality: N/A;  7:30am   ESOPHAGOGASTRODUODENOSCOPY (EGD) WITH PROPOFOL N/A 06/12/2014   Dr. Cyndi Bender non-erosive gastritis, stricture at GE junction s/p savary dilation. benign gastric and duodenal biopsies   ESOPHAGOGASTRODUODENOSCOPY (EGD) WITH PROPOFOL N/A 06/05/2021   Procedure: ESOPHAGOGASTRODUODENOSCOPY (EGD) WITH PROPOFOL;  Surgeon: Lanelle Bal, DO;  [facility];  Service: Endoscopy;  Laterality: N/A;   POLYPECTOMY N/A 06/12/2014   Procedure: POLYPECTOMY;  Surgeon: West Bali, MD;  Location: [facility];  Service: Endoscopy;  Laterality: N/A;   POLYPECTOMY  09/14/2017   Procedure: POLYPECTOMY;  Surgeon: West Bali, MD;  [facility];  Service: Endoscopy;;  colon   POLYPECTOMY  06/05/2021   Procedure: POLYPECTOMY;  Surgeon: Lanelle Bal, DO;  [facility];  Service: Endoscopy;;   SAVORY DILATION N/A 06/12/2014   Procedure: SAVORY DILATION;  Surgeon: West Bali, MD;  Location: [facility];  Service: Endoscopy;  Laterality: N/A;  12.8-17    Social  History   Socioeconomic History   Marital status: Married    Spouse name: Maisie Fus   Number of children: 2   Years of education: Not on file   Highest education level: Associate degree: occupational, Scientist, product/process development, or vocational program  Occupational History   Occupation: disabled  Tobacco Use   Smoking status: Former    Packs/day: 0.50    Years: 20.00    Additional pack years: 0.00    Total pack years: 10.00    Types: Cigarettes    Quit date: 10/18/2011    Years since quitting: 11.3   Smokeless tobacco: Never   Tobacco comments:    quit in 2014  Vaping Use   Vaping Use: Never used  Substance and Sexual Activity   Alcohol use: No   Drug use: No   Sexual activity: Not Currently     Birth control/protection: Post-menopausal  Other Topics Concern   Not on file  Social History Narrative   She is currently unemployed.  She was terminated on 06/15/2013.  She was previously a Architect, worked at [employer], and worked in Designer, fashion/clothing for 25 years.   She lives with husband.  They have two children.   Social Determinants of Health   Financial Resource Strain: Low Risk  (11/11/2017)   Overall Financial Resource Strain (CARDIA)    Difficulty of Paying Living Expenses: Not hard at all  Food Insecurity: No Food Insecurity (11/11/2017)   Hunger Vital Sign    Worried About Running Out of Food in the Last Year: Never true    Ran Out of Food in the Last Year: Never true  Transportation Needs: No Transportation Needs (11/11/2017)   PRAPARE - Administrator, Civil Service (Medical): No    Lack of Transportation (Non-Medical): No  Physical Activity: Insufficiently Active (11/11/2017)   Exercise Vital Sign    Days of Exercise per Week: 1 day    Minutes of Exercise per Session: 20 min  Stress: No Stress Concern Present (11/11/2017)   [employer] of Occupational Health - Occupational Stress Questionnaire    Feeling of Stress : Not at all  Social Connections: Moderately Integrated (11/11/2017)   Social Connection and Isolation Panel [NHANES]    Frequency of Communication with Friends and Family: More than three times a week    Frequency of Social Gatherings with Friends and Family: Once a week    Attends Religious Services: 1 to 4 times per year    Active Member of [employer] or Organizations: No    Attends Banker Meetings: Never    Marital Status: Married    Family History  Problem Relation Age of Onset   Heart failure Father        Died, 51   Hypertension Mother        Living, 10   Breast cancer Mother    Kidney cancer Mother    Hypercholesterolemia Son    Breast cancer Sister    Pancreatic cancer Brother        passed age 47   Hypertension  Sister    Colon cancer Neg Hx     Outpatient Encounter Medications as of 02/10/2023  Medication Sig   albuterol (PROVENTIL HFA;VENTOLIN HFA) 108 (90 Base) MCG/ACT inhaler Inhale 2 puffs into the lungs every 6 (six) hours as needed for wheezing or shortness of breath.   Alpha-D-Galactosidase (BEANO PO) Take by mouth.   celecoxib (CELEBREX) 200 MG capsule Take 200 mg by mouth 2 (two) times daily.  DULoxetine (CYMBALTA) 60 MG capsule Take 60 mg daily by mouth.    feeding supplement, GLUCERNA SHAKE, (GLUCERNA SHAKE) LIQD Take 237 mLs by mouth daily.   fluticasone (FLONASE) 50 MCG/ACT nasal spray Place 2 sprays into both nostrils daily as needed for allergies or rhinitis.   furosemide (LASIX) 20 MG tablet Take 20 mg by mouth daily as needed for fluid.   gabapentin (NEURONTIN) 300 MG capsule Take 600 mg by mouth 3 (three) times daily.   HYDROcodone-acetaminophen (NORCO) 7.5-325 MG tablet Take 1 tablet by mouth 2 (two) times daily as needed.   lidocaine (LIDODERM) 5 % 1 patch daily.   losartan (COZAAR) 25 MG tablet TAKE ONE TABLET BY MOUTH ONCE DAILY   modafinil (PROVIGIL) 100 MG tablet Take 200 mg by mouth daily.   Omega-3 Fatty Acids (OMEGA-3 FISH OIL PO) Take by mouth.   pantoprazole (PROTONIX) 40 MG tablet TAKE ONE TABLET BY MOUTH TWICE DAILY BEFORE A meal   rOPINIRole (REQUIP) 0.5 MG tablet Take 1 mg by mouth at bedtime.   rosuvastatin (CRESTOR) 10 MG tablet Take 1 tablet (10 mg total) by mouth daily.   simethicone (MYLICON) 125 MG chewable tablet Chew 125 mg by mouth every 6 (six) hours as needed for flatulence.   Suvorexant (BELSOMRA) 20 MG TABS Take 20 mg by mouth at bedtime.   VITAMIN D-VITAMIN K PO Take by mouth.   [DISCONTINUED] Dulaglutide (TRULICITY) 3 MG/0.5ML SOPN Inject 3 mg as directed once a week.   [DISCONTINUED] glipiZIDE (GLUCOTROL XL) 5 MG 24 hr tablet Take 1 tablet (5 mg total) by mouth daily with breakfast.   [DISCONTINUED] insulin degludec (TRESIBA FLEXTOUCH) 100 UNIT/ML  FlexTouch Pen Inject 55 Units into the skin at bedtime.   glipiZIDE (GLUCOTROL XL) 5 MG 24 hr tablet Take 1 tablet (5 mg total) by mouth daily with breakfast.   insulin degludec (TRESIBA FLEXTOUCH) 100 UNIT/ML FlexTouch Pen Inject 55 Units into the skin at bedtime.   traMADol (ULTRAM) 50 MG tablet Take 100 mg by mouth every 6 (six) hours as needed for moderate pain. (Patient not taking: Reported on 02/10/2023)   zolpidem (AMBIEN) 10 MG tablet Take 10 mg by mouth at bedtime. (Patient not taking: Reported on 02/10/2023)   No facility-administered encounter medications on file as of 02/10/2023.    ALLERGIES: No Known Allergies  VACCINATION STATUS:  There is no immunization history on file for this patient.  Diabetes She presents for her follow-up diabetic visit. She has type 2 diabetes mellitus. Onset time: She was diagnosed at approximate age of 52 years. Her disease course has been stable. There are no hypoglycemic associated symptoms. Pertinent negatives for hypoglycemia include no confusion, headaches, pallor or seizures. Associated symptoms include fatigue. Pertinent negatives for diabetes include no chest pain, no polydipsia, no polyphagia, no polyuria and no weight loss. There are no hypoglycemic complications. Symptoms are stable. There are no diabetic complications. Risk factors for coronary artery disease include diabetes mellitus, obesity, sedentary lifestyle, post-menopausal, tobacco exposure, dyslipidemia and hypertension. Current diabetic treatment includes insulin injections and oral agent (monotherapy) (and Trulicity). She is compliant with treatment most of the time. Her weight is fluctuating minimally. She is following a generally healthy diet. When asked about meal planning, she reported none. She has not had a previous visit with a dietitian. She rarely participates in exercise. Her home blood glucose trend is fluctuating minimally. Her overall blood glucose range is 140-180 mg/dl.  (She presents today with her CGM showing at goal glycemic profile.  Her POCT A1c today is 7.1%, increasing slightly from last visit of 6.8%.  She notes she had COVID back in May and her glucose went crazy high during that time but it has leveled back off now.  Analysis of her CGM shows TIR 79%, TAR 21%, TBR 0% with a GMI of 6.9%.  She denies any hypoglycemia.  She is interested in changing to [facility], has brought patient assistance application with her today.) An ACE inhibitor/angiotensin II receptor blocker is not being taken. She does not see a podiatrist.Eye exam is current.  Hyperlipidemia This is a chronic problem. The current episode started more than 1 year ago. The problem is controlled. Recent lipid tests were reviewed and are normal. Exacerbating diseases include chronic renal disease, diabetes and obesity. Factors aggravating her hyperlipidemia include fatty foods. Pertinent negatives include no chest pain. Current antihyperlipidemic treatment includes statins. The current treatment provides significant improvement of lipids. Compliance problems include adherence to exercise and adherence to diet.  Risk factors for coronary artery disease include diabetes mellitus, dyslipidemia, obesity, a sedentary lifestyle, post-menopausal and hypertension.  Hypertension This is a chronic problem. The current episode started more than 1 year ago. The problem has been gradually improving since onset. The problem is controlled. Pertinent negatives include no chest pain or headaches. There are no associated agents to hypertension. Risk factors for coronary artery disease include diabetes mellitus, dyslipidemia, obesity, post-menopausal state and sedentary lifestyle. Past treatments include angiotensin blockers and diuretics. The current treatment provides moderate improvement. There are no compliance problems.  Hypertensive end-organ damage includes kidney disease. Identifiable causes of hypertension include chronic  renal disease and sleep apnea.     Review of systems  Constitutional: + Minimally fluctuating body weight,  current Body mass index is 38.85 kg/m. , no fatigue, no subjective hyperthermia, no subjective hypothermia Eyes: no blurry vision, no xerophthalmia ENT: no sore throat, no nodules palpated in throat, no dysphagia/odynophagia, no hoarseness Cardiovascular: no chest pain, no shortness of breath, no palpitations, no leg swelling Respiratory: no cough, no shortness of breath Gastrointestinal: no nausea/vomiting/diarrhea Musculoskeletal: no muscle/joint aches Skin: no rashes, no hyperemia Neurological: no tremors, no numbness, no tingling, no dizziness Psychiatric: no depression, no anxiety    Objective:    BP 120/86 (BP Location: Left Arm, Patient Position: Sitting, Cuff Size: Large)   Pulse 84   Ht 5\' 2"  (1.575 m)   Wt 212 lb 6.4 oz (96.3 kg)   BMI 38.85 kg/m   Wt Readings from Last 3 Encounters:  02/10/23 212 lb 6.4 oz (96.3 kg)  10/12/22 212 lb 12.8 oz (96.5 kg)  09/23/22 213 lb 6.4 oz (96.8 kg)    BP Readings from Last 3 Encounters:  02/10/23 120/86  10/12/22 (!) 117/58  09/23/22 115/67      Physical Exam- Limited  Constitutional:  Body mass index is 38.85 kg/m. , not in acute distress, normal state of mind Eyes:  EOMI, no exophthalmos Musculoskeletal: no gross deformities, strength intact in all four extremities, no gross restriction of joint movements Skin:  no rashes, no hyperemia Neurological: no tremor with outstretched hands   Diabetic Foot Exam - Simple   No data filed    CMP ( most recent) CMP     Component Value Date/Time   NA 135 05/27/2021 1345   K 4.6 05/27/2021 1345   CL 98 05/27/2021 1345   CO2 22 05/27/2021 1345   GLUCOSE 144 (H) 05/27/2021 1345   GLUCOSE 185 (H) 07/27/2017 0916   BUN  15 05/27/2021 1345   CREATININE 0.89 05/27/2021 1345   CALCIUM 9.2 05/27/2021 1345   PROT 7.2 05/27/2021 1345   ALBUMIN 4.3 05/27/2021 1345    AST 18 05/27/2021 1345   ALT 9 05/27/2021 1345   ALKPHOS 81 05/27/2021 1345   BILITOT 0.6 05/27/2021 1345   GFRNONAA 75 09/25/2020 0000   GFRAA 87 09/25/2020 0000    Lipid Panel     Component Value Date/Time   CHOL 110 09/25/2020 0000   CHOL 104 05/16/2020 1002   TRIG 101 10/27/2021 0000   HDL 47 09/25/2020 0000   HDL 47 05/16/2020 1002   LDLCALC 17 10/27/2021 0000   LDLCALC 37 05/16/2020 1002   LABVLDL 20 05/16/2020 1002      Assessment & Plan:   1) Controlled type 2 diabetes mellitus without complication  - MOSHE BOAK has currently uncontrolled symptomatic type 2 DM since 62 years of age.  She presents today with her CGM showing at goal glycemic profile.  Her POCT A1c today is 7.1%, increasing slightly from last visit of 6.8%.  She notes she had COVID back in May and her glucose went crazy high during that time but it has leveled back off now.  Analysis of her CGM shows TIR 79%, TAR 21%, TBR 0% with a GMI of 6.9%.  She denies any hypoglycemia.  She is interested in changing to Ozempic, has brought patient assistance application with her today.  -her diabetes is complicated by obesity/sedentary life and she remains at a high risk for more acute and chronic complications which include CAD, CVA, CKD, retinopathy, and neuropathy. These are all discussed in detail with her.  - Nutritional counseling repeated at each appointment due to patients tendency to fall back in to old habits.  - The patient admits there is a room for improvement in their diet and drink choices. -  Suggestion is made for the patient to avoid simple carbohydrates from their diet including Cakes, Sweet Desserts / Pastries, Ice Cream, Soda (diet and regular), Sweet Tea, Candies, Chips, Cookies, Sweet Pastries, Store Bought Juices, Alcohol in Excess of 1-2 drinks a day, Artificial Sweeteners, Coffee Creamer, and "Sugar-free" Products. This will help patient to have stable blood glucose profile and potentially  avoid unintended weight gain.   - I encouraged the patient to switch to unprocessed or minimally processed complex starch and increased protein intake (animal or plant source), fruits, and vegetables.   - Patient is advised to stick to a routine mealtimes to eat 3 meals a day and avoid unnecessary snacks (to snack only to correct hypoglycemia).  - I have approached her with the following individualized plan to manage diabetes and patient agrees:   -Based on her presenting glycemic profile, she will not need prandial insulin for now.    - She is advised to continue Tresiba 55 units SQ nightly, Glipizide 5 mg XL daily with breakfast and Trulicity 3 mg SQ weekly (until she depletes her current supply).  We will work on changing her to Ozempic 0.5 mg SQ weekly (will fill out PAP form and send to company for both the [place] and the [place]).  -She did not tolerate Metformin in the past, even in the ER form.    -She is encouraged to consistently monitor glucose twice daily, before breakfast and before bed and call the clinic if she has readings less than 70 or greater than 200 for 3 tests in a row.  She is benefiting from her CGM, is  advised to continue using it.  - Patient specific target  A1c;  LDL, HDL, Triglycerides,  were discussed in detail.  2) Blood Pressure /Hypertension: Her blood pressure is controlled to target.  She is advised to continue Lasix 40 mg po daily prn for fluid and Losartan 25 mg po daily.  3) Lipids/Hyperlipidemia:   Her most recent lipid panel from 10/05/22 shows controlled LDL of 26.  She is advised to continue Crestor 10 mg po daily before bed.  Side effects and precautions discussed with her.   4)  Weight/Diet:  Her Body mass index is 38.85 kg/m.-   clearly complicating her diabetes care.  She is a candidate for modest weight loss.  I discussed with her the fact that loss of 5 - 10% of her  current body weight will have the most impact on her diabetes management.   CDE Consult will be initiated . Exercise, and detailed carbohydrates information provided  -  detailed on discharge instructions.  5) Chronic Care/Health Maintenance: -she on ARB and Statin medication and is encouraged to initiate and continue to follow up with Ophthalmology, Dentist, Podiatrist at least yearly or according to recommendations, and advised to stay away from smoking. I have recommended yearly flu vaccine and pneumonia vaccine at least every 5 years; moderate intensity exercise for up to 150 minutes weekly; and  sleep for at least 7 hours a day.  - she is advised to maintain close follow up with Assunta Found, MD for primary care needs, as well as her other providers for optimal and coordinated care.      I spent  43  minutes in the care of the patient today including review of labs from CMP, Lipids, Thyroid Function, Hematology (current and previous including abstractions from other facilities); face-to-face time discussing  her blood glucose readings/logs, discussing hypoglycemia and hyperglycemia episodes and symptoms, medications doses, her options of short and long term treatment based on the latest standards of care / guidelines;  discussion about incorporating lifestyle medicine;  and documenting the encounter. Risk reduction counseling performed per USPSTF guidelines to reduce obesity and cardiovascular risk factors.     Please refer to Patient Instructions for Blood Glucose Monitoring and Insulin/Medications Dosing Guide"  in media tab for additional information. Please  also refer to " Patient Self Inventory" in the Media  tab for reviewed elements of pertinent patient history.  Adalberto Cole participated in the discussions, expressed understanding, and voiced agreement with the above plans.  All questions were answered to her satisfaction. she is encouraged to contact clinic should she have any questions or concerns prior to her return visit.    Follow up plan: - Return  in about 3 months (around 05/13/2023) for Diabetes F/U with A1c in office, No previsit labs, Bring meter and logs.   Ronny Bacon, [facility] [address] [place], [place] [address] Phone: [phone] Fax: [phone]  02/10/2023, 2:39 PM

## 2023-02-21 DIAGNOSIS — M1990 Unspecified osteoarthritis, unspecified site: Secondary | ICD-10-CM | POA: Diagnosis not present

## 2023-02-21 DIAGNOSIS — E1165 Type 2 diabetes mellitus with hyperglycemia: Secondary | ICD-10-CM | POA: Diagnosis not present

## 2023-02-21 DIAGNOSIS — E782 Mixed hyperlipidemia: Secondary | ICD-10-CM | POA: Diagnosis not present

## 2023-02-21 DIAGNOSIS — I10 Essential (primary) hypertension: Secondary | ICD-10-CM | POA: Diagnosis not present

## 2023-03-02 DIAGNOSIS — M62511 Muscle wasting and atrophy, not elsewhere classified, right shoulder: Secondary | ICD-10-CM | POA: Diagnosis not present

## 2023-03-02 DIAGNOSIS — M25511 Pain in right shoulder: Secondary | ICD-10-CM | POA: Diagnosis not present

## 2023-03-02 DIAGNOSIS — M25611 Stiffness of right shoulder, not elsewhere classified: Secondary | ICD-10-CM | POA: Diagnosis not present

## 2023-03-02 DIAGNOSIS — M5416 Radiculopathy, lumbar region: Secondary | ICD-10-CM | POA: Diagnosis not present

## 2023-03-02 DIAGNOSIS — M62551 Muscle wasting and atrophy, not elsewhere classified, right thigh: Secondary | ICD-10-CM | POA: Diagnosis not present

## 2023-03-04 ENCOUNTER — Telehealth: Payer: Self-pay | Admitting: Nurse Practitioner

## 2023-03-04 NOTE — Telephone Encounter (Signed)
Pt called that her pt assistance was here for p/u

## 2023-03-04 NOTE — Telephone Encounter (Signed)
Pt picked up.

## 2023-03-05 DIAGNOSIS — M5416 Radiculopathy, lumbar region: Secondary | ICD-10-CM | POA: Diagnosis not present

## 2023-03-05 DIAGNOSIS — E1165 Type 2 diabetes mellitus with hyperglycemia: Secondary | ICD-10-CM | POA: Diagnosis not present

## 2023-03-05 DIAGNOSIS — M25611 Stiffness of right shoulder, not elsewhere classified: Secondary | ICD-10-CM | POA: Diagnosis not present

## 2023-03-05 DIAGNOSIS — M62511 Muscle wasting and atrophy, not elsewhere classified, right shoulder: Secondary | ICD-10-CM | POA: Diagnosis not present

## 2023-03-05 DIAGNOSIS — M62551 Muscle wasting and atrophy, not elsewhere classified, right thigh: Secondary | ICD-10-CM | POA: Diagnosis not present

## 2023-03-05 DIAGNOSIS — M25511 Pain in right shoulder: Secondary | ICD-10-CM | POA: Diagnosis not present

## 2023-03-09 ENCOUNTER — Encounter: Payer: Self-pay | Admitting: Nurse Practitioner

## 2023-03-10 DIAGNOSIS — M62551 Muscle wasting and atrophy, not elsewhere classified, right thigh: Secondary | ICD-10-CM | POA: Diagnosis not present

## 2023-03-10 DIAGNOSIS — M25611 Stiffness of right shoulder, not elsewhere classified: Secondary | ICD-10-CM | POA: Diagnosis not present

## 2023-03-10 DIAGNOSIS — M5416 Radiculopathy, lumbar region: Secondary | ICD-10-CM | POA: Diagnosis not present

## 2023-03-10 DIAGNOSIS — M62511 Muscle wasting and atrophy, not elsewhere classified, right shoulder: Secondary | ICD-10-CM | POA: Diagnosis not present

## 2023-03-10 DIAGNOSIS — M25511 Pain in right shoulder: Secondary | ICD-10-CM | POA: Diagnosis not present

## 2023-03-15 DIAGNOSIS — M7551 Bursitis of right shoulder: Secondary | ICD-10-CM | POA: Diagnosis not present

## 2023-03-15 DIAGNOSIS — G5602 Carpal tunnel syndrome, left upper limb: Secondary | ICD-10-CM | POA: Diagnosis not present

## 2023-03-15 DIAGNOSIS — M7712 Lateral epicondylitis, left elbow: Secondary | ICD-10-CM | POA: Diagnosis not present

## 2023-03-15 DIAGNOSIS — G894 Chronic pain syndrome: Secondary | ICD-10-CM | POA: Diagnosis not present

## 2023-03-15 DIAGNOSIS — Z79891 Long term (current) use of opiate analgesic: Secondary | ICD-10-CM | POA: Diagnosis not present

## 2023-03-18 ENCOUNTER — Other Ambulatory Visit (HOSPITAL_COMMUNITY): Payer: Self-pay | Admitting: Physical Medicine and Rehabilitation

## 2023-03-18 DIAGNOSIS — M5416 Radiculopathy, lumbar region: Secondary | ICD-10-CM

## 2023-03-22 DIAGNOSIS — M5416 Radiculopathy, lumbar region: Secondary | ICD-10-CM | POA: Diagnosis not present

## 2023-03-22 DIAGNOSIS — M62511 Muscle wasting and atrophy, not elsewhere classified, right shoulder: Secondary | ICD-10-CM | POA: Diagnosis not present

## 2023-03-22 DIAGNOSIS — M25511 Pain in right shoulder: Secondary | ICD-10-CM | POA: Diagnosis not present

## 2023-03-22 DIAGNOSIS — M62551 Muscle wasting and atrophy, not elsewhere classified, right thigh: Secondary | ICD-10-CM | POA: Diagnosis not present

## 2023-03-22 DIAGNOSIS — M25611 Stiffness of right shoulder, not elsewhere classified: Secondary | ICD-10-CM | POA: Diagnosis not present

## 2023-03-24 DIAGNOSIS — M5416 Radiculopathy, lumbar region: Secondary | ICD-10-CM | POA: Diagnosis not present

## 2023-03-24 DIAGNOSIS — M25511 Pain in right shoulder: Secondary | ICD-10-CM | POA: Diagnosis not present

## 2023-03-24 DIAGNOSIS — M25611 Stiffness of right shoulder, not elsewhere classified: Secondary | ICD-10-CM | POA: Diagnosis not present

## 2023-03-24 DIAGNOSIS — M62511 Muscle wasting and atrophy, not elsewhere classified, right shoulder: Secondary | ICD-10-CM | POA: Diagnosis not present

## 2023-03-24 DIAGNOSIS — M62551 Muscle wasting and atrophy, not elsewhere classified, right thigh: Secondary | ICD-10-CM | POA: Diagnosis not present

## 2023-03-29 DIAGNOSIS — M25611 Stiffness of right shoulder, not elsewhere classified: Secondary | ICD-10-CM | POA: Diagnosis not present

## 2023-03-29 DIAGNOSIS — G4733 Obstructive sleep apnea (adult) (pediatric): Secondary | ICD-10-CM | POA: Diagnosis not present

## 2023-03-29 DIAGNOSIS — M62551 Muscle wasting and atrophy, not elsewhere classified, right thigh: Secondary | ICD-10-CM | POA: Diagnosis not present

## 2023-03-29 DIAGNOSIS — M25511 Pain in right shoulder: Secondary | ICD-10-CM | POA: Diagnosis not present

## 2023-03-29 DIAGNOSIS — M62511 Muscle wasting and atrophy, not elsewhere classified, right shoulder: Secondary | ICD-10-CM | POA: Diagnosis not present

## 2023-03-29 DIAGNOSIS — M5416 Radiculopathy, lumbar region: Secondary | ICD-10-CM | POA: Diagnosis not present

## 2023-03-31 DIAGNOSIS — M25511 Pain in right shoulder: Secondary | ICD-10-CM | POA: Diagnosis not present

## 2023-03-31 DIAGNOSIS — M62551 Muscle wasting and atrophy, not elsewhere classified, right thigh: Secondary | ICD-10-CM | POA: Diagnosis not present

## 2023-03-31 DIAGNOSIS — M62511 Muscle wasting and atrophy, not elsewhere classified, right shoulder: Secondary | ICD-10-CM | POA: Diagnosis not present

## 2023-03-31 DIAGNOSIS — M5416 Radiculopathy, lumbar region: Secondary | ICD-10-CM | POA: Diagnosis not present

## 2023-03-31 DIAGNOSIS — M25611 Stiffness of right shoulder, not elsewhere classified: Secondary | ICD-10-CM | POA: Diagnosis not present

## 2023-04-05 DIAGNOSIS — E1165 Type 2 diabetes mellitus with hyperglycemia: Secondary | ICD-10-CM | POA: Diagnosis not present

## 2023-04-09 ENCOUNTER — Ambulatory Visit (HOSPITAL_COMMUNITY)
Admission: RE | Admit: 2023-04-09 | Discharge: 2023-04-09 | Disposition: A | Discharge status: Home / Self Care | Payer: Medicare Other | Source: Ambulatory Visit | Attending: Physical Medicine and Rehabilitation | Admitting: Physical Medicine and Rehabilitation

## 2023-04-09 DIAGNOSIS — M4319 Spondylolisthesis, multiple sites in spine: Secondary | ICD-10-CM | POA: Diagnosis not present

## 2023-04-09 DIAGNOSIS — M438X6 Other specified deforming dorsopathies, lumbar region: Secondary | ICD-10-CM | POA: Diagnosis not present

## 2023-04-09 DIAGNOSIS — M5416 Radiculopathy, lumbar region: Secondary | ICD-10-CM | POA: Insufficient documentation

## 2023-04-09 DIAGNOSIS — M545 Low back pain, unspecified: Secondary | ICD-10-CM | POA: Diagnosis not present

## 2023-04-09 DIAGNOSIS — M5136 Other intervertebral disc degeneration, lumbar region: Secondary | ICD-10-CM | POA: Diagnosis not present

## 2023-04-12 ENCOUNTER — Telehealth: Payer: Self-pay | Admitting: *Deleted

## 2023-04-12 DIAGNOSIS — M7712 Lateral epicondylitis, left elbow: Secondary | ICD-10-CM | POA: Diagnosis not present

## 2023-04-12 DIAGNOSIS — G894 Chronic pain syndrome: Secondary | ICD-10-CM | POA: Diagnosis not present

## 2023-04-12 DIAGNOSIS — G5602 Carpal tunnel syndrome, left upper limb: Secondary | ICD-10-CM | POA: Diagnosis not present

## 2023-04-12 DIAGNOSIS — M7551 Bursitis of right shoulder: Secondary | ICD-10-CM | POA: Diagnosis not present

## 2023-04-12 NOTE — Telephone Encounter (Signed)
Yes we can increase to 1 mg SQ weekly.  I think she gets this from the PAP so we will need to have Selena Batten update that for her next shipment.

## 2023-04-12 NOTE — Telephone Encounter (Signed)
Patient left a message. She states that she had been on Trulicity and Whitney changed it to the Ozempic and she has been on this for 2 months. SHe states that her blood sugars have been going up more so than normal. She feels that she needs to increase the Ozempic dose?

## 2023-04-13 NOTE — Telephone Encounter (Signed)
Patient was called and made aware. 

## 2023-04-13 NOTE — Telephone Encounter (Signed)
Will send refill order with changes

## 2023-04-26 ENCOUNTER — Other Ambulatory Visit: Payer: Self-pay | Admitting: Nurse Practitioner

## 2023-04-30 ENCOUNTER — Telehealth: Payer: Self-pay | Admitting: Nurse Practitioner

## 2023-04-30 NOTE — Telephone Encounter (Signed)
Called and let pt know that pt assistance of ozempic was here.

## 2023-04-30 NOTE — Telephone Encounter (Signed)
Pt picked up ozempic ?

## 2023-05-06 ENCOUNTER — Other Ambulatory Visit: Payer: Self-pay | Admitting: Nurse Practitioner

## 2023-05-12 ENCOUNTER — Other Ambulatory Visit: Payer: Self-pay

## 2023-05-12 MED ORDER — GLIPIZIDE ER 5 MG PO TB24: 5.0000 mg | ORAL_TABLET | Freq: Every day | ORAL | 0 refills | Status: DC

## 2023-05-13 ENCOUNTER — Ambulatory Visit: Payer: Medicare Other | Admitting: Nurse Practitioner

## 2023-05-13 ENCOUNTER — Encounter: Payer: Self-pay | Admitting: Nurse Practitioner

## 2023-05-13 VITALS — BP 120/68 | HR 72 | Ht 62.0 in | Wt 220.4 lb

## 2023-05-13 DIAGNOSIS — Z7984 Long term (current) use of oral hypoglycemic drugs: Secondary | ICD-10-CM | POA: Diagnosis not present

## 2023-05-13 DIAGNOSIS — Z794 Long term (current) use of insulin: Secondary | ICD-10-CM | POA: Diagnosis not present

## 2023-05-13 DIAGNOSIS — E1165 Type 2 diabetes mellitus with hyperglycemia: Secondary | ICD-10-CM | POA: Diagnosis not present

## 2023-05-13 DIAGNOSIS — E782 Mixed hyperlipidemia: Secondary | ICD-10-CM | POA: Diagnosis not present

## 2023-05-13 DIAGNOSIS — Z7985 Long-term (current) use of injectable non-insulin antidiabetic drugs: Secondary | ICD-10-CM | POA: Diagnosis not present

## 2023-05-13 LAB — POCT GLYCOSYLATED HEMOGLOBIN (HGB A1C): Hemoglobin A1C: 7.8 % — AB (ref 4.0–5.6)

## 2023-05-13 NOTE — Progress Notes (Signed)
05/13/2023, 1:32 PM  Endocrinology follow-up note   Subjective:    Patient ID: Erica Price, female    DOB: 06-Mar-1961.  Erica Price is being seen  in follow-up for management of currently uncontrolled symptomatic diabetes requested by  Assunta Found, MD.   Past Medical History:  Diagnosis Date   Apnea    Poss OSA   Arthralgia    Arthritis    Depression    Diabetes mellitus without complication (HCC)    Fatigue    Fibromyalgia    GERD (gastroesophageal reflux disease)    Hypertension    IBS (irritable bowel syndrome)    Myalgia    Pinched nerve    lumbar-l5-s1   PONV (postoperative nausea and vomiting)    Snoring     Past Surgical History:  Procedure Laterality Date   ARTHROSCOPIC REPAIR ACL Left    BALLOON DILATION N/A 06/05/2021   Procedure: BALLOON DILATION;  Surgeon: Lanelle Bal, DO;  [facility];  Service: Endoscopy;  Laterality: N/A;   BIOPSY N/A 06/12/2014   Procedure: BIOPSY;  Surgeon: West Bali, MD;  Location: [facility];  Service: Endoscopy;  Laterality: N/A;   BIOPSY  09/14/2017   Procedure: BIOPSY;  Surgeon: West Bali, MD;  [facility];  Service: Endoscopy;;  colon   BIOPSY  06/05/2021   Procedure: BIOPSY;  Surgeon: Lanelle Bal, DO;  [facility];  Service: Endoscopy;;   CARPAL TUNNEL RELEASE Right    CHOLECYSTECTOMY  1990s   CLUB FOOT RELEASE Left    x3   COLONOSCOPY WITH PROPOFOL N/A 06/12/2014   Dr. Jeralyn Bennett colon polyps removed/moderate divertiiculosis/small internal hemorrhoids/moderate sized external hemorrhoids. tubular adenomas. Next surveillance in Oct 2018.    COLONOSCOPY WITH PROPOFOL N/A 09/14/2017   Dr. Darrick Penna: simple adenomas, moderate diverticulosis in rectosigmoid colon, sigmoid colon, and descending colon. External and internal hemorrhoids, normal colon biopsies. simple adenomas. Needs surveillance  in 3 years with Propofol, colowrap   COLONOSCOPY WITH PROPOFOL N/A 06/05/2021   Procedure: COLONOSCOPY WITH PROPOFOL;  Surgeon: Lanelle Bal, DO;  [facility];  Service: Endoscopy;  Laterality: N/A;  7:30am   ESOPHAGOGASTRODUODENOSCOPY (EGD) WITH PROPOFOL N/A 06/12/2014   Dr. Cyndi Bender non-erosive gastritis, stricture at GE junction s/p savary dilation. benign gastric and duodenal biopsies   ESOPHAGOGASTRODUODENOSCOPY (EGD) WITH PROPOFOL N/A 06/05/2021   Procedure: ESOPHAGOGASTRODUODENOSCOPY (EGD) WITH PROPOFOL;  Surgeon: Lanelle Bal, DO;  [facility];  Service: Endoscopy;  Laterality: N/A;   POLYPECTOMY N/A 06/12/2014   Procedure: POLYPECTOMY;  Surgeon: West Bali, MD;  Location: [facility];  Service: Endoscopy;  Laterality: N/A;   POLYPECTOMY  09/14/2017   Procedure: POLYPECTOMY;  Surgeon: West Bali, MD;  [facility];  Service: Endoscopy;;  colon   POLYPECTOMY  06/05/2021   Procedure: POLYPECTOMY;  Surgeon: Lanelle Bal, DO;  [facility];  Service: Endoscopy;;   SAVORY DILATION N/A 06/12/2014   Procedure: SAVORY DILATION;  Surgeon: West Bali, MD;  Location: [facility];  Service: Endoscopy;  Laterality: N/A;  12.8-17    Social  History   Socioeconomic History   Marital status: Married    Spouse name: Erica Price   Number of children: 2   Years of education: Not on file   Highest education level: Associate degree: occupational, Scientist, product/process development, or vocational program  Occupational History   Occupation: disabled  Tobacco Use   Smoking status: Former    Current packs/day: 0.00    Average packs/day: 0.5 packs/day for 20.0 years (10.0 ttl pk-yrs)    Types: Cigarettes    Start date: 10/18/1991    Quit date: 10/18/2011    Years since quitting: 11.5   Smokeless tobacco: Never   Tobacco comments:    quit in 2014  Vaping Use   Vaping status: Never Used  Substance and Sexual Activity   Alcohol use: No   Drug use: No   Sexual  activity: Not Currently    Birth control/protection: Post-menopausal  Other Topics Concern   Not on file  Social History Narrative   She is currently unemployed.  She was terminated on 06/15/2013.  She was previously a Architect, worked at [employer], and worked in Designer, fashion/clothing for 25 years.   She lives with husband.  They have two children.   Social Determinants of Health   Financial Resource Strain: Low Risk  (11/11/2017)   Overall Financial Resource Strain (CARDIA)    Difficulty of Paying Living Expenses: Not hard at all  Food Insecurity: No Food Insecurity (11/11/2017)   Hunger Vital Sign    Worried About Running Out of Food in the Last Year: Never true    Ran Out of Food in the Last Year: Never true  Transportation Needs: No Transportation Needs (11/11/2017)   PRAPARE - Administrator, Civil Service (Medical): No    Lack of Transportation (Non-Medical): No  Physical Activity: Insufficiently Active (11/11/2017)   Exercise Vital Sign    Days of Exercise per Week: 1 day    Minutes of Exercise per Session: 20 min  Stress: No Stress Concern Present (11/11/2017)   [employer] of Occupational Health - Occupational Stress Questionnaire    Feeling of Stress : Not at all  Social Connections: Moderately Integrated (11/11/2017)   Social Connection and Isolation Panel [NHANES]    Frequency of Communication with Friends and Family: More than three times a week    Frequency of Social Gatherings with Friends and Family: Once a week    Attends Religious Services: 1 to 4 times per year    Active Member of [employer] or Organizations: No    Attends Banker Meetings: Never    Marital Status: Married    Family History  Problem Relation Age of Onset   Heart failure Father        Died, 74   Hypertension Mother        Living, 84   Breast cancer Mother    Kidney cancer Mother    Hypercholesterolemia Son    Breast cancer Sister    Pancreatic cancer Brother        passed  age 14   Hypertension Sister    Colon cancer Neg Hx     Outpatient Encounter Medications as of 05/13/2023  Medication Sig   albuterol (PROVENTIL HFA;VENTOLIN HFA) 108 (90 Base) MCG/ACT inhaler Inhale 2 puffs into the lungs every 6 (six) hours as needed for wheezing or shortness of breath.   Alpha-D-Galactosidase (BEANO PO) Take by mouth.   celecoxib (CELEBREX) 200 MG capsule Take 200 mg by mouth 2 (two) times  daily.   DULoxetine (CYMBALTA) 60 MG capsule Take 60 mg daily by mouth.    feeding supplement, GLUCERNA SHAKE, (GLUCERNA SHAKE) LIQD Take 237 mLs by mouth daily.   fluticasone (FLONASE) 50 MCG/ACT nasal spray Place 2 sprays into both nostrils daily as needed for allergies or rhinitis.   furosemide (LASIX) 20 MG tablet Take 20 mg by mouth daily as needed for fluid.   gabapentin (NEURONTIN) 300 MG capsule Take 600 mg by mouth 3 (three) times daily.   HYDROcodone-acetaminophen (NORCO) 7.5-325 MG tablet Take 1 tablet by mouth 2 (two) times daily as needed.   lidocaine (LIDODERM) 5 % 1 patch daily.   losartan (COZAAR) 25 MG tablet TAKE ONE TABLET BY MOUTH ONCE DAILY   modafinil (PROVIGIL) 100 MG tablet Take 200 mg by mouth daily.   Omega-3 Fatty Acids (OMEGA-3 FISH OIL PO) Take by mouth.   pantoprazole (PROTONIX) 40 MG tablet TAKE ONE TABLET BY MOUTH TWICE DAILY BEFORE A meal   rOPINIRole (REQUIP) 0.5 MG tablet Take 1 mg by mouth at bedtime.   rosuvastatin (CRESTOR) 10 MG tablet Take 1 tablet (10 mg total) by mouth daily.   simethicone (MYLICON) 125 MG chewable tablet Chew 125 mg by mouth every 6 (six) hours as needed for flatulence.   Suvorexant (BELSOMRA) 20 MG TABS Take 20 mg by mouth at bedtime.   TRESIBA FLEXTOUCH 100 UNIT/ML FlexTouch Pen INJECT 55 UNITS SUBCUTANEOUSLY AT BEDTIME   VITAMIN D-VITAMIN K PO Take by mouth.   zolpidem (AMBIEN) 10 MG tablet Take 10 mg by mouth at bedtime.   [DISCONTINUED] glipiZIDE (GLUCOTROL XL) 5 MG 24 hr tablet Take 1 tablet (5 mg total) by mouth daily  with breakfast.   [DISCONTINUED] glipiZIDE (GLUCOTROL XL) 5 MG 24 hr tablet TAKE 1 TABLET BY MOUTH ONCE DAILY WITH BREAKFAST   [DISCONTINUED] traMADol (ULTRAM) 50 MG tablet Take 100 mg by mouth every 6 (six) hours as needed for moderate pain. (Patient not taking: Reported on 02/10/2023)   No facility-administered encounter medications on file as of 05/13/2023.    ALLERGIES: No Known Allergies  VACCINATION STATUS:  There is no immunization history on file for this patient.  Diabetes She presents for her follow-up diabetic visit. She has type 2 diabetes mellitus. Onset time: She was diagnosed at approximate age of 52 years. Her disease course has been fluctuating. There are no hypoglycemic associated symptoms. Pertinent negatives for hypoglycemia include no confusion, headaches, pallor or seizures. Associated symptoms include fatigue. Pertinent negatives for diabetes include no chest pain, no polydipsia, no polyphagia, no polyuria and no weight loss. There are no hypoglycemic complications. Symptoms are stable. There are no diabetic complications. Risk factors for coronary artery disease include diabetes mellitus, obesity, sedentary lifestyle, post-menopausal, tobacco exposure, dyslipidemia and hypertension. Current diabetic treatment includes insulin injections and oral agent (monotherapy) (and Ozempic). She is compliant with treatment most of the time. Her weight is increasing steadily. She is following a generally healthy diet. When asked about meal planning, she reported none. She has not had a previous visit with a dietitian. She rarely participates in exercise. Her home blood glucose trend is fluctuating minimally. Her overall blood glucose range is 140-180 mg/dl. (She presents today with her CGM showing mostly at goal glycemic profile.  Her POCT A1c today is 7.8%, increasing slightly from last visit of 7.1%.  Analysis of her CGM shows TIR 64%, TAR 36%, TBR 0% with a GMI of 7.4%.  She denies any  hypoglycemia.  She has started going  to pain management specialist.) An ACE inhibitor/angiotensin II receptor blocker is not being taken. She does not see a podiatrist.Eye exam is current.  Hyperlipidemia This is a chronic problem. The current episode started more than 1 year ago. The problem is controlled. Recent lipid tests were reviewed and are normal. Exacerbating diseases include chronic renal disease, diabetes and obesity. Factors aggravating her hyperlipidemia include fatty foods. Pertinent negatives include no chest pain. Current antihyperlipidemic treatment includes statins. The current treatment provides significant improvement of lipids. Compliance problems include adherence to exercise and adherence to diet.  Risk factors for coronary artery disease include diabetes mellitus, dyslipidemia, obesity, a sedentary lifestyle, post-menopausal and hypertension.  Hypertension This is a chronic problem. The current episode started more than 1 year ago. The problem has been gradually improving since onset. The problem is controlled. Pertinent negatives include no chest pain or headaches. There are no associated agents to hypertension. Risk factors for coronary artery disease include diabetes mellitus, dyslipidemia, obesity, post-menopausal state and sedentary lifestyle. Past treatments include angiotensin blockers and diuretics. The current treatment provides moderate improvement. There are no compliance problems.  Hypertensive end-organ damage includes kidney disease. Identifiable causes of hypertension include chronic renal disease and sleep apnea.     Review of systems  Constitutional: + increasing body weight,  current Body mass index is 40.31 kg/m. , no fatigue, no subjective hyperthermia, no subjective hypothermia Eyes: no blurry vision, no xerophthalmia ENT: no sore throat, no nodules palpated in throat, no dysphagia/odynophagia, no hoarseness Cardiovascular: no chest pain, no shortness of  breath, no palpitations, no leg swelling Respiratory: no cough, no shortness of breath Gastrointestinal: no nausea/vomiting/diarrhea Musculoskeletal: + chronic diffuse muscle/joint aches (now seeing pain management specialist) Skin: no rashes, no hyperemia Neurological: no tremors, no numbness, no tingling, no dizziness Psychiatric: no depression, no anxiety    Objective:    BP 120/68 (BP Location: Left Arm, Patient Position: Sitting, Cuff Size: Large)   Pulse 72   Ht 5\' 2"  (1.575 m)   Wt 220 lb 6.4 oz (100 kg)   BMI 40.31 kg/m   Wt Readings from Last 3 Encounters:  05/13/23 220 lb 6.4 oz (100 kg)  02/10/23 212 lb 6.4 oz (96.3 kg)  10/12/22 212 lb 12.8 oz (96.5 kg)    BP Readings from Last 3 Encounters:  05/13/23 120/68  02/10/23 120/86  10/12/22 (!) 117/58     Physical Exam- Limited  Constitutional:  Body mass index is 40.31 kg/m. , not in acute distress, normal state of mind Eyes:  EOMI, no exophthalmos Neck: Supple Musculoskeletal: no gross deformities, strength intact in all four extremities, no gross restriction of joint movements Skin:  no rashes, no hyperemia Neurological: no tremor with outstretched hands   Diabetic Foot Exam - Simple   No data filed    CMP ( most recent) CMP     Component Value Date/Time   NA 135 05/27/2021 1345   K 4.6 05/27/2021 1345   CL 98 05/27/2021 1345   CO2 22 05/27/2021 1345   GLUCOSE 144 (H) 05/27/2021 1345   GLUCOSE 185 (H) 07/27/2017 0916   BUN 15 05/27/2021 1345   CREATININE 0.89 05/27/2021 1345   CALCIUM 9.2 05/27/2021 1345   PROT 7.2 05/27/2021 1345   ALBUMIN 4.3 05/27/2021 1345   AST 18 05/27/2021 1345   ALT 9 05/27/2021 1345   ALKPHOS 81 05/27/2021 1345   BILITOT 0.6 05/27/2021 1345   GFRNONAA 75 09/25/2020 0000   GFRAA 87 09/25/2020 0000  Lipid Panel     Component Value Date/Time   CHOL 110 09/25/2020 0000   CHOL 104 05/16/2020 1002   TRIG 101 10/27/2021 0000   HDL 47 09/25/2020 0000   HDL 47  05/16/2020 1002   LDLCALC 17 10/27/2021 0000   LDLCALC 37 05/16/2020 1002   LABVLDL 20 05/16/2020 1002      Assessment & Plan:   1) Controlled type 2 diabetes mellitus without complication  - AALIYA SCHENONE has currently uncontrolled symptomatic type 2 DM since 62 years of age.  She presents today with her CGM showing mostly at goal glycemic profile.  Her POCT A1c today is 7.8%, increasing slightly from last visit of 7.1%.  Analysis of her CGM shows TIR 64%, TAR 36%, TBR 0% with a GMI of 7.4%.  She denies any hypoglycemia.  She has started going to pain management specialist.  -her diabetes is complicated by obesity/sedentary life and she remains at a high risk for more acute and chronic complications which include CAD, CVA, CKD, retinopathy, and neuropathy. These are all discussed in detail with her.  - Nutritional counseling repeated at each appointment due to patients tendency to fall back in to old habits.  - The patient admits there is a room for improvement in their diet and drink choices. -  Suggestion is made for the patient to avoid simple carbohydrates from their diet including Cakes, Sweet Desserts / Pastries, Ice Cream, Soda (diet and regular), Sweet Tea, Candies, Chips, Cookies, Sweet Pastries, Store Bought Juices, Alcohol in Excess of 1-2 drinks a day, Artificial Sweeteners, Coffee Creamer, and "Sugar-free" Products. This will help patient to have stable blood glucose profile and potentially avoid unintended weight gain.   - I encouraged the patient to switch to unprocessed or minimally processed complex starch and increased protein intake (animal or plant source), fruits, and vegetables.   - Patient is advised to stick to a routine mealtimes to eat 3 meals a day and avoid unnecessary snacks (to snack only to correct hypoglycemia).  - I have approached her with the following individualized plan to manage diabetes and patient agrees:   -Based on her presenting glycemic  profile, she will not need prandial insulin for now.    - She is advised to continue Tresiba 55 units SQ nightly and Ozempic 1 mg SQ weekly (just recently started this higher dose).  I advised her to stop the Glipizide for now to see if it helps prevent her drastic fluctuations.  -She did not tolerate Metformin in the past, even in the ER form.    -She is encouraged to consistently monitor glucose twice daily (using her CGM), before breakfast and before bed and call the clinic if she has readings less than 70 or greater than 200 for 3 tests in a row.  She is benefiting from her CGM, is advised to continue using it.  - Patient specific target  A1c;  LDL, HDL, Triglycerides,  were discussed in detail.  2) Blood Pressure /Hypertension: Her blood pressure is controlled to target.  She is advised to continue Lasix 40 mg po daily prn for fluid and Losartan 25 mg po daily.  3) Lipids/Hyperlipidemia:   Her most recent lipid panel from 10/05/22 shows controlled LDL of 26.  She is advised to continue Crestor 10 mg po daily before bed.  Side effects and precautions discussed with her.  She is calling her PCP to schedule annual physical with labs soon.  4)  Weight/Diet:  Her Body  mass index is 40.31 kg/m.-   clearly complicating her diabetes care.  She is a candidate for modest weight loss.  I discussed with her the fact that loss of 5 - 10% of her  current body weight will have the most impact on her diabetes management.  CDE Consult will be initiated . Exercise, and detailed carbohydrates information provided  -  detailed on discharge instructions.  5) Chronic Care/Health Maintenance: -she on ARB and Statin medication and is encouraged to initiate and continue to follow up with Ophthalmology, Dentist, Podiatrist at least yearly or according to recommendations, and advised to stay away from smoking. I have recommended yearly flu vaccine and pneumonia vaccine at least every 5 years; moderate intensity  exercise for up to 150 minutes weekly; and  sleep for at least 7 hours a day.  - she is advised to maintain close follow up with Assunta Found, MD for primary care needs, as well as her other providers for optimal and coordinated care.      I spent  31  minutes in the care of the patient today including review of labs from CMP, Lipids, Thyroid Function, Hematology (current and previous including abstractions from other facilities); face-to-face time discussing  her blood glucose readings/logs, discussing hypoglycemia and hyperglycemia episodes and symptoms, medications doses, her options of short and long term treatment based on the latest standards of care / guidelines;  discussion about incorporating lifestyle medicine;  and documenting the encounter. Risk reduction counseling performed per USPSTF guidelines to reduce obesity and cardiovascular risk factors.     Please refer to Patient Instructions for Blood Glucose Monitoring and Insulin/Medications Dosing Guide"  in media tab for additional information. Please  also refer to " Patient Self Inventory" in the Media  tab for reviewed elements of pertinent patient history.  Adalberto Cole participated in the discussions, expressed understanding, and voiced agreement with the above plans.  All questions were answered to her satisfaction. she is encouraged to contact clinic should she have any questions or concerns prior to her return visit.    Follow up plan: - Return in about 4 months (around 09/12/2023) for Diabetes F/U with A1c in office, Bring meter and logs, No previsit labs.   Ronny Bacon, [facility] [address] [place], [place] [address] Phone: [phone] Fax: [phone]  05/13/2023, 1:32 PM

## 2023-06-02 ENCOUNTER — Telehealth: Payer: Self-pay | Admitting: Nurse Practitioner

## 2023-06-02 NOTE — Telephone Encounter (Signed)
Pt picked up pt assistance of tresiba and pen needles.

## 2023-06-02 NOTE — Telephone Encounter (Signed)
Called and let pt know that pt assistance of Tresiba and pen needles had arrived

## 2023-06-07 ENCOUNTER — Telehealth: Payer: Self-pay

## 2023-06-07 MED ORDER — PANTOPRAZOLE SODIUM 40 MG PO TBEC: DELAYED_RELEASE_TABLET | ORAL | 3 refills | Status: DC

## 2023-06-07 NOTE — Telephone Encounter (Signed)
Fax received from [facility] for refills on Pantoprazole Tab, pt was last seen on 09/23/22.

## 2023-06-07 NOTE — Addendum Note (Signed)
Addended by: Tiffany Kocher on: 06/07/2023 11:21 PM   Modules accepted: Orders

## 2023-06-09 DIAGNOSIS — M7551 Bursitis of right shoulder: Secondary | ICD-10-CM | POA: Diagnosis not present

## 2023-06-09 DIAGNOSIS — G894 Chronic pain syndrome: Secondary | ICD-10-CM | POA: Diagnosis not present

## 2023-06-09 DIAGNOSIS — G5602 Carpal tunnel syndrome, left upper limb: Secondary | ICD-10-CM | POA: Diagnosis not present

## 2023-06-09 DIAGNOSIS — M7712 Lateral epicondylitis, left elbow: Secondary | ICD-10-CM | POA: Diagnosis not present

## 2023-06-28 DIAGNOSIS — E1165 Type 2 diabetes mellitus with hyperglycemia: Secondary | ICD-10-CM | POA: Diagnosis not present

## 2023-06-29 ENCOUNTER — Telehealth: Payer: Self-pay | Admitting: Nurse Practitioner

## 2023-06-29 NOTE — Telephone Encounter (Signed)
Pt called with high BG readings.   Date Before breakfast Before lunch Before supper Bedtime  11/2 [phone] 205  11/3 172 223 262 221  11/4 264 213 263 239  11/5 150 301      Pt taking: Tresiba 55 units at bedtime       Ozempic 1mg   She is questioning if she can go back on the glipizide.

## 2023-06-30 MED ORDER — GLIPIZIDE ER 5 MG PO TB24: 5.0000 mg | ORAL_TABLET | Freq: Every day | ORAL | 0 refills | Status: DC

## 2023-06-30 NOTE — Telephone Encounter (Signed)
Yes have her restart it since her sugars are higher.  If she needs a refill we can send it in for her.

## 2023-06-30 NOTE — Telephone Encounter (Signed)
Pt notified and refill sent to pharmacy.

## 2023-07-05 ENCOUNTER — Other Ambulatory Visit (HOSPITAL_COMMUNITY): Payer: Self-pay | Admitting: Family Medicine

## 2023-07-05 ENCOUNTER — Telehealth: Payer: Self-pay | Admitting: Nurse Practitioner

## 2023-07-05 DIAGNOSIS — Z1231 Encounter for screening mammogram for malignant neoplasm of breast: Secondary | ICD-10-CM

## 2023-07-05 NOTE — Telephone Encounter (Signed)
Called pt to let her know that her pt assistance is here ready for pick up. If she calls back, let her know. Thanks

## 2023-07-06 NOTE — Telephone Encounter (Signed)
Pt picked up pt assistance of ozempic

## 2023-07-09 DIAGNOSIS — M797 Fibromyalgia: Secondary | ICD-10-CM | POA: Diagnosis not present

## 2023-07-09 DIAGNOSIS — E538 Deficiency of other specified B group vitamins: Secondary | ICD-10-CM | POA: Diagnosis not present

## 2023-07-09 DIAGNOSIS — Z23 Encounter for immunization: Secondary | ICD-10-CM | POA: Diagnosis not present

## 2023-07-09 DIAGNOSIS — K589 Irritable bowel syndrome without diarrhea: Secondary | ICD-10-CM | POA: Diagnosis not present

## 2023-07-09 DIAGNOSIS — I10 Essential (primary) hypertension: Secondary | ICD-10-CM | POA: Diagnosis not present

## 2023-07-09 DIAGNOSIS — E782 Mixed hyperlipidemia: Secondary | ICD-10-CM | POA: Diagnosis not present

## 2023-07-09 DIAGNOSIS — E559 Vitamin D deficiency, unspecified: Secondary | ICD-10-CM | POA: Diagnosis not present

## 2023-07-09 DIAGNOSIS — E1165 Type 2 diabetes mellitus with hyperglycemia: Secondary | ICD-10-CM | POA: Diagnosis not present

## 2023-07-09 DIAGNOSIS — D72829 Elevated white blood cell count, unspecified: Secondary | ICD-10-CM | POA: Diagnosis not present

## 2023-07-09 DIAGNOSIS — E1159 Type 2 diabetes mellitus with other circulatory complications: Secondary | ICD-10-CM | POA: Diagnosis not present

## 2023-07-09 DIAGNOSIS — G894 Chronic pain syndrome: Secondary | ICD-10-CM | POA: Diagnosis not present

## 2023-07-10 LAB — LAB REPORT - SCANNED: EGFR: 48

## 2023-07-15 ENCOUNTER — Ambulatory Visit (HOSPITAL_COMMUNITY)
Admission: RE | Admit: 2023-07-15 | Discharge: 2023-07-15 | Disposition: A | Discharge status: Home / Self Care | Payer: Medicare Other | Source: Ambulatory Visit | Attending: Family Medicine | Admitting: Family Medicine

## 2023-07-15 DIAGNOSIS — Z1231 Encounter for screening mammogram for malignant neoplasm of breast: Secondary | ICD-10-CM | POA: Diagnosis not present

## 2023-08-04 DIAGNOSIS — G5602 Carpal tunnel syndrome, left upper limb: Secondary | ICD-10-CM | POA: Diagnosis not present

## 2023-08-04 DIAGNOSIS — M7551 Bursitis of right shoulder: Secondary | ICD-10-CM | POA: Diagnosis not present

## 2023-08-04 DIAGNOSIS — G894 Chronic pain syndrome: Secondary | ICD-10-CM | POA: Diagnosis not present

## 2023-08-04 DIAGNOSIS — M7712 Lateral epicondylitis, left elbow: Secondary | ICD-10-CM | POA: Diagnosis not present

## 2023-08-09 ENCOUNTER — Encounter: Payer: Self-pay | Admitting: Gastroenterology

## 2023-08-26 DIAGNOSIS — R6889 Other general symptoms and signs: Secondary | ICD-10-CM | POA: Diagnosis not present

## 2023-08-26 DIAGNOSIS — A493 Mycoplasma infection, unspecified site: Secondary | ICD-10-CM | POA: Diagnosis not present

## 2023-08-30 ENCOUNTER — Other Ambulatory Visit: Payer: Self-pay | Admitting: "Endocrinology

## 2023-09-14 ENCOUNTER — Encounter: Payer: Self-pay | Admitting: Nurse Practitioner

## 2023-09-14 ENCOUNTER — Ambulatory Visit: Payer: Medicare Other | Admitting: Nurse Practitioner

## 2023-09-14 VITALS — BP 112/70 | HR 81 | Ht 62.0 in | Wt 221.4 lb

## 2023-09-14 DIAGNOSIS — E1165 Type 2 diabetes mellitus with hyperglycemia: Secondary | ICD-10-CM

## 2023-09-14 DIAGNOSIS — E782 Mixed hyperlipidemia: Secondary | ICD-10-CM | POA: Diagnosis not present

## 2023-09-14 DIAGNOSIS — Z7985 Long-term (current) use of injectable non-insulin antidiabetic drugs: Secondary | ICD-10-CM

## 2023-09-14 DIAGNOSIS — Z7984 Long term (current) use of oral hypoglycemic drugs: Secondary | ICD-10-CM | POA: Diagnosis not present

## 2023-09-14 DIAGNOSIS — Z794 Long term (current) use of insulin: Secondary | ICD-10-CM | POA: Diagnosis not present

## 2023-09-14 LAB — POCT GLYCOSYLATED HEMOGLOBIN (HGB A1C): Hemoglobin A1C: 8.1 % — AB (ref 4.0–5.6)

## 2023-09-14 MED ORDER — FREESTYLE LIBRE 3 PLUS SENSOR MISC: 3 refills | Status: DC

## 2023-09-14 NOTE — Progress Notes (Signed)
09/14/2023, 2:58 PM  Endocrinology follow-up note   Subjective:    Patient ID: Erica Price, female    DOB: Aug 21, 1961.  Erica Price is being seen  in follow-up for management of currently uncontrolled symptomatic diabetes requested by  Assunta Found, MD.   Past Medical History:  Diagnosis Date   Apnea    Poss OSA   Arthralgia    Arthritis    Depression    Diabetes mellitus without complication (HCC)    Fatigue    Fibromyalgia    GERD (gastroesophageal reflux disease)    Hypertension    IBS (irritable bowel syndrome)    Myalgia    Pinched nerve    [id number]-[id number]   PONV (postoperative nausea and vomiting)    Snoring     Past Surgical History:  Procedure Laterality Date   ARTHROSCOPIC REPAIR ACL Left    BALLOON DILATION N/A 06/05/2021   Procedure: BALLOON DILATION;  Surgeon: Lanelle Bal, DO;  [facility];  Service: Endoscopy;  Laterality: N/A;   BIOPSY N/A 06/12/2014   Procedure: BIOPSY;  Surgeon: West Bali, MD;  Location: [facility];  Service: Endoscopy;  Laterality: N/A;   BIOPSY  09/14/2017   Procedure: BIOPSY;  Surgeon: West Bali, MD;  [facility];  Service: Endoscopy;;  colon   BIOPSY  06/05/2021   Procedure: BIOPSY;  Surgeon: Lanelle Bal, DO;  [facility];  Service: Endoscopy;;   CARPAL TUNNEL RELEASE Right    CHOLECYSTECTOMY  1990s   CLUB FOOT RELEASE Left    x3   COLONOSCOPY WITH PROPOFOL N/A 06/12/2014   Dr. Jeralyn Bennett colon polyps removed/moderate divertiiculosis/small internal hemorrhoids/moderate sized external hemorrhoids. tubular adenomas. Next surveillance in Oct 2018.    COLONOSCOPY WITH PROPOFOL N/A 09/14/2017   Dr. Darrick Penna: simple adenomas, moderate diverticulosis in rectosigmoid colon, sigmoid colon, and descending colon. External and internal hemorrhoids, normal colon biopsies. simple adenomas. Needs surveillance  in 3 years with Propofol, colowrap   COLONOSCOPY WITH PROPOFOL N/A 06/05/2021   Procedure: COLONOSCOPY WITH PROPOFOL;  Surgeon: Lanelle Bal, DO;  [facility];  Service: Endoscopy;  Laterality: N/A;  7:30am   ESOPHAGOGASTRODUODENOSCOPY (EGD) WITH PROPOFOL N/A 06/12/2014   Dr. Cyndi Bender non-erosive gastritis, stricture at GE junction s/p savary dilation. benign gastric and duodenal biopsies   ESOPHAGOGASTRODUODENOSCOPY (EGD) WITH PROPOFOL N/A 06/05/2021   Procedure: ESOPHAGOGASTRODUODENOSCOPY (EGD) WITH PROPOFOL;  Surgeon: Lanelle Bal, DO;  [facility];  Service: Endoscopy;  Laterality: N/A;   POLYPECTOMY N/A 06/12/2014   Procedure: POLYPECTOMY;  Surgeon: West Bali, MD;  Location: [facility];  Service: Endoscopy;  Laterality: N/A;   POLYPECTOMY  09/14/2017   Procedure: POLYPECTOMY;  Surgeon: West Bali, MD;  [facility];  Service: Endoscopy;;  colon   POLYPECTOMY  06/05/2021   Procedure: POLYPECTOMY;  Surgeon: Lanelle Bal, DO;  [facility];  Service: Endoscopy;;   SAVORY DILATION N/A 06/12/2014   Procedure: SAVORY DILATION;  Surgeon: West Bali, MD;  Location: [facility];  Service: Endoscopy;  Laterality: N/A;  12.8-17    Social  History   Socioeconomic History   Marital status: Married    Spouse name: Maisie Fus   Number of children: 2   Years of education: Not on file   Highest education level: Associate degree: occupational, Scientist, product/process development, or vocational program  Occupational History   Occupation: disabled  Tobacco Use   Smoking status: Former    Current packs/day: 0.00    Average packs/day: 0.5 packs/day for 20.0 years (10.0 ttl pk-yrs)    Types: Cigarettes    Start date: 10/18/1991    Quit date: 10/18/2011    Years since quitting: 11.9   Smokeless tobacco: Never   Tobacco comments:    quit in 2014  Vaping Use   Vaping status: Never Used  Substance and Sexual Activity   Alcohol use: No   Drug use: No   Sexual  activity: Not Currently    Birth control/protection: Post-menopausal  Other Topics Concern   Not on file  Social History Narrative   She is currently unemployed.  She was terminated on 06/15/2013.  She was previously a Architect, worked at [employer], and worked in Designer, fashion/clothing for 25 years.   She lives with husband.  They have two children.   Social Drivers of Corporate investment banker Strain: Low Risk  (11/11/2017)   Overall Financial Resource Strain (CARDIA)    Difficulty of Paying Living Expenses: Not hard at all  Food Insecurity: No Food Insecurity (11/11/2017)   Hunger Vital Sign    Worried About Running Out of Food in the Last Year: Never true    Ran Out of Food in the Last Year: Never true  Transportation Needs: No Transportation Needs (11/11/2017)   PRAPARE - Administrator, Civil Service (Medical): No    Lack of Transportation (Non-Medical): No  Physical Activity: Insufficiently Active (11/11/2017)   Exercise Vital Sign    Days of Exercise per Week: 1 day    Minutes of Exercise per Session: 20 min  Stress: No Stress Concern Present (11/11/2017)   [employer] of Occupational Health - Occupational Stress Questionnaire    Feeling of Stress : Not at all  Social Connections: Moderately Integrated (11/11/2017)   Social Connection and Isolation Panel [NHANES]    Frequency of Communication with Friends and Family: More than three times a week    Frequency of Social Gatherings with Friends and Family: Once a week    Attends Religious Services: 1 to 4 times per year    Active Member of [employer] or Organizations: No    Attends Banker Meetings: Never    Marital Status: Married    Family History  Problem Relation Age of Onset   Heart failure Father        Died, 21   Hypertension Mother        Living, 1   Breast cancer Mother    Kidney cancer Mother    Hypercholesterolemia Son    Breast cancer Sister    Pancreatic cancer Brother        passed age  35   Hypertension Sister    Colon cancer Neg Hx     Outpatient Encounter Medications as of 09/14/2023  Medication Sig   albuterol (PROVENTIL HFA;VENTOLIN HFA) 108 (90 Base) MCG/ACT inhaler Inhale 2 puffs into the lungs every 6 (six) hours as needed for wheezing or shortness of breath.   Alpha-D-Galactosidase (BEANO PO) Take by mouth.   benzonatate (TESSALON) 200 MG capsule Take 200 mg by mouth 3 (three) times  daily.   celecoxib (CELEBREX) 200 MG capsule Take 200 mg by mouth 2 (two) times daily.   chlorpheniramine-HYDROcodone (TUSSIONEX) 10-8 MG/5ML Take 5 mLs by mouth as needed.   Continuous Glucose Sensor (FREESTYLE LIBRE 3 PLUS SENSOR) MISC Change sensor every 15 days.   DULoxetine (CYMBALTA) 60 MG capsule Take 60 mg daily by mouth.    fluticasone (FLONASE) 50 MCG/ACT nasal spray Place 2 sprays into both nostrils daily as needed for allergies or rhinitis.   furosemide (LASIX) 20 MG tablet Take 20 mg by mouth daily as needed for fluid.   gabapentin (NEURONTIN) 300 MG capsule Take 600 mg by mouth 3 (three) times daily.   glipiZIDE (GLUCOTROL XL) 5 MG 24 hr tablet TAKE 1 TABLET BY MOUTH DAILY  WITH BREAKFAST   HYDROcodone-acetaminophen (NORCO) 7.5-325 MG tablet Take 1 tablet by mouth 2 (two) times daily as needed.   lidocaine (LIDODERM) 5 % 1 patch daily.   losartan (COZAAR) 25 MG tablet TAKE ONE TABLET BY MOUTH ONCE DAILY   Omega-3 Fatty Acids (OMEGA-3 FISH OIL PO) Take by mouth.   pantoprazole (PROTONIX) 40 MG tablet TAKE ONE TABLET BY MOUTH ONCE TO TWICE DAILY BEFORE A meal   rOPINIRole (REQUIP) 0.5 MG tablet Take 1 mg by mouth at bedtime.   rosuvastatin (CRESTOR) 10 MG tablet Take 1 tablet (10 mg total) by mouth daily.   simethicone (MYLICON) 125 MG chewable tablet Chew 125 mg by mouth every 6 (six) hours as needed for flatulence.   tiZANidine (ZANAFLEX) 2 MG tablet Take 2 mg by mouth daily as needed.   TRESIBA FLEXTOUCH 100 UNIT/ML FlexTouch Pen INJECT 55 UNITS SUBCUTANEOUSLY AT BEDTIME    VITAMIN D-VITAMIN K PO Take by mouth.   zolpidem (AMBIEN) 10 MG tablet Take 10 mg by mouth at bedtime.   feeding supplement, GLUCERNA SHAKE, (GLUCERNA SHAKE) LIQD Take 237 mLs by mouth daily. (Patient not taking: Reported on 09/14/2023)   modafinil (PROVIGIL) 100 MG tablet Take 200 mg by mouth daily. (Patient not taking: Reported on 09/14/2023)   Suvorexant (BELSOMRA) 20 MG TABS Take 20 mg by mouth at bedtime. (Patient not taking: Reported on 09/14/2023)   No facility-administered encounter medications on file as of 09/14/2023.    ALLERGIES: No Known Allergies  VACCINATION STATUS:  There is no immunization history on file for this patient.  Diabetes She presents for her follow-up diabetic visit. She has type 2 diabetes mellitus. Onset time: She was diagnosed at approximate age of 52 years. Her disease course has been worsening. There are no hypoglycemic associated symptoms. Pertinent negatives for hypoglycemia include no confusion, headaches, pallor or seizures. Associated symptoms include fatigue. Pertinent negatives for diabetes include no chest pain, no polydipsia, no polyphagia, no polyuria and no weight loss. There are no hypoglycemic complications. Symptoms are stable. There are no diabetic complications. Risk factors for coronary artery disease include diabetes mellitus, obesity, sedentary lifestyle, post-menopausal, tobacco exposure, dyslipidemia and hypertension. Current diabetic treatment includes insulin injections and oral agent (monotherapy) (and Ozempic). She is compliant with treatment most of the time. Her weight is increasing steadily. She is following a generally healthy diet. When asked about meal planning, she reported none. She has not had a previous visit with a dietitian. She rarely participates in exercise. Her home blood glucose trend is increasing steadily. Her overall blood glucose range is >200 mg/dl. (She presents today with her CGM showing above target glycemic profile  overall.  Her POCT A1c today is 8.1%, increasing slightly from last visit  of 7.8%.  Analysis of her CGM shows TIR 18%, TAR 82%, TBR 0% with a GMI of 8.4%.  She denies any hypoglycemia.  She was sick between visits and had steroid shot plus antibiotics.) An ACE inhibitor/angiotensin II receptor blocker is not being taken. She does not see a podiatrist.Eye exam is current.  Hyperlipidemia This is a chronic problem. The current episode started more than 1 year ago. The problem is controlled. Recent lipid tests were reviewed and are normal. Exacerbating diseases include chronic renal disease, diabetes and obesity. Factors aggravating her hyperlipidemia include fatty foods. Pertinent negatives include no chest pain. Current antihyperlipidemic treatment includes statins. The current treatment provides significant improvement of lipids. Compliance problems include adherence to exercise and adherence to diet.  Risk factors for coronary artery disease include diabetes mellitus, dyslipidemia, obesity, a sedentary lifestyle, post-menopausal and hypertension.  Hypertension This is a chronic problem. The current episode started more than 1 year ago. The problem has been gradually improving since onset. The problem is controlled. Pertinent negatives include no chest pain or headaches. There are no associated agents to hypertension. Risk factors for coronary artery disease include diabetes mellitus, dyslipidemia, obesity, post-menopausal state and sedentary lifestyle. Past treatments include angiotensin blockers and diuretics. The current treatment provides moderate improvement. There are no compliance problems.  Hypertensive end-organ damage includes kidney disease. Identifiable causes of hypertension include chronic renal disease and sleep apnea.     Review of systems  Constitutional: + increasing body weight,  current Body mass index is 40.49 kg/m. , no fatigue, no subjective hyperthermia, no subjective  hypothermia Eyes: no blurry vision, no xerophthalmia ENT: no sore throat, no nodules palpated in throat, no dysphagia/odynophagia, no hoarseness Cardiovascular: no chest pain, no shortness of breath, no palpitations, no leg swelling Respiratory: no cough, no shortness of breath Gastrointestinal: no nausea/vomiting/diarrhea Musculoskeletal: + chronic diffuse muscle/joint aches (now seeing pain management specialist) Skin: no rashes, no hyperemia Neurological: no tremors, no numbness, no tingling, no dizziness Psychiatric: no depression, no anxiety    Objective:    BP 112/70 (BP Location: Left Arm, Patient Position: Sitting, Cuff Size: Large)   Pulse 81   Ht 5\' 2"  (1.575 m)   Wt 221 lb 6.4 oz (100.4 kg)   BMI 40.49 kg/m   Wt Readings from Last 3 Encounters:  09/14/23 221 lb 6.4 oz (100.4 kg)  05/13/23 220 lb 6.4 oz (100 kg)  02/10/23 212 lb 6.4 oz (96.3 kg)    BP Readings from Last 3 Encounters:  09/14/23 112/70  05/13/23 120/68  02/10/23 120/86      Physical Exam- Limited  Constitutional:  Body mass index is 40.49 kg/m. , not in acute distress, normal state of mind Eyes:  EOMI, no exophthalmos Musculoskeletal: no gross deformities, strength intact in all four extremities, no gross restriction of joint movements Skin:  no rashes, no hyperemia Neurological: no tremor with outstretched hands   Diabetic Foot Exam - Simple   No data filed    CMP ( most recent) CMP     Component Value Date/Time   NA 135 05/27/2021 1345   K 4.6 05/27/2021 1345   CL 98 05/27/2021 1345   CO2 22 05/27/2021 1345   GLUCOSE 144 (H) 05/27/2021 1345   GLUCOSE 185 (H) 07/27/2017 0916   BUN 15 05/27/2021 1345   CREATININE 0.89 05/27/2021 1345   CALCIUM 9.2 05/27/2021 1345   PROT 7.2 05/27/2021 1345   ALBUMIN 4.3 05/27/2021 1345   AST 18 05/27/2021 1345  ALT 9 05/27/2021 1345   ALKPHOS 81 05/27/2021 1345   BILITOT 0.6 05/27/2021 1345   GFRNONAA 75 09/25/2020 0000   GFRAA 87  09/25/2020 0000    Lipid Panel     Component Value Date/Time   CHOL 110 09/25/2020 0000   CHOL 104 05/16/2020 1002   TRIG 101 10/27/2021 0000   HDL 47 09/25/2020 0000   HDL 47 05/16/2020 1002   LDLCALC 17 10/27/2021 0000   LDLCALC 37 05/16/2020 1002   LABVLDL 20 05/16/2020 1002      Assessment & Plan:   1) Controlled type 2 diabetes mellitus without complication  - Erica Price has currently uncontrolled symptomatic type 2 DM since 63 years of age.  She presents today with her CGM showing above target glycemic profile overall.  Her POCT A1c today is 8.1%, increasing slightly from last visit of 7.8%.  Analysis of her CGM shows TIR 18%, TAR 82%, TBR 0% with a GMI of 8.4%.  She denies any hypoglycemia.  She was sick between visits and had steroid shot plus antibiotics.  -her diabetes is complicated by obesity/sedentary life and she remains at a high risk for more acute and chronic complications which include CAD, CVA, CKD, retinopathy, and neuropathy. These are all discussed in detail with her.  - Nutritional counseling repeated at each appointment due to patients tendency to fall back in to old habits.  - The patient admits there is a room for improvement in their diet and drink choices. -  Suggestion is made for the patient to avoid simple carbohydrates from their diet including Cakes, Sweet Desserts / Pastries, Ice Cream, Soda (diet and regular), Sweet Tea, Candies, Chips, Cookies, Sweet Pastries, Store Bought Juices, Alcohol in Excess of 1-2 drinks a day, Artificial Sweeteners, Coffee Creamer, and "Sugar-free" Products. This will help patient to have stable blood glucose profile and potentially avoid unintended weight gain.   - I encouraged the patient to switch to unprocessed or minimally processed complex starch and increased protein intake (animal or plant source), fruits, and vegetables.   - Patient is advised to stick to a routine mealtimes to eat 3 meals a day and avoid  unnecessary snacks (to snack only to correct hypoglycemia).  - I have approached her with the following individualized plan to manage diabetes and patient agrees:   -Based on her presenting glycemic profile, she will not need prandial insulin for now.    - She is advised to increase Tresiba to 65 units SQ nightly and increase her Ozempic  to 2 mg SQ weekly (to finish her supply of 1 mg injections first). She can continue the Glipizide 5 mg XL daily at breakfast for now, may need to d/c if she experiences hypoglycemia.  She gets her medications from PAP.  -She did not tolerate Metformin in the past, even in the ER form.    -She is encouraged to consistently monitor glucose twice daily (using her CGM), before breakfast and before bed and call the clinic if she has readings less than 70 or greater than 200 for 3 tests in a row.  She is benefiting from her CGM, is advised to continue using it.  She notes her CGM receiver is giving her trouble, wants to upgrade.  Her phone is compatible with Libre 3.  Will send in for [facility] 3 plus sensors to [facility].  - Patient specific target  A1c;  LDL, HDL, Triglycerides,  were discussed in detail.  2) Blood Pressure /Hypertension: Her  blood pressure is controlled to target.  She is advised to continue Lasix 40 mg po daily prn for fluid and Losartan 25 mg po daily.  3) Lipids/Hyperlipidemia:   Her most recent lipid panel from 10/05/22 shows controlled LDL of 26.  She is advised to continue Crestor 10 mg po daily before bed.  Side effects and precautions discussed with her.  She is calling her PCP to schedule annual physical with labs soon.  4)  Weight/Diet:  Her Body mass index is 40.49 kg/m.-   clearly complicating her diabetes care.  She is a candidate for modest weight loss.  I discussed with her the fact that loss of 5 - 10% of her  current body weight will have the most impact on her diabetes management.  CDE Consult will be initiated . Exercise, and  detailed carbohydrates information provided  -  detailed on discharge instructions.  5) Chronic Care/Health Maintenance: -she on ARB and Statin medication and is encouraged to initiate and continue to follow up with Ophthalmology, Dentist, Podiatrist at least yearly or according to recommendations, and advised to stay away from smoking. I have recommended yearly flu vaccine and pneumonia vaccine at least every 5 years; moderate intensity exercise for up to 150 minutes weekly; and  sleep for at least 7 hours a day.  - she is advised to maintain close follow up with Assunta Found, MD for primary care needs, as well as her other providers for optimal and coordinated care.     I spent  46  minutes in the care of the patient today including review of labs from CMP, Lipids, Thyroid Function, Hematology (current and previous including abstractions from other facilities); face-to-face time discussing  her blood glucose readings/logs, discussing hypoglycemia and hyperglycemia episodes and symptoms, medications doses, her options of short and long term treatment based on the latest standards of care / guidelines;  discussion about incorporating lifestyle medicine;  and documenting the encounter. Risk reduction counseling performed per USPSTF guidelines to reduce obesity and cardiovascular risk factors.     Please refer to Patient Instructions for Blood Glucose Monitoring and Insulin/Medications Dosing Guide"  in media tab for additional information. Please  also refer to " Patient Self Inventory" in the Media  tab for reviewed elements of pertinent patient history.  Adalberto Cole participated in the discussions, expressed understanding, and voiced agreement with the above plans.  All questions were answered to her satisfaction. she is encouraged to contact clinic should she have any questions or concerns prior to her return visit.    Follow up plan: - Return in about 4 months (around 01/12/2024) for  Diabetes F/U with A1c in office, No previsit labs, Bring meter and logs.   Ronny Bacon, [facility] [address] [place], [place] [address] Phone: [phone] Fax: [phone]  09/14/2023, 2:58 PM

## 2023-09-20 ENCOUNTER — Other Ambulatory Visit: Payer: Self-pay | Admitting: Nurse Practitioner

## 2023-09-26 DIAGNOSIS — E119 Type 2 diabetes mellitus without complications: Secondary | ICD-10-CM | POA: Diagnosis not present

## 2023-09-28 ENCOUNTER — Other Ambulatory Visit: Payer: Self-pay

## 2023-09-28 MED ORDER — FREESTYLE LIBRE 3 PLUS SENSOR MISC: 3 refills | Status: DC

## 2023-09-29 DIAGNOSIS — M7551 Bursitis of right shoulder: Secondary | ICD-10-CM | POA: Diagnosis not present

## 2023-09-29 DIAGNOSIS — M7712 Lateral epicondylitis, left elbow: Secondary | ICD-10-CM | POA: Diagnosis not present

## 2023-09-29 DIAGNOSIS — G894 Chronic pain syndrome: Secondary | ICD-10-CM | POA: Diagnosis not present

## 2023-09-29 DIAGNOSIS — G5602 Carpal tunnel syndrome, left upper limb: Secondary | ICD-10-CM | POA: Diagnosis not present

## 2023-10-01 ENCOUNTER — Telehealth: Payer: Self-pay | Admitting: Nurse Practitioner

## 2023-10-01 NOTE — Telephone Encounter (Signed)
 Pt picked up pt assistance ozempic, tresiba , and pen needles

## 2023-10-01 NOTE — Telephone Encounter (Signed)
 Pt has pt assistance Ozempic ready for pick up. Pt notified via phone.

## 2023-10-26 ENCOUNTER — Ambulatory Visit (HOSPITAL_COMMUNITY)
Admission: RE | Admit: 2023-10-26 | Discharge: 2023-10-26 | Disposition: A | Discharge status: Home / Self Care | Source: Ambulatory Visit | Attending: Family Medicine | Admitting: Family Medicine

## 2023-10-26 ENCOUNTER — Other Ambulatory Visit (HOSPITAL_COMMUNITY): Payer: Self-pay | Admitting: Family Medicine

## 2023-10-26 DIAGNOSIS — R053 Chronic cough: Secondary | ICD-10-CM

## 2023-10-26 DIAGNOSIS — Z0001 Encounter for general adult medical examination with abnormal findings: Secondary | ICD-10-CM | POA: Diagnosis not present

## 2023-10-26 DIAGNOSIS — G894 Chronic pain syndrome: Secondary | ICD-10-CM | POA: Diagnosis not present

## 2023-10-26 DIAGNOSIS — E538 Deficiency of other specified B group vitamins: Secondary | ICD-10-CM | POA: Diagnosis not present

## 2023-10-26 DIAGNOSIS — M797 Fibromyalgia: Secondary | ICD-10-CM | POA: Diagnosis not present

## 2023-10-26 DIAGNOSIS — I10 Essential (primary) hypertension: Secondary | ICD-10-CM | POA: Diagnosis not present

## 2023-10-26 DIAGNOSIS — K589 Irritable bowel syndrome without diarrhea: Secondary | ICD-10-CM | POA: Diagnosis not present

## 2023-10-26 DIAGNOSIS — E1159 Type 2 diabetes mellitus with other circulatory complications: Secondary | ICD-10-CM | POA: Diagnosis not present

## 2023-11-01 ENCOUNTER — Other Ambulatory Visit: Payer: Self-pay | Admitting: Nurse Practitioner

## 2023-11-03 LAB — HM DIABETES EYE EXAM

## 2023-11-17 ENCOUNTER — Telehealth: Payer: Self-pay | Admitting: Nurse Practitioner

## 2023-11-17 NOTE — Telephone Encounter (Signed)
 Pt called and made aware that her PAP is here for pick up

## 2023-11-17 NOTE — Telephone Encounter (Signed)
Pt picked up patient assistance. 

## 2023-11-24 DIAGNOSIS — M7551 Bursitis of right shoulder: Secondary | ICD-10-CM | POA: Diagnosis not present

## 2023-11-24 DIAGNOSIS — M7712 Lateral epicondylitis, left elbow: Secondary | ICD-10-CM | POA: Diagnosis not present

## 2023-11-24 DIAGNOSIS — G894 Chronic pain syndrome: Secondary | ICD-10-CM | POA: Diagnosis not present

## 2023-11-24 DIAGNOSIS — G5602 Carpal tunnel syndrome, left upper limb: Secondary | ICD-10-CM | POA: Diagnosis not present

## 2023-12-27 DIAGNOSIS — M797 Fibromyalgia: Secondary | ICD-10-CM | POA: Diagnosis not present

## 2023-12-27 DIAGNOSIS — I10 Essential (primary) hypertension: Secondary | ICD-10-CM | POA: Diagnosis not present

## 2023-12-27 DIAGNOSIS — G894 Chronic pain syndrome: Secondary | ICD-10-CM | POA: Diagnosis not present

## 2023-12-27 DIAGNOSIS — E1159 Type 2 diabetes mellitus with other circulatory complications: Secondary | ICD-10-CM | POA: Diagnosis not present

## 2023-12-27 DIAGNOSIS — E559 Vitamin D deficiency, unspecified: Secondary | ICD-10-CM | POA: Diagnosis not present

## 2023-12-27 DIAGNOSIS — E538 Deficiency of other specified B group vitamins: Secondary | ICD-10-CM | POA: Diagnosis not present

## 2023-12-31 ENCOUNTER — Telehealth: Payer: Self-pay | Admitting: Nurse Practitioner

## 2023-12-31 NOTE — Telephone Encounter (Signed)
 Pt notified that her PAP is here. She will p/u monday

## 2024-01-06 NOTE — Telephone Encounter (Signed)
 Pt picked up pt assistance.

## 2024-01-12 ENCOUNTER — Encounter: Payer: Self-pay | Admitting: Nurse Practitioner

## 2024-01-12 ENCOUNTER — Ambulatory Visit: Payer: Medicare Other | Admitting: Nurse Practitioner

## 2024-01-12 VITALS — BP 102/80 | HR 81 | Ht 62.0 in | Wt 223.0 lb

## 2024-01-12 DIAGNOSIS — E1165 Type 2 diabetes mellitus with hyperglycemia: Secondary | ICD-10-CM | POA: Diagnosis not present

## 2024-01-12 DIAGNOSIS — Z794 Long term (current) use of insulin: Secondary | ICD-10-CM | POA: Diagnosis not present

## 2024-01-12 DIAGNOSIS — Z7985 Long-term (current) use of injectable non-insulin antidiabetic drugs: Secondary | ICD-10-CM

## 2024-01-12 DIAGNOSIS — Z7984 Long term (current) use of oral hypoglycemic drugs: Secondary | ICD-10-CM | POA: Diagnosis not present

## 2024-01-12 DIAGNOSIS — E782 Mixed hyperlipidemia: Secondary | ICD-10-CM

## 2024-01-12 LAB — POCT GLYCOSYLATED HEMOGLOBIN (HGB A1C): Hemoglobin A1C: 7.3 % — AB (ref 4.0–5.6)

## 2024-01-12 NOTE — Progress Notes (Signed)
 01/12/2024, 2:02 PM  Endocrinology follow-up note   Subjective:    Patient ID: Erica Price, female    DOB: 1960/10/04.  Erica Price is being seen  in follow-up for management of currently uncontrolled symptomatic diabetes requested by  Minus Amel, MD.   Past Medical History:  Diagnosis Date   Apnea    Poss OSA   Arthralgia    Arthritis    Depression    Diabetes mellitus without complication (HCC)    Fatigue    Fibromyalgia    GERD (gastroesophageal reflux disease)    Hypertension    IBS (irritable bowel syndrome)    Myalgia    Pinched nerve    lumbar-l5-s1   PONV (postoperative nausea and vomiting)    Snoring     Past Surgical History:  Procedure Laterality Date   ARTHROSCOPIC REPAIR ACL Left    BALLOON DILATION N/A 06/05/2021   Procedure: BALLOON DILATION;  Surgeon: Vinetta Greening, DO;  [facility];  Service: Endoscopy;  Laterality: N/A;   BIOPSY N/A 06/12/2014   Procedure: BIOPSY;  Surgeon: Alyce Jubilee, MD;  Location: [facility];  Service: Endoscopy;  Laterality: N/A;   BIOPSY  09/14/2017   Procedure: BIOPSY;  Surgeon: Alyce Jubilee, MD;  [facility];  Service: Endoscopy;;  colon   BIOPSY  06/05/2021   Procedure: BIOPSY;  Surgeon: Vinetta Greening, DO;  [facility];  Service: Endoscopy;;   CARPAL TUNNEL RELEASE Right    CHOLECYSTECTOMY  1990s   CLUB FOOT RELEASE Left    x3   COLONOSCOPY WITH PROPOFOL  N/A 06/12/2014   Dr. Jonna Netter colon polyps removed/moderate divertiiculosis/small internal hemorrhoids/moderate sized external hemorrhoids. tubular adenomas. Next surveillance in Oct 2018.    COLONOSCOPY WITH PROPOFOL  N/A 09/14/2017   Dr. Nolene Baumgarten: simple adenomas, moderate diverticulosis in rectosigmoid colon, sigmoid colon, and descending colon. External and internal hemorrhoids, normal colon biopsies. simple adenomas. Needs surveillance  in 3 years with Propofol , colowrap   COLONOSCOPY WITH PROPOFOL  N/A 06/05/2021   Procedure: COLONOSCOPY WITH PROPOFOL ;  Surgeon: Vinetta Greening, DO;  [facility];  Service: Endoscopy;  Laterality: N/A;  7:30am   ESOPHAGOGASTRODUODENOSCOPY (EGD) WITH PROPOFOL  N/A 06/12/2014   Dr. Beverley Buck non-erosive gastritis, stricture at GE junction s/p savary dilation. benign gastric and duodenal biopsies   ESOPHAGOGASTRODUODENOSCOPY (EGD) WITH PROPOFOL  N/A 06/05/2021   Procedure: ESOPHAGOGASTRODUODENOSCOPY (EGD) WITH PROPOFOL ;  Surgeon: Vinetta Greening, DO;  [facility];  Service: Endoscopy;  Laterality: N/A;   POLYPECTOMY N/A 06/12/2014   Procedure: POLYPECTOMY;  Surgeon: Alyce Jubilee, MD;  Location: [facility];  Service: Endoscopy;  Laterality: N/A;   POLYPECTOMY  09/14/2017   Procedure: POLYPECTOMY;  Surgeon: Alyce Jubilee, MD;  [facility];  Service: Endoscopy;;  colon   POLYPECTOMY  06/05/2021   Procedure: POLYPECTOMY;  Surgeon: Vinetta Greening, DO;  [facility];  Service: Endoscopy;;   SAVORY DILATION N/A 06/12/2014   Procedure: SAVORY DILATION;  Surgeon: Alyce Jubilee, MD;  Location: [facility];  Service: Endoscopy;  Laterality: N/A;  12.8-17    Social  History   Socioeconomic History   Marital status: Married    Spouse name: Erica Price   Number of children: 2   Years of education: Not on file   Highest education level: Associate degree: occupational, Scientist, product/process development, or vocational program  Occupational History   Occupation: disabled  Tobacco Use   Smoking status: Former    Current packs/day: 0.00    Average packs/day: 0.5 packs/day for 20.0 years (10.0 ttl pk-yrs)    Types: Cigarettes    Start date: 10/18/1991    Quit date: 10/18/2011    Years since quitting: 12.2   Smokeless tobacco: Never   Tobacco comments:    quit in 2014  Vaping Use   Vaping status: Never Used  Substance and Sexual Activity   Alcohol use: No   Drug use: No   Sexual  activity: Not Currently    Birth control/protection: Post-menopausal  Other Topics Concern   Not on file  Social History Narrative   She is currently unemployed.  She was terminated on 06/15/2013.  She was previously a Architect, worked at [employer], and worked in Designer, fashion/clothing for 25 years.   She lives with husband.  They have two children.   Social Drivers of Corporate investment banker Strain: Low Risk  (11/11/2017)   Overall Financial Resource Strain (CARDIA)    Difficulty of Paying Living Expenses: Not hard at all  Food Insecurity: No Food Insecurity (11/11/2017)   Hunger Vital Sign    Worried About Running Out of Food in the Last Year: Never true    Ran Out of Food in the Last Year: Never true  Transportation Needs: No Transportation Needs (11/11/2017)   PRAPARE - Administrator, Civil Service (Medical): No    Lack of Transportation (Non-Medical): No  Physical Activity: Insufficiently Active (11/11/2017)   Exercise Vital Sign    Days of Exercise per Week: 1 day    Minutes of Exercise per Session: 20 min  Stress: No Stress Concern Present (11/11/2017)   [employer] of Occupational Health - Occupational Stress Questionnaire    Feeling of Stress : Not at all  Social Connections: Moderately Integrated (11/11/2017)   Social Connection and Isolation Panel [NHANES]    Frequency of Communication with Friends and Family: More than three times a week    Frequency of Social Gatherings with Friends and Family: Once a week    Attends Religious Services: 1 to 4 times per year    Active Member of [employer] or Organizations: No    Attends Banker Meetings: Never    Marital Status: Married    Family History  Problem Relation Age of Onset   Heart failure Father        Died, 72   Hypertension Mother        Living, 58   Breast cancer Mother    Kidney cancer Mother    Hypercholesterolemia Son    Breast cancer Sister    Pancreatic cancer Brother        passed age  56   Hypertension Sister    Colon cancer Neg Hx     Outpatient Encounter Medications as of 01/12/2024  Medication Sig   albuterol (PROVENTIL HFA;VENTOLIN HFA) 108 (90 Base) MCG/ACT inhaler Inhale 2 puffs into the lungs every 6 (six) hours as needed for wheezing or shortness of breath.   Alpha-D-Galactosidase (BEANO PO) Take by mouth.   celecoxib (CELEBREX) 200 MG capsule Take 200 mg by mouth 2 (two) times  daily.   chlorpheniramine-HYDROcodone  (TUSSIONEX) 10-8 MG/5ML Take 5 mLs by mouth as needed.   Continuous Glucose Sensor (FREESTYLE LIBRE 3 PLUS SENSOR) MISC Change sensor every 15 days.   DULoxetine (CYMBALTA) 60 MG capsule Take 60 mg daily by mouth.    fluticasone (FLONASE) 50 MCG/ACT nasal spray Place 2 sprays into both nostrils daily as needed for allergies or rhinitis.   furosemide (LASIX) 20 MG tablet Take 20 mg by mouth daily as needed for fluid.   gabapentin (NEURONTIN) 300 MG capsule Take 600 mg by mouth 3 (three) times daily.   glipiZIDE  (GLUCOTROL  XL) 5 MG 24 hr tablet TAKE 1 TABLET BY MOUTH DAILY  WITH BREAKFAST   HYDROcodone -acetaminophen  (NORCO) 7.5-325 MG tablet Take 1 tablet by mouth 2 (two) times daily as needed.   lidocaine  (LIDODERM ) 5 % 1 patch daily.   losartan  (COZAAR ) 25 MG tablet TAKE ONE TABLET BY MOUTH ONCE DAILY   Omega-3 Fatty Acids (OMEGA-3 FISH OIL PO) Take by mouth.   pantoprazole  (PROTONIX ) 40 MG tablet TAKE ONE TABLET BY MOUTH ONCE TO TWICE DAILY BEFORE A meal   rOPINIRole (REQUIP) 0.5 MG tablet Take 1 mg by mouth at bedtime.   rosuvastatin  (CRESTOR ) 10 MG tablet Take 1 tablet (10 mg total) by mouth daily.   simethicone  (MYLICON) 125 MG chewable tablet Chew 125 mg by mouth every 6 (six) hours as needed for flatulence.   tiZANidine (ZANAFLEX) 2 MG tablet Take 2 mg by mouth daily as needed.   TRESIBA  FLEXTOUCH 100 UNIT/ML FlexTouch Pen INJECT 55 UNITS SUBCUTANEOUSLY AT BEDTIME   VITAMIN D -VITAMIN K PO Take by mouth.   zolpidem (AMBIEN) 10 MG tablet Take 10  mg by mouth at bedtime.   benzonatate (TESSALON) 200 MG capsule Take 200 mg by mouth 3 (three) times daily. (Patient not taking: Reported on 01/12/2024)   modafinil (PROVIGIL) 100 MG tablet Take 200 mg by mouth daily. (Patient not taking: Reported on 01/12/2024)   Suvorexant (BELSOMRA) 20 MG TABS Take 20 mg by mouth at bedtime. (Patient not taking: Reported on 01/12/2024)   [DISCONTINUED] feeding supplement, GLUCERNA SHAKE, (GLUCERNA SHAKE) LIQD Take 237 mLs by mouth daily. (Patient not taking: Reported on 01/12/2024)   No facility-administered encounter medications on file as of 01/12/2024.    ALLERGIES: No Known Allergies  VACCINATION STATUS:  There is no immunization history on file for this patient.  Diabetes She presents for her follow-up diabetic visit. She has type 2 diabetes mellitus. Onset time: She was diagnosed at approximate age of 52 years. Her disease course has been improving. There are no hypoglycemic associated symptoms. Pertinent negatives for hypoglycemia include no confusion, headaches, pallor or seizures. Associated symptoms include fatigue. Pertinent negatives for diabetes include no chest pain, no polydipsia, no polyphagia, no polyuria and no weight loss. There are no hypoglycemic complications. Symptoms are stable. There are no diabetic complications. Risk factors for coronary artery disease include diabetes mellitus, obesity, sedentary lifestyle, post-menopausal, tobacco exposure, dyslipidemia and hypertension. Current diabetic treatment includes insulin  injections and oral agent (monotherapy) (and Ozempic). She is compliant with treatment most of the time. Her weight is increasing steadily. She is following a generally healthy diet. When asked about meal planning, she reported none. She has not had a previous visit with a dietitian. She rarely participates in exercise. Her home blood glucose trend is decreasing steadily. Her overall blood glucose range is 140-180 mg/dl. (She  presents today with her CGM showing improving, mostly at goal glycemic profile overall.  Her POCT  A1c today is 7.3%, improving from last visit of 8.1%.  Analysis of her CGM shows TIR 78%, TAR 22%, TBR 0% with a GMI of 7%.  She denies any hypoglycemia.  She has been having trouble with her fibromyalgia flaring up.) An ACE inhibitor/angiotensin II receptor blocker is not being taken. She does not see a podiatrist.Eye exam is current.  Hyperlipidemia This is a chronic problem. The current episode started more than 1 year ago. The problem is controlled. Recent lipid tests were reviewed and are normal. Exacerbating diseases include chronic renal disease, diabetes and obesity. Factors aggravating her hyperlipidemia include fatty foods. Pertinent negatives include no chest pain. Current antihyperlipidemic treatment includes statins. The current treatment provides significant improvement of lipids. Compliance problems include adherence to exercise and adherence to diet.  Risk factors for coronary artery disease include diabetes mellitus, dyslipidemia, obesity, a sedentary lifestyle, post-menopausal and hypertension.  Hypertension This is a chronic problem. The current episode started more than 1 year ago. The problem has been gradually improving since onset. The problem is controlled. Pertinent negatives include no chest pain or headaches. There are no associated agents to hypertension. Risk factors for coronary artery disease include diabetes mellitus, dyslipidemia, obesity, post-menopausal state and sedentary lifestyle. Past treatments include angiotensin blockers and diuretics. The current treatment provides moderate improvement. There are no compliance problems.  Hypertensive end-organ damage includes kidney disease. Identifiable causes of hypertension include chronic renal disease and sleep apnea.     Review of systems  Constitutional: + increasing body weight,  current Body mass index is 40.79 kg/m. , no  fatigue, no subjective hyperthermia, no subjective hypothermia Eyes: no blurry vision, no xerophthalmia ENT: no sore throat, no nodules palpated in throat, no dysphagia/odynophagia, no hoarseness Cardiovascular: no chest pain, no shortness of breath, no palpitations, no leg swelling Respiratory: no cough, no shortness of breath Gastrointestinal: no nausea/vomiting/diarrhea Musculoskeletal: + chronic diffuse muscle/joint aches (now seeing pain management specialist) Skin: no rashes, no hyperemia Neurological: no tremors, no numbness, no tingling, no dizziness Psychiatric: no depression, no anxiety    Objective:    BP 102/80 (BP Location: Right Arm, Patient Position: Sitting, Cuff Size: Large)   Pulse 81   Ht 5\' 2"  (1.575 m)   Wt 223 lb (101.2 kg)   BMI 40.79 kg/m   Wt Readings from Last 3 Encounters:  01/12/24 223 lb (101.2 kg)  09/14/23 221 lb 6.4 oz (100.4 kg)  05/13/23 220 lb 6.4 oz (100 kg)    BP Readings from Last 3 Encounters:  01/12/24 102/80  09/14/23 112/70  05/13/23 120/68     Physical Exam- Limited  Constitutional:  Body mass index is 40.79 kg/m. , not in acute distress, normal state of mind Eyes:  EOMI, no exophthalmos Musculoskeletal: no gross deformities, strength intact in all four extremities, no gross restriction of joint movements Skin:  no rashes, no hyperemia Neurological: no tremor with outstretched hands   Diabetic Foot Exam - Simple   Simple Foot Form Visual Inspection No deformities, no ulcerations, no other skin breakdown bilaterally: Yes Sensation Testing Intact to touch and monofilament testing bilaterally: Yes Pulse Check Posterior Tibialis and Dorsalis pulse intact bilaterally: Yes Comments    CMP ( most recent) CMP     Component Value Date/Time   NA 135 05/27/2021 1345   K 4.6 05/27/2021 1345   CL 98 05/27/2021 1345   CO2 22 05/27/2021 1345   GLUCOSE 144 (H) 05/27/2021 1345   GLUCOSE 185 (H) 07/27/2017 0916   BUN  15  05/27/2021 1345   CREATININE 0.89 05/27/2021 1345   CALCIUM  9.2 05/27/2021 1345   PROT 7.2 05/27/2021 1345   ALBUMIN 4.3 05/27/2021 1345   AST 18 05/27/2021 1345   ALT 9 05/27/2021 1345   ALKPHOS 81 05/27/2021 1345   BILITOT 0.6 05/27/2021 1345   GFRNONAA 75 09/25/2020 0000   GFRAA 87 09/25/2020 0000    Lipid Panel     Component Value Date/Time   CHOL 110 09/25/2020 0000   CHOL 104 05/16/2020 1002   TRIG 101 10/27/2021 0000   HDL 47 09/25/2020 0000   HDL 47 05/16/2020 1002   LDLCALC 17 10/27/2021 0000   LDLCALC 37 05/16/2020 1002   LABVLDL 20 05/16/2020 1002      Assessment & Plan:   1) Controlled type 2 diabetes mellitus without complication  - Erica Price has currently uncontrolled symptomatic type 2 DM since 63 years of age.  She presents today with her CGM showing improving, mostly at goal glycemic profile overall.  Her POCT A1c today is 7.3%, improving from last visit of 8.1%.  Analysis of her CGM shows TIR 78%, TAR 22%, TBR 0% with a GMI of 7%.  She denies any hypoglycemia.  She has been having trouble with her fibromyalgia flaring up.  -her diabetes is complicated by obesity/sedentary life and she remains at a high risk for more acute and chronic complications which include CAD, CVA, CKD, retinopathy, and neuropathy. These are all discussed in detail with her.  - Nutritional counseling repeated at each appointment due to patients tendency to fall back in to old habits.  - The patient admits there is a room for improvement in their diet and drink choices. -  Suggestion is made for the patient to avoid simple carbohydrates from their diet including Cakes, Sweet Desserts / Pastries, Ice Cream, Soda (diet and regular), Sweet Tea, Candies, Chips, Cookies, Sweet Pastries, Store Bought Juices, Alcohol in Excess of 1-2 drinks a day, Artificial Sweeteners, Coffee Creamer, and "Sugar-free" Products. This will help patient to have stable blood glucose profile and potentially  avoid unintended weight gain.   - I encouraged the patient to switch to unprocessed or minimally processed complex starch and increased protein intake (animal or plant source), fruits, and vegetables.   - Patient is advised to stick to a routine mealtimes to eat 3 meals a day and avoid unnecessary snacks (to snack only to correct hypoglycemia).  - I have approached her with the following individualized plan to manage diabetes and patient agrees:   -Based on her presenting glycemic profile, she will not need prandial insulin  for now.    - She is advised to lower Tresiba  to 55 units SQ nightly and continue her Ozempic 2 mg SQ weekly, and Glipizide  5 mg XL daily at breakfast.  We did try stopping this in the past and her glucose rebounded.  -She did not tolerate Metformin  in the past, even in the ER form.    -She is encouraged to consistently monitor glucose twice daily (using her CGM), before breakfast and before bed and call the clinic if she has readings less than 70 or greater than 200 for 3 tests in a row.  She is benefiting from her CGM, is advised to continue using it.    - Patient specific target  A1c;  LDL, HDL, Triglycerides,  were discussed in detail.  2) Blood Pressure /Hypertension: Her blood pressure is controlled to target.  She is advised to continue Lasix 40  mg po daily prn for fluid and Losartan  25 mg po daily.  3) Lipids/Hyperlipidemia:   Her most recent lipid panel from 12/27/23 shows controlled LDL of 34.  She is advised to continue Crestor  10 mg po daily before bed.  Side effects and precautions discussed with her.   4)  Weight/Diet:  Her Body mass index is 40.79 kg/m.-   clearly complicating her diabetes care.  She is a candidate for modest weight loss.  I discussed with her the fact that loss of 5 - 10% of her  current body weight will have the most impact on her diabetes management.  CDE Consult will be initiated . Exercise, and detailed carbohydrates information  provided  -  detailed on discharge instructions.  5) Chronic Care/Health Maintenance: -she on ARB and Statin medication and is encouraged to initiate and continue to follow up with Ophthalmology, Dentist, Podiatrist at least yearly or according to recommendations, and advised to stay away from smoking. I have recommended yearly flu vaccine and pneumonia vaccine at least every 5 years; moderate intensity exercise for up to 150 minutes weekly; and  sleep for at least 7 hours a day.  - she is advised to maintain close follow up with Minus Amel, MD for primary care needs, as well as her other providers for optimal and coordinated care.     I spent  20  minutes in the care of the patient today including review of labs from CMP, Lipids, Thyroid  Function, Hematology (current and previous including abstractions from other facilities); face-to-face time discussing  her blood glucose readings/logs, discussing hypoglycemia and hyperglycemia episodes and symptoms, medications doses, her options of short and long term treatment based on the latest standards of care / guidelines;  discussion about incorporating lifestyle medicine;  and documenting the encounter. Risk reduction counseling performed per USPSTF guidelines to reduce obesity and cardiovascular risk factors.     Please refer to Patient Instructions for Blood Glucose Monitoring and Insulin /Medications Dosing Guide"  in media tab for additional information. Please  also refer to " Patient Self Inventory" in the Media  tab for reviewed elements of pertinent patient history.  Erica Price participated in the discussions, expressed understanding, and voiced agreement with the above plans.  All questions were answered to her satisfaction. she is encouraged to contact clinic should she have any questions or concerns prior to her return visit.    Follow up plan: - Return in about 4 months (around 05/14/2024) for Diabetes F/U with A1c in office, No  previsit labs, Bring meter and logs.   Hulon Magic, [facility] [address] [place], [place] [address] Phone: [phone] Fax: [phone]  01/12/2024, 2:02 PM

## 2024-01-19 DIAGNOSIS — Z79891 Long term (current) use of opiate analgesic: Secondary | ICD-10-CM | POA: Diagnosis not present

## 2024-01-19 DIAGNOSIS — G894 Chronic pain syndrome: Secondary | ICD-10-CM | POA: Diagnosis not present

## 2024-01-19 DIAGNOSIS — M7712 Lateral epicondylitis, left elbow: Secondary | ICD-10-CM | POA: Diagnosis not present

## 2024-01-19 DIAGNOSIS — M7551 Bursitis of right shoulder: Secondary | ICD-10-CM | POA: Diagnosis not present

## 2024-01-19 DIAGNOSIS — G5602 Carpal tunnel syndrome, left upper limb: Secondary | ICD-10-CM | POA: Diagnosis not present

## 2024-01-20 ENCOUNTER — Other Ambulatory Visit: Payer: Self-pay | Admitting: Nurse Practitioner

## 2024-01-20 NOTE — Telephone Encounter (Signed)
 Its ok, I can approve this one.

## 2024-02-08 DIAGNOSIS — M25562 Pain in left knee: Secondary | ICD-10-CM | POA: Diagnosis not present

## 2024-02-08 DIAGNOSIS — M25561 Pain in right knee: Secondary | ICD-10-CM | POA: Diagnosis not present

## 2024-02-08 DIAGNOSIS — R262 Difficulty in walking, not elsewhere classified: Secondary | ICD-10-CM | POA: Diagnosis not present

## 2024-02-08 DIAGNOSIS — M170 Bilateral primary osteoarthritis of knee: Secondary | ICD-10-CM | POA: Diagnosis not present

## 2024-02-09 ENCOUNTER — Other Ambulatory Visit: Payer: Self-pay | Admitting: Gastroenterology

## 2024-02-10 NOTE — Telephone Encounter (Signed)
 Refills provided but needs ov.

## 2024-02-11 NOTE — Telephone Encounter (Signed)
 Pt scheduled

## 2024-02-15 DIAGNOSIS — M25561 Pain in right knee: Secondary | ICD-10-CM | POA: Diagnosis not present

## 2024-02-15 DIAGNOSIS — M25562 Pain in left knee: Secondary | ICD-10-CM | POA: Diagnosis not present

## 2024-02-15 DIAGNOSIS — R262 Difficulty in walking, not elsewhere classified: Secondary | ICD-10-CM | POA: Diagnosis not present

## 2024-02-15 DIAGNOSIS — M170 Bilateral primary osteoarthritis of knee: Secondary | ICD-10-CM | POA: Diagnosis not present

## 2024-02-22 DIAGNOSIS — R262 Difficulty in walking, not elsewhere classified: Secondary | ICD-10-CM | POA: Diagnosis not present

## 2024-02-22 DIAGNOSIS — M25561 Pain in right knee: Secondary | ICD-10-CM | POA: Diagnosis not present

## 2024-02-22 DIAGNOSIS — M25562 Pain in left knee: Secondary | ICD-10-CM | POA: Diagnosis not present

## 2024-02-22 DIAGNOSIS — M170 Bilateral primary osteoarthritis of knee: Secondary | ICD-10-CM | POA: Diagnosis not present

## 2024-02-24 ENCOUNTER — Other Ambulatory Visit: Payer: Self-pay | Admitting: Nurse Practitioner

## 2024-03-07 ENCOUNTER — Telehealth: Payer: Self-pay | Admitting: Nurse Practitioner

## 2024-03-07 NOTE — Telephone Encounter (Signed)
 Called pt - she is aware of PAP to pick up her meds and pen needles

## 2024-03-07 NOTE — Telephone Encounter (Signed)
 Pt picked up pt assistance.

## 2024-03-11 NOTE — Progress Notes (Unsigned)
 GI Office Note    Referring Provider: Marvine Rush, MD Primary Care Physician:  Marvine Rush, MD  Primary Gastroenterologist: Carlin POUR. Cindie, DO   Chief Complaint   No chief complaint on file.   History of Present Illness   Erica Price is a 63 y.o. female presenting today for follow up. Last seen 2024. H/o GERD, gas/bloating.      EGD 05/2021: -small hh -mild Schatzki ring s/p dilation -gastritis (slight chronic inflammation, negative H. pylori) -duodenal erosions (normal bx)   Colonoscopy 05/2021: -Nonbleeding internal hemorrhoids -Diverticulosis sigmoid colon -7 mm polyp removed from descending colon (tubular adenoma) -Normal-appearing ileum -Colonoscopy in 5 years    Medications   Current Outpatient Medications  Medication Sig Dispense Refill   albuterol  (PROVENTIL  HFA;VENTOLIN  HFA) 108 (90 Base) MCG/ACT inhaler Inhale 2 puffs into the lungs every 6 (six) hours as needed for wheezing or shortness of breath.     Alpha-D-Galactosidase (BEANO PO) Take by mouth.     benzonatate (TESSALON) 200 MG capsule Take 200 mg by mouth 3 (three) times daily. (Patient not taking: Reported on 01/12/2024)     celecoxib (CELEBREX) 200 MG capsule Take 200 mg by mouth 2 (two) times daily.     chlorpheniramine-HYDROcodone  (TUSSIONEX) 10-8 MG/5ML Take 5 mLs by mouth as needed.     Continuous Glucose Sensor (FREESTYLE LIBRE 3 PLUS SENSOR) MISC Change sensor every 15 days. 6 each 3   DULoxetine (CYMBALTA) 60 MG capsule Take 60 mg daily by mouth.      fluticasone (FLONASE) 50 MCG/ACT nasal spray INSTILL TWO (2) SPRAYS IN EACH NOSTRIL DAILY 16 g 11   furosemide (LASIX) 20 MG tablet Take 20 mg by mouth daily as needed for fluid.     gabapentin (NEURONTIN) 300 MG capsule Take 600 mg by mouth 3 (three) times daily.     glipiZIDE  (GLUCOTROL  XL) 5 MG 24 hr tablet TAKE 1 TABLET BY MOUTH ONCE DAILY WITH BREAKFAST 90 tablet 11   HYDROcodone -acetaminophen  (NORCO) 7.5-325 MG tablet Take  1 tablet by mouth 2 (two) times daily as needed.     lidocaine  (LIDODERM ) 5 % 1 patch daily.     losartan  (COZAAR ) 25 MG tablet TAKE ONE TABLET BY MOUTH ONCE DAILY 90 tablet 0   modafinil (PROVIGIL) 100 MG tablet Take 200 mg by mouth daily. (Patient not taking: Reported on 01/12/2024)     Omega-3 Fatty Acids (OMEGA-3 FISH OIL PO) Take by mouth.     pantoprazole  (PROTONIX ) 40 MG tablet TAKE 1 TABLET BY MOUTH 1 TO 2  TIMES DAILY BEFORE MEALS 200 tablet 0   rOPINIRole (REQUIP) 0.5 MG tablet Take 1 mg by mouth at bedtime.     rosuvastatin  (CRESTOR ) 10 MG tablet Take 1 tablet (10 mg total) by mouth daily. 90 tablet 3   simethicone  (MYLICON) 125 MG chewable tablet Chew 125 mg by mouth every 6 (six) hours as needed for flatulence.     Suvorexant (BELSOMRA) 20 MG TABS Take 20 mg by mouth at bedtime. (Patient not taking: Reported on 01/12/2024)     tiZANidine (ZANAFLEX) 2 MG tablet Take 2 mg by mouth daily as needed.     TRESIBA  FLEXTOUCH 100 UNIT/ML FlexTouch Pen INJECT 55 UNITS SUBCUTANEOUSLY AT BEDTIME 15 mL 10   VITAMIN D -VITAMIN K PO Take by mouth.     zolpidem (AMBIEN) 10 MG tablet Take 10 mg by mouth at bedtime.     No current facility-administered medications for this visit.  Allergies   Allergies as of 03/13/2024   (No Known Allergies)     Past Medical History   Past Medical History:  Diagnosis Date   Apnea    Poss OSA   Arthralgia    Arthritis    Depression    Diabetes mellitus without complication (HCC)    Fatigue    Fibromyalgia    GERD (gastroesophageal reflux disease)    Hypertension    IBS (irritable bowel syndrome)    Myalgia    Pinched nerve    lumbar-l5-[id number]   PONV (postoperative nausea and vomiting)    Snoring     Past Surgical History   Past Surgical History:  Procedure Laterality Date   ARTHROSCOPIC REPAIR ACL Left    BALLOON DILATION N/A 06/05/2021   Procedure: BALLOON DILATION;  Surgeon: Cindie Carlin POUR, DO;  [facility]: [facility];  Service:  Endoscopy;  Laterality: N/A;   BIOPSY N/A 06/12/2014   Procedure: BIOPSY;  Surgeon: Margo LITTIE Haddock, MD;  Location: [facility];  Service: Endoscopy;  Laterality: N/A;   BIOPSY  09/14/2017   Procedure: BIOPSY;  Surgeon: Haddock Margo LITTIE, MD;  Location: [facility];  Service: Endoscopy;;  colon   BIOPSY  06/05/2021   Procedure: BIOPSY;  Surgeon: Cindie Carlin POUR, DO;  Location: [facility];  Service: Endoscopy;;   CARPAL TUNNEL RELEASE Right    CHOLECYSTECTOMY  1990s   CLUB FOOT RELEASE Left    x3   COLONOSCOPY WITH PROPOFOL  N/A 06/12/2014   Dr. Gwendalyn colon polyps removed/moderate divertiiculosis/small internal hemorrhoids/moderate sized external hemorrhoids. tubular adenomas. Next surveillance in Oct 2018.    COLONOSCOPY WITH PROPOFOL  N/A 09/14/2017   Dr. Haddock: simple adenomas, moderate diverticulosis in rectosigmoid colon, sigmoid colon, and descending colon. External and internal hemorrhoids, normal colon biopsies. simple adenomas. Needs surveillance in 3 years with Propofol , colowrap   COLONOSCOPY WITH PROPOFOL  N/A 06/05/2021   Procedure: COLONOSCOPY WITH PROPOFOL ;  Surgeon: Cindie Carlin POUR, DO;  [facility]: [facility];  Service: Endoscopy;  Laterality: N/A;  7:30am   ESOPHAGOGASTRODUODENOSCOPY (EGD) WITH PROPOFOL  N/A 06/12/2014   Dr. 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 benign gastric and duodenal biopsies   ESOPHAGOGASTRODUODENOSCOPY (EGD) WITH PROPOFOL  N/A 06/05/2021   Procedure: ESOPHAGOGASTRODUODENOSCOPY (EGD) WITH PROPOFOL ;  Surgeon: Cindie Carlin POUR, DO;  [facility]: [facility];  Service: Endoscopy;  Laterality: N/A;   POLYPECTOMY N/A 06/12/2014   Procedure: POLYPECTOMY;  Surgeon: Margo LITTIE Haddock, MD;  Location: [facility];  Service: Endoscopy;  Laterality: N/A;   POLYPECTOMY  09/14/2017   Procedure: POLYPECTOMY;  Surgeon: Haddock Margo LITTIE, MD;  [facility]: [facility];  Service: Endoscopy;;  colon   POLYPECTOMY  06/05/2021    Procedure: POLYPECTOMY;  Surgeon: Cindie Carlin POUR, DO;  [facility];  Service: Endoscopy;;   SAVORY DILATION N/A 06/12/2014   Procedure: SAVORY DILATION;  Surgeon: Margo LITTIE Haddock, MD;  Location: [facility];  Service: Endoscopy;  Laterality: N/A;  12.8-17    Past Family History   Family History  Problem Relation Age of Onset   Heart failure Father        Died, 2   Hypertension Mother        Living, 37   Breast cancer Mother    Kidney cancer Mother    Hypercholesterolemia Son    Breast cancer Sister    Pancreatic cancer Brother        passed age 39   Hypertension Sister  Colon cancer Neg Hx     Past Social History   Social History   Socioeconomic History   Marital status: Married    Spouse name: Erica Price   Number of children: 2   Years of education: Not on file   Highest education level: Associate degree: occupational, Scientist, product/process development, or vocational program  Occupational History   Occupation: disabled  Tobacco Use   Smoking status: Former    Current packs/day: 0.00    Average packs/day: 0.5 packs/day for 20.0 years (10.0 ttl pk-yrs)    Types: Cigarettes    Start date: 10/18/1991    Quit date: 10/18/2011    Years since quitting: 12.4   Smokeless tobacco: Never   Tobacco comments:    quit in 2014  Vaping Use   Vaping status: Never Used  Substance and Sexual Activity   Alcohol use: No   Drug use: No   Sexual activity: Not Currently    Birth control/protection: Post-menopausal  Other Topics Concern   Not on file  Social History Narrative   She is currently unemployed.  She was terminated on 06/15/2013.  She was previously a Architect, worked at [employer], and worked in Designer, fashion/clothing for 25 years.   She lives with husband.  They have two children.   Social Drivers of Corporate investment banker Strain: Low Risk  (11/11/2017)   Overall Financial Resource Strain (CARDIA)    Difficulty of Paying Living Expenses: Not hard at all  Food Insecurity: No Food  Insecurity (11/11/2017)   Hunger Vital Sign    Worried About Running Out of Food in the Last Year: Never true    Ran Out of Food in the Last Year: Never true  Transportation Needs: No Transportation Needs (11/11/2017)   PRAPARE - Administrator, Civil Service (Medical): No    Lack of Transportation (Non-Medical): No  Physical Activity: Insufficiently Active (11/11/2017)   Exercise Vital Sign    Days of Exercise per Week: 1 day    Minutes of Exercise per Session: 20 min  Stress: No Stress Concern Present (11/11/2017)   [employer] of Occupational Health - Occupational Stress Questionnaire    Feeling of Stress : Not at all  Social Connections: Moderately Integrated (11/11/2017)   Social Connection and Isolation Panel    Frequency of Communication with Friends and Family: More than three times a week    Frequency of Social Gatherings with Friends and Family: Once a week    Attends Religious Services: 1 to 4 times per year    Active Member of [employer] or Organizations: No    Attends Banker Meetings: Never    Marital Status: Married  Catering manager Violence: Not At Risk (11/11/2017)   Humiliation, Afraid, Rape, and Kick questionnaire    Fear of Current or Ex-Partner: No    Emotionally Abused: No    Physically Abused: No    Sexually Abused: No    Review of Systems   General: Negative for anorexia, weight loss, fever, chills, fatigue, weakness. ENT: Negative for hoarseness, difficulty swallowing , nasal congestion. CV: Negative for chest pain, angina, palpitations, dyspnea on exertion, peripheral edema.  Respiratory: Negative for dyspnea at rest, dyspnea on exertion, cough, sputum, wheezing.  GI: See history of present illness. GU:  Negative for dysuria, hematuria, urinary incontinence, urinary frequency, nocturnal urination.  Endo: Negative for unusual weight change.     Physical Exam   There were no vitals taken for this visit.   General:  Well-nourished, well-developed in no acute distress.  Eyes: No icterus. Mouth: Oropharyngeal mucosa moist and pink   Lungs: Clear to auscultation bilaterally.  Heart: Regular rate and rhythm, no murmurs rubs or gallops.  Abdomen: Bowel sounds are normal, nontender, nondistended, no hepatosplenomegaly or masses,  no abdominal bruits or hernia , no rebound or guarding.  Rectal: not performed Extremities: No lower extremity edema. No clubbing or deformities. Neuro: Alert and oriented x 4   Skin: Warm and dry, no jaundice.   Psych: Alert and cooperative, normal mood and affect.  Labs   Lab Results  Component Value Date   HGBA1C 7.3 (A) 01/12/2024    Imaging Studies   No results found.  Assessment/Plan:        Erica Price, MHS, PA-C [facility]

## 2024-03-13 ENCOUNTER — Encounter: Payer: Self-pay | Admitting: Gastroenterology

## 2024-03-13 ENCOUNTER — Ambulatory Visit (INDEPENDENT_AMBULATORY_CARE_PROVIDER_SITE_OTHER): Admitting: Gastroenterology

## 2024-03-13 VITALS — BP 115/75 | HR 76 | Temp 98.2°F | Ht 62.0 in | Wt 223.2 lb

## 2024-03-13 DIAGNOSIS — K2970 Gastritis, unspecified, without bleeding: Secondary | ICD-10-CM

## 2024-03-13 DIAGNOSIS — R140 Abdominal distension (gaseous): Secondary | ICD-10-CM | POA: Diagnosis not present

## 2024-03-13 DIAGNOSIS — K219 Gastro-esophageal reflux disease without esophagitis: Secondary | ICD-10-CM

## 2024-03-13 MED ORDER — PANTOPRAZOLE SODIUM 40 MG PO TBEC: DELAYED_RELEASE_TABLET | ORAL | 3 refills | Status: AC

## 2024-03-13 NOTE — Patient Instructions (Signed)
 Continue pantoprazole  once to twice daily before a meal for reflux/gastritis. Try cutting back to once a day. If you have recurrent symptoms, you can go back to twice daily.  Continue GasX and Beano as needed.  The list of foods listed below can increase gas/bloating. If you are eating anything on the list, several times each week, try cutting back to see if this helps your symptoms.  Fruits Fresh, dried, and juiced forms of apple, pear, watermelon, peach, plum, cherries, apricots, blackberries, boysenberries, figs, nectarines, and mango. Avocado. Vegetables Chicory root, artichoke, asparagus, cabbage, snow peas, Brussels sprouts, broccoli, sugar snap peas, mushrooms, celery, and cauliflower. Onions, garlic, leeks, and the white part of scallions. Grains Wheat, including kamut, durum, and semolina. Barley and bulgur. Couscous. Wheat-based cereals. Wheat noodles, bread, crackers, and pastries. Meats and other proteins Fried or fatty meat. Sausage. Cashews and pistachios. Soybeans, baked beans, black beans, chickpeas, kidney beans, fava beans, navy beans, lentils, black-eyed peas, and split peas. Dairy Milk, yogurt, ice cream, and soft cheese. Cream and sour cream. Milk-based sauces. Custard. Buttermilk. Soy milk. Seasoning and other foods Any sugar-free gum or candy. Foods that contain artificial sweeteners such as sorbitol, mannitol, isomalt, or xylitol. Foods that contain honey, high-fructose corn syrup, or agave. Bouillon, vegetable stock, beef stock, and chicken stock. Garlic and onion powder. Condiments made with onion, such as hummus, chutney, pickles, relish, salad dressing, and salsa. Tomato paste. Beverages Chicory-based drinks. Coffee substitutes. Chamomile tea. Fennel tea. Sweet or fortified wines such as port or sherry. Diet soft drinks made with isomalt, mannitol, maltitol, sorbitol, or xylitol. Apple, pear, and mango juice. Juices with high-fructose corn syrup. The items listed above  may not be a complete list of foods and beverages you should avoid. Contact a dietitian for more information.

## 2024-03-15 DIAGNOSIS — G894 Chronic pain syndrome: Secondary | ICD-10-CM | POA: Diagnosis not present

## 2024-03-15 DIAGNOSIS — G5602 Carpal tunnel syndrome, left upper limb: Secondary | ICD-10-CM | POA: Diagnosis not present

## 2024-03-15 DIAGNOSIS — M7551 Bursitis of right shoulder: Secondary | ICD-10-CM | POA: Diagnosis not present

## 2024-03-15 DIAGNOSIS — M7712 Lateral epicondylitis, left elbow: Secondary | ICD-10-CM | POA: Diagnosis not present

## 2024-03-20 DIAGNOSIS — M797 Fibromyalgia: Secondary | ICD-10-CM | POA: Diagnosis not present

## 2024-03-20 DIAGNOSIS — R002 Palpitations: Secondary | ICD-10-CM | POA: Diagnosis not present

## 2024-03-20 DIAGNOSIS — E538 Deficiency of other specified B group vitamins: Secondary | ICD-10-CM | POA: Diagnosis not present

## 2024-04-11 ENCOUNTER — Telehealth: Payer: Self-pay | Admitting: Nurse Practitioner

## 2024-04-11 DIAGNOSIS — I493 Ventricular premature depolarization: Secondary | ICD-10-CM | POA: Diagnosis not present

## 2024-04-11 DIAGNOSIS — E1165 Type 2 diabetes mellitus with hyperglycemia: Secondary | ICD-10-CM | POA: Diagnosis not present

## 2024-04-11 DIAGNOSIS — Z7689 Persons encountering health services in other specified circumstances: Secondary | ICD-10-CM | POA: Diagnosis not present

## 2024-04-11 DIAGNOSIS — Z79899 Other long term (current) drug therapy: Secondary | ICD-10-CM | POA: Diagnosis not present

## 2024-04-11 DIAGNOSIS — M545 Low back pain, unspecified: Secondary | ICD-10-CM | POA: Diagnosis not present

## 2024-04-11 DIAGNOSIS — G8929 Other chronic pain: Secondary | ICD-10-CM | POA: Diagnosis not present

## 2024-04-11 DIAGNOSIS — G2581 Restless legs syndrome: Secondary | ICD-10-CM | POA: Diagnosis not present

## 2024-04-11 DIAGNOSIS — M797 Fibromyalgia: Secondary | ICD-10-CM | POA: Diagnosis not present

## 2024-04-11 DIAGNOSIS — K219 Gastro-esophageal reflux disease without esophagitis: Secondary | ICD-10-CM | POA: Diagnosis not present

## 2024-04-11 NOTE — Telephone Encounter (Signed)
 Let pt know that pt assistance of ozempic is ready for pick up

## 2024-04-13 DIAGNOSIS — R262 Difficulty in walking, not elsewhere classified: Secondary | ICD-10-CM | POA: Diagnosis not present

## 2024-04-13 DIAGNOSIS — M25562 Pain in left knee: Secondary | ICD-10-CM | POA: Diagnosis not present

## 2024-04-13 DIAGNOSIS — M25661 Stiffness of right knee, not elsewhere classified: Secondary | ICD-10-CM | POA: Diagnosis not present

## 2024-04-13 DIAGNOSIS — M1712 Unilateral primary osteoarthritis, left knee: Secondary | ICD-10-CM | POA: Diagnosis not present

## 2024-04-13 DIAGNOSIS — M25561 Pain in right knee: Secondary | ICD-10-CM | POA: Diagnosis not present

## 2024-04-13 DIAGNOSIS — M25662 Stiffness of left knee, not elsewhere classified: Secondary | ICD-10-CM | POA: Diagnosis not present

## 2024-04-13 NOTE — Telephone Encounter (Signed)
 Pt pciked up pt assistance

## 2024-05-10 ENCOUNTER — Other Ambulatory Visit (HOSPITAL_COMMUNITY): Payer: Self-pay

## 2024-05-10 ENCOUNTER — Telehealth: Payer: Self-pay

## 2024-05-10 NOTE — Telephone Encounter (Signed)
 Pharmacy Patient Advocate Encounter   Received notification from CoverMyMeds that prior authorization for Freestyle libre 3 plus sensor is required/requested.   Insurance verification completed.   The patient is insured through [facility] .   Per test claim: PA required; PA submitted to above mentioned insurance via Latent Key/confirmation #/EOC [id number] Status is pending

## 2024-05-12 NOTE — Telephone Encounter (Signed)
 Pharmacy Patient Advocate Encounter  Received notification from [employer] that Prior Authorization for [facility] 3 plus sensor has been APPROVED from 05/10/24 to 08/23/2024   PA #/Case ID/Reference #: [id number]

## 2024-05-15 NOTE — Telephone Encounter (Signed)
 Patient was called and made aware.

## 2024-05-18 LAB — LAB REPORT - SCANNED
Albumin, Urine POC: 17.8
Albumin/Creatinine Ratio, Urine, POC: 14
Creatinine, POC: 128.4 mg/dL
EGFR: 74
TSH: 1.94 (ref 0.41–5.90)

## 2024-05-23 ENCOUNTER — Encounter: Payer: Self-pay | Admitting: Nurse Practitioner

## 2024-05-23 ENCOUNTER — Ambulatory Visit: Admitting: Nurse Practitioner

## 2024-05-23 VITALS — BP 132/88 | HR 80 | Ht 61.0 in | Wt 219.6 lb

## 2024-05-23 DIAGNOSIS — E1165 Type 2 diabetes mellitus with hyperglycemia: Secondary | ICD-10-CM | POA: Diagnosis not present

## 2024-05-23 DIAGNOSIS — Z7984 Long term (current) use of oral hypoglycemic drugs: Secondary | ICD-10-CM

## 2024-05-23 DIAGNOSIS — E782 Mixed hyperlipidemia: Secondary | ICD-10-CM

## 2024-05-23 DIAGNOSIS — Z794 Long term (current) use of insulin: Secondary | ICD-10-CM

## 2024-05-23 DIAGNOSIS — Z7985 Long-term (current) use of injectable non-insulin antidiabetic drugs: Secondary | ICD-10-CM

## 2024-05-23 NOTE — Progress Notes (Signed)
 05/23/2024, 3:23 PM  Endocrinology follow-up note   Subjective:    Patient ID: Erica Price, female    DOB: [birth year].  Erica Price is being seen  in follow-up for management of currently uncontrolled symptomatic diabetes requested by  Shona Norleen PEDLAR, MD.   Past Medical History:  Diagnosis Date   Apnea    Poss OSA   Arthralgia    Arthritis    Depression    Diabetes mellitus without complication (HCC)    Fatigue    Fibromyalgia    GERD (gastroesophageal reflux disease)    Hypertension    IBS (irritable bowel syndrome)    Myalgia    Pinched nerve    lumbar-l5-s1   PONV (postoperative nausea and vomiting)    Snoring     Past Surgical History:  Procedure Laterality Date   ARTHROSCOPIC REPAIR ACL Left    BALLOON DILATION N/A 06/05/2021   Procedure: BALLOON DILATION;  Surgeon: Cindie Carlin POUR, DO;  [facility];  Service: Endoscopy;  Laterality: N/A;   BIOPSY N/A 06/12/2014   Procedure: BIOPSY;  Surgeon: Margo LITTIE Haddock, MD;  Location: [facility];  Service: Endoscopy;  Laterality: N/A;   BIOPSY  09/14/2017   Procedure: BIOPSY;  Surgeon: Haddock Margo LITTIE, MD;  [facility];  Service: Endoscopy;;  colon   BIOPSY  06/05/2021   Procedure: BIOPSY;  Surgeon: Cindie Carlin POUR, DO;  [facility];  Service: Endoscopy;;   CARPAL TUNNEL RELEASE Right    CHOLECYSTECTOMY  1990s   CLUB FOOT RELEASE Left    x3   COLONOSCOPY WITH PROPOFOL  N/A 06/12/2014   Dr. Gwendalyn colon polyps removed/moderate divertiiculosis/small internal hemorrhoids/moderate sized external hemorrhoids. tubular adenomas. Next surveillance in Oct 2018.    COLONOSCOPY WITH PROPOFOL  N/A 09/14/2017   Dr. Haddock: simple adenomas, moderate diverticulosis in rectosigmoid colon, sigmoid colon, and descending colon. External and internal hemorrhoids, normal colon biopsies. simple adenomas. Needs surveillance  in 3 years with Propofol , colowrap   COLONOSCOPY WITH PROPOFOL  N/A 06/05/2021   Procedure: COLONOSCOPY WITH PROPOFOL ;  Surgeon: Cindie Carlin POUR, DO;  [facility];  Service: Endoscopy;  Laterality: N/A;  7:30am   ESOPHAGOGASTRODUODENOSCOPY (EGD) WITH PROPOFOL  N/A 06/12/2014   Dr. 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 benign gastric and duodenal biopsies   ESOPHAGOGASTRODUODENOSCOPY (EGD) WITH PROPOFOL  N/A 06/05/2021   Procedure: ESOPHAGOGASTRODUODENOSCOPY (EGD) WITH PROPOFOL ;  Surgeon: Cindie Carlin POUR, DO;  [facility];  Service: Endoscopy;  Laterality: N/A;   POLYPECTOMY N/A 06/12/2014   Procedure: POLYPECTOMY;  Surgeon: Margo LITTIE Haddock, MD;  Location: [facility];  Service: Endoscopy;  Laterality: N/A;   POLYPECTOMY  09/14/2017   Procedure: POLYPECTOMY;  Surgeon: Haddock Margo LITTIE, MD;  [facility];  Service: Endoscopy;;  colon   POLYPECTOMY  06/05/2021   Procedure: POLYPECTOMY;  Surgeon: Cindie Carlin POUR, DO;  [facility];  Service: Endoscopy;;   SAVORY DILATION N/A 06/12/2014   Procedure: SAVORY DILATION;  Surgeon: Margo LITTIE Haddock, MD;  Location: [facility];  Service: Endoscopy;  Laterality: N/A;  12.8-17  Social History   Socioeconomic History   Marital status: Married    Spouse name: Erica Price   Number of children: 2   Years of education: Not on file   Highest education level: Associate degree: occupational, Scientist, product/process development, or vocational program  Occupational History   Occupation: disabled  Tobacco Use   Smoking status: Former    Current packs/day: 0.00    Average packs/day: 0.5 packs/day for 20.0 years (10.0 ttl pk-yrs)    Types: Cigarettes    Start date: 10/18/1991    Quit date: 10/18/2011    Years since quitting: 12.6   Smokeless tobacco: Never   Tobacco comments:    quit in 2014  Vaping Use   Vaping status: Never Used  Substance and Sexual Activity   Alcohol use: No   Drug use: No   Sexual  activity: Not Currently    Birth control/protection: Post-menopausal  Other Topics Concern   Not on file  Social History Narrative   She is currently unemployed.  She was terminated on 06/15/2013.  She was previously a Architect, worked at [employer], and worked in Designer, fashion/clothing for 25 years.   She lives with husband.  They have two children.   Social Drivers of Corporate investment banker Strain: Low Risk  (11/11/2017)   Overall Financial Resource Strain (CARDIA)    Difficulty of Paying Living Expenses: Not hard at all  Food Insecurity: No Food Insecurity (11/11/2017)   Hunger Vital Sign    Worried About Running Out of Food in the Last Year: Never true    Ran Out of Food in the Last Year: Never true  Transportation Needs: No Transportation Needs (11/11/2017)   PRAPARE - Administrator, Civil Service (Medical): No    Lack of Transportation (Non-Medical): No  Physical Activity: Insufficiently Active (11/11/2017)   Exercise Vital Sign    Days of Exercise per Week: 1 day    Minutes of Exercise per Session: 20 min  Stress: No Stress Concern Present (11/11/2017)   [employer] of Occupational Health - Occupational Stress Questionnaire    Feeling of Stress : Not at all  Social Connections: Moderately Integrated (11/11/2017)   Social Connection and Isolation Panel    Frequency of Communication with Friends and Family: More than three times a week    Frequency of Social Gatherings with Friends and Family: Once a week    Attends Religious Services: 1 to 4 times per year    Active Member of [employer] or Organizations: No    Attends Banker Meetings: Never    Marital Status: Married    Family History  Problem Relation Age of Onset   Heart failure Father        Died, 66   Hypertension Mother        Living, 53   Breast cancer Mother    Kidney cancer Mother    Hypercholesterolemia Son    Breast cancer Sister    Pancreatic cancer Brother        passed age 27    Hypertension Sister    Colon cancer Neg Hx     Outpatient Encounter Medications as of 05/23/2024  Medication Sig   albuterol  (PROVENTIL  HFA;VENTOLIN  HFA) 108 (90 Base) MCG/ACT inhaler Inhale 2 puffs into the lungs every 6 (six) hours as needed for wheezing or shortness of breath.   Alpha-D-Galactosidase (BEANO PO) Take by mouth.   amitriptyline (ELAVIL) 10 MG tablet Take 10 mg by mouth at bedtime.  Azelastine HCl 137 MCG/SPRAY SOLN Place 2 sprays into both nostrils 2 (two) times daily.   Budeson-Glycopyrrol-Formoterol (BREZTRI AEROSPHERE IN) Inhale 2 puffs into the lungs 2 (two) times daily.   Calcium  Carbonate-Vit D-Min (CALCIUM  1200 PO) Take by mouth.   celecoxib (CELEBREX) 200 MG capsule Take 200 mg by mouth 2 (two) times daily. (Patient taking differently: Take 200 mg by mouth 2 (two) times daily as needed.)   Continuous Glucose Sensor (FREESTYLE LIBRE 3 PLUS SENSOR) MISC Change sensor every 15 days.   Cyanocobalamin (VITAMIN B-12 IJ) Inject 1,000 mcg as directed every 30 (thirty) days.   DULoxetine (CYMBALTA) 60 MG capsule Take 60 mg daily by mouth.    furosemide (LASIX) 20 MG tablet Take 20 mg by mouth daily as needed for fluid.   gabapentin (NEURONTIN) 300 MG capsule Take 600 mg by mouth 3 (three) times daily.   glipiZIDE  (GLUCOTROL  XL) 5 MG 24 hr tablet TAKE 1 TABLET BY MOUTH ONCE DAILY WITH BREAKFAST   HYDROcodone -acetaminophen  (NORCO) 7.5-325 MG tablet Take 1 tablet by mouth 2 (two) times daily as needed.   lidocaine  (LIDODERM ) 5 % 1 patch daily. (Patient taking differently: 1 patch daily as needed.)   losartan  (COZAAR ) 25 MG tablet TAKE ONE TABLET BY MOUTH ONCE DAILY   Omega-3 Fatty Acids (OMEGA-3 FISH OIL PO) Take by mouth.   pantoprazole  (PROTONIX ) 40 MG tablet TAKE 1 TABLET BY MOUTH 1 TO 2  TIMES DAILY BEFORE MEALS   rOPINIRole (REQUIP) 0.5 MG tablet Take 1 mg by mouth at bedtime.   rosuvastatin  (CRESTOR ) 10 MG tablet Take 1 tablet (10 mg total) by mouth daily.   Semaglutide,  1 MG/DOSE, (OZEMPIC, 1 MG/DOSE,) 2 MG/1.5ML SOPN Inject 2 mg into the skin once a week.   simethicone  (MYLICON) 125 MG chewable tablet Chew 125 mg by mouth every 6 (six) hours as needed for flatulence.   tiZANidine (ZANAFLEX) 2 MG tablet Take 2 mg by mouth daily as needed.   TRESIBA  FLEXTOUCH 100 UNIT/ML FlexTouch Pen INJECT 55 UNITS SUBCUTANEOUSLY AT BEDTIME (Patient taking differently: Inject 55 Units into the skin at bedtime.)   VITAMIN D -VITAMIN K PO Take by mouth.   zolpidem (AMBIEN) 10 MG tablet Take 10 mg by mouth at bedtime.   fluticasone (FLONASE) 50 MCG/ACT nasal spray INSTILL TWO (2) SPRAYS IN EACH NOSTRIL DAILY (Patient not taking: Reported on 05/23/2024)   No facility-administered encounter medications on file as of 05/23/2024.    ALLERGIES: No Known Allergies  VACCINATION STATUS:  There is no immunization history on file for this patient.  Diabetes She presents for her follow-up diabetic visit. She has type 2 diabetes mellitus. Onset time: She was diagnosed at approximate age of 52 years. Her disease course has been improving. There are no hypoglycemic associated symptoms. Pertinent negatives for hypoglycemia include no confusion, pallor or seizures. Associated symptoms include fatigue. Pertinent negatives for diabetes include no polydipsia, no polyphagia, no polyuria and no weight loss. There are no hypoglycemic complications. Symptoms are stable. There are no diabetic complications. Risk factors for coronary artery disease include diabetes mellitus, obesity, sedentary lifestyle, post-menopausal, tobacco exposure, dyslipidemia and hypertension. Current diabetic treatment includes insulin  injections and oral agent (monotherapy) (and Ozempic). She is compliant with treatment most of the time. Her weight is increasing steadily. She is following a generally healthy diet. When asked about meal planning, she reported none. She has not had a previous visit with a dietitian. She rarely  participates in exercise. Her home blood glucose trend is  decreasing steadily. Her overall blood glucose range is 140-180 mg/dl. (She presents today with her CGM showing improving, mostly at goal glycemic profile overall.  Her most recent A1c on 9/24 was 7.8%, increasing slightly from last visit of 7.3%.  Analysis of her CGM shows TIR 58%, TAR 42%, TBR 0% with a GMI of 7.5%.  She denies any hypoglycemia.  She has recently had an illness, thought it was COVID but tested negative.) An ACE inhibitor/angiotensin II receptor blocker is not being taken. She does not see a podiatrist.Eye exam is current.     Review of systems  Constitutional: + decreasing body weight,  current Body mass index is 41.49 kg/m. , no fatigue, no subjective hyperthermia, no subjective hypothermia Eyes: no blurry vision, no xerophthalmia ENT: no sore throat, no nodules palpated in throat, no dysphagia/odynophagia, no hoarseness Cardiovascular: no chest pain, no shortness of breath, no palpitations, no leg swelling Respiratory: no cough, no shortness of breath Gastrointestinal: no nausea/vomiting/diarrhea Musculoskeletal: + chronic diffuse muscle/joint aches (now seeing pain management specialist) Skin: no rashes, no hyperemia, left leg increased redness (more so when she wakes up in the morning) hot to touch Neurological: no tremors, no numbness, no tingling, no dizziness Psychiatric: no depression, no anxiety    Objective:    BP 132/88 (BP Location: Left Arm, Patient Position: Sitting, Cuff Size: Large)   Pulse 80   Ht 5' 1 (1.549 m)   Wt 219 lb 9.6 oz (99.6 kg)   BMI 41.49 kg/m   Wt Readings from Last 3 Encounters:  05/23/24 219 lb 9.6 oz (99.6 kg)  03/13/24 223 lb 3.2 oz (101.2 kg)  01/12/24 223 lb (101.2 kg)    BP Readings from Last 3 Encounters:  05/23/24 132/88  03/13/24 115/75  01/12/24 102/80     Physical Exam- Limited  Constitutional:  Body mass index is 41.49 kg/m. , not in acute distress,  normal state of mind Eyes:  EOMI, no exophthalmos Musculoskeletal: no gross deformities, strength intact in all four extremities, no gross restriction of joint movements Skin:  no rashes, no hyperemia, left lower extremity edema and slight redness Neurological: no tremor with outstretched hands   Diabetic Foot Exam - Simple   Simple Foot Form Diabetic Foot exam was performed with the following findings: Yes 05/23/2024  2:11 PM  Visual Inspection No deformities, no ulcerations, no other skin breakdown bilaterally: Yes Sensation Testing Intact to touch and monofilament testing bilaterally: Yes Pulse Check Posterior Tibialis and Dorsalis pulse intact bilaterally: Yes Comments    CMP ( most recent) CMP     Component Value Date/Time   NA 135 05/27/2021 1345   K 4.6 05/27/2021 1345   CL 98 05/27/2021 1345   CO2 22 05/27/2021 1345   GLUCOSE 144 (H) 05/27/2021 1345   GLUCOSE 185 (H) 07/27/2017 0916   BUN 15 05/27/2021 1345   CREATININE 0.89 05/27/2021 1345   CALCIUM  9.2 05/27/2021 1345   PROT 7.2 05/27/2021 1345   ALBUMIN 4.3 05/27/2021 1345   AST 18 05/27/2021 1345   ALT 9 05/27/2021 1345   ALKPHOS 81 05/27/2021 1345   BILITOT 0.6 05/27/2021 1345   GFRNONAA 75 09/25/2020 0000   GFRAA 87 09/25/2020 0000    Lipid Panel     Component Value Date/Time   CHOL 110 09/25/2020 0000   CHOL 104 05/16/2020 1002   TRIG 101 10/27/2021 0000   HDL 47 09/25/2020 0000   HDL 47 05/16/2020 1002   LDLCALC 17 10/27/2021 0000  LDLCALC 37 05/16/2020 1002   LABVLDL 20 05/16/2020 1002      Assessment & Plan:   1) Controlled type 2 diabetes mellitus without complication  - CARLTON SWEANEY has currently uncontrolled symptomatic type 2 DM since 63 years of age.  She presents today with her CGM showing improving, mostly at goal glycemic profile overall.  Her most recent A1c on 9/24 was 7.8%, increasing slightly from last visit of 7.3%.  Analysis of her CGM shows TIR 58%, TAR 42%, TBR 0%  with a GMI of 7.5%.  She denies any hypoglycemia.  She has recently had an illness, thought it was COVID but tested negative.  -her diabetes is complicated by obesity/sedentary life and she remains at a high risk for more acute and chronic complications which include CAD, CVA, CKD, retinopathy, and neuropathy. These are all discussed in detail with her.  - Nutritional counseling repeated at each appointment due to patients tendency to fall back in to old habits.  - The patient admits there is a room for improvement in their diet and drink choices. -  Suggestion is made for the patient to avoid simple carbohydrates from their diet including Cakes, Sweet Desserts / Pastries, Ice Cream, Soda (diet and regular), Sweet Tea, Candies, Chips, Cookies, Sweet Pastries, Store Bought Juices, Alcohol in Excess of 1-2 drinks a day, Artificial Sweeteners, Coffee Creamer, and Sugar-free Products. This will help patient to have stable blood glucose profile and potentially avoid unintended weight gain.   - I encouraged the patient to switch to unprocessed or minimally processed complex starch and increased protein intake (animal or plant source), fruits, and vegetables.   - Patient is advised to stick to a routine mealtimes to eat 3 meals a day and avoid unnecessary snacks (to snack only to correct hypoglycemia).  - I have approached her with the following individualized plan to manage diabetes and patient agrees:   -Based on her presenting glycemic profile, she will not need prandial insulin  for now.    - She is advised to continue Tresiba  55 units SQ nightly, Ozempic 2 mg SQ weekly, and Glipizide  5 mg XL daily at breakfast.  We did try stopping Glipizide  in the past and her glucose rebounded.  -She did not tolerate Metformin  in the past, even in the ER form.    -She is encouraged to consistently monitor glucose twice daily (using her CGM), before breakfast and before bed and call the clinic if she has  readings less than 70 or greater than 200 for 3 tests in a row.  She is benefiting from her CGM, is advised to continue using it.    - Patient specific target  A1c;  LDL, HDL, Triglycerides,  were discussed in detail.  2) Blood Pressure /Hypertension: Her blood pressure is controlled to target.  She is advised to continue meds as prescribed by PCP.  3) Lipids/Hyperlipidemia:   Her most recent lipid panel from 05/17/24 shows controlled LDL of 36.  She is advised to continue Crestor  10 mg po daily before bed.  Side effects and precautions discussed with her.   4)  Weight/Diet:  Her Body mass index is 41.49 kg/m.-   clearly complicating her diabetes care.  She is a candidate for modest weight loss.  I discussed with her the fact that loss of 5 - 10% of her  current body weight will have the most impact on her diabetes management.  CDE Consult will be initiated . Exercise, and detailed carbohydrates information provided  -  detailed on discharge instructions.  5) Chronic Care/Health Maintenance: -she on ARB and Statin medication and is encouraged to initiate and continue to follow up with Ophthalmology, Dentist, Podiatrist at least yearly or according to recommendations, and advised to stay away from smoking. I have recommended yearly flu vaccine and pneumonia vaccine at least every 5 years; moderate intensity exercise for up to 150 minutes weekly; and  sleep for at least 7 hours a day.  - she is advised to maintain close follow up with Shona Norleen PEDLAR, MD for primary care needs, as well as her other providers for optimal and coordinated care.  She is interested in re-establishing care with podiatry.  She notes she will call and schedule appt.     I spent  45  minutes in the care of the patient today including review of labs from CMP, Lipids, Thyroid  Function, Hematology (current and previous including abstractions from other facilities); face-to-face time discussing  her blood glucose readings/logs,  discussing hypoglycemia and hyperglycemia episodes and symptoms, medications doses, her options of short and long term treatment based on the latest standards of care / guidelines;  discussion about incorporating lifestyle medicine;  and documenting the encounter. Risk reduction counseling performed per USPSTF guidelines to reduce obesity and cardiovascular risk factors.     Please refer to Patient Instructions for Blood Glucose Monitoring and Insulin /Medications Dosing Guide  in media tab for additional information. Please  also refer to  Patient Self Inventory in the Media  tab for reviewed elements of pertinent patient history.  Erica Price participated in the discussions, expressed understanding, and voiced agreement with the above plans.  All questions were answered to her satisfaction. she is encouraged to contact clinic should she have any questions or concerns prior to her return visit.    Follow up plan: - Return in about 4 months (around 09/22/2024) for Diabetes F/U with A1c in office, No previsit labs, Bring meter and logs.   Benton Rio, [facility] [address] [place], [place] [address] Phone: [phone] Fax: [phone]  05/23/2024, 3:23 PM

## 2024-06-12 ENCOUNTER — Telehealth: Payer: Self-pay | Admitting: Nurse Practitioner

## 2024-06-12 NOTE — Telephone Encounter (Signed)
 Let pt know PAP of Tresiba  was ready for pick up

## 2024-06-13 ENCOUNTER — Ambulatory Visit: Attending: Cardiology | Admitting: Cardiology

## 2024-06-13 ENCOUNTER — Encounter: Payer: Self-pay | Admitting: Cardiology

## 2024-06-13 VITALS — BP 110/72 | HR 84 | Ht 62.0 in | Wt 220.0 lb

## 2024-06-13 DIAGNOSIS — R002 Palpitations: Secondary | ICD-10-CM | POA: Diagnosis not present

## 2024-06-13 NOTE — Telephone Encounter (Signed)
 Pt picked up PAP of Tresiba 

## 2024-06-13 NOTE — Patient Instructions (Signed)
 Medication Instructions:   Continue all current medications.   Labwork:  none  Testing/Procedures:  Your physician has recommended that you wear a 14 day event monitor. Event monitors are medical devices that record the heart's electrical activity. Doctors most often us  these monitors to diagnose arrhythmias. Arrhythmias are problems with the speed or rhythm of the heartbeat. The monitor is a small, portable device. You can wear one while you do your normal daily activities. This is usually used to diagnose what is causing palpitations/syncope (passing out). Office will contact with results via phone, letter or mychart.     Follow-Up:  4-6 weeks    Any Other Special Instructions Will Be Listed Below (If Applicable).   If you need a refill on your cardiac medications before your next appointment, please call your pharmacy.

## 2024-06-13 NOTE — Progress Notes (Signed)
 Clinical Summary Erica Price is a 63 y.o.female seen today as a new consult, referred by Dr Marvine for the following medical problems.  1.Palpitations - symptoms ongoing for last several years, roughly started 2021. Has progressed in frequency - irregular feeling of heart beat, can feel pounding, associated SOB. Can occur at rest or with activity. Lasts a few minutes, occurs few times a week.  - coffee x 1, no sodas, no tea, no energy drinks, no EtoH   - EKG today shows SR, PACs  Past Medical History:  Diagnosis Date   Apnea    Poss OSA   Arthralgia    Arthritis    Depression    Diabetes mellitus without complication (HCC)    Fatigue    Fibromyalgia    GERD (gastroesophageal reflux disease)    Hypertension    IBS (irritable bowel syndrome)    Myalgia    Pinched nerve    lumbar-l5-s1   PONV (postoperative nausea and vomiting)    Snoring      No Known Allergies   Current Outpatient Medications  Medication Sig Dispense Refill   albuterol  (PROVENTIL  HFA;VENTOLIN  HFA) 108 (90 Base) MCG/ACT inhaler Inhale 2 puffs into the lungs every 6 (six) hours as needed for wheezing or shortness of breath.     Alpha-D-Galactosidase (BEANO PO) Take by mouth.     amitriptyline (ELAVIL) 10 MG tablet Take 10 mg by mouth at bedtime.     Azelastine HCl 137 MCG/SPRAY SOLN Place 2 sprays into both nostrils 2 (two) times daily.     Budeson-Glycopyrrol-Formoterol (BREZTRI AEROSPHERE IN) Inhale 2 puffs into the lungs 2 (two) times daily.     Calcium  Carbonate-Vit D-Min (CALCIUM  1200 PO) Take by mouth.     celecoxib (CELEBREX) 200 MG capsule Take 200 mg by mouth 2 (two) times daily. (Patient taking differently: Take 200 mg by mouth 2 (two) times daily as needed.)     Continuous Glucose Sensor (FREESTYLE LIBRE 3 PLUS SENSOR) MISC Change sensor every 15 days. 6 each 3   Cyanocobalamin (VITAMIN B-12 IJ) Inject 1,000 mcg as directed every 30 (thirty) days.     DULoxetine (CYMBALTA) 60 MG  capsule Take 60 mg daily by mouth.      fluticasone (FLONASE) 50 MCG/ACT nasal spray INSTILL TWO (2) SPRAYS IN EACH NOSTRIL DAILY (Patient not taking: Reported on 05/23/2024) 16 g 11   furosemide (LASIX) 20 MG tablet Take 20 mg by mouth daily as needed for fluid.     gabapentin (NEURONTIN) 300 MG capsule Take 600 mg by mouth 3 (three) times daily.     glipiZIDE  (GLUCOTROL  XL) 5 MG 24 hr tablet TAKE 1 TABLET BY MOUTH ONCE DAILY WITH BREAKFAST 90 tablet 11   HYDROcodone -acetaminophen  (NORCO) 7.5-325 MG tablet Take 1 tablet by mouth 2 (two) times daily as needed.     lidocaine  (LIDODERM ) 5 % 1 patch daily. (Patient taking differently: 1 patch daily as needed.)     losartan  (COZAAR ) 25 MG tablet TAKE ONE TABLET BY MOUTH ONCE DAILY 90 tablet 0   Omega-3 Fatty Acids (OMEGA-3 FISH OIL PO) Take by mouth.     pantoprazole  (PROTONIX ) 40 MG tablet TAKE 1 TABLET BY MOUTH 1 TO 2  TIMES DAILY BEFORE MEALS 200 tablet 3   rOPINIRole (REQUIP) 0.5 MG tablet Take 1 mg by mouth at bedtime.     rosuvastatin  (CRESTOR ) 10 MG tablet Take 1 tablet (10 mg total) by mouth daily. 90 tablet 3  Semaglutide, 1 MG/DOSE, (OZEMPIC, 1 MG/DOSE,) 2 MG/1.5ML SOPN Inject 2 mg into the skin once a week.     simethicone  (MYLICON) 125 MG chewable tablet Chew 125 mg by mouth every 6 (six) hours as needed for flatulence.     tiZANidine (ZANAFLEX) 2 MG tablet Take 2 mg by mouth daily as needed.     TRESIBA  FLEXTOUCH 100 UNIT/ML FlexTouch Pen INJECT 55 UNITS SUBCUTANEOUSLY AT BEDTIME (Patient taking differently: Inject 55 Units into the skin at bedtime.) 15 mL 10   VITAMIN D -VITAMIN K PO Take by mouth.     zolpidem (AMBIEN) 10 MG tablet Take 10 mg by mouth at bedtime.     No current facility-administered medications for this visit.     Past Surgical History:  Procedure Laterality Date   ARTHROSCOPIC REPAIR ACL Left    BALLOON DILATION N/A 06/05/2021   Procedure: BALLOON DILATION;  Surgeon: Cindie Carlin POUR, DO;  [facility];  Service: Endoscopy;  Laterality: N/A;   BIOPSY N/A 06/12/2014   Procedure: BIOPSY;  Surgeon: Margo LITTIE Haddock, MD;  [facility]: [facility];  Service: Endoscopy;  Laterality: N/A;   BIOPSY  09/14/2017   Procedure: BIOPSY;  Surgeon: Haddock Margo LITTIE, MD;  [facility];  Service: Endoscopy;;  colon   BIOPSY  06/05/2021   Procedure: BIOPSY;  Surgeon: Cindie Carlin POUR, DO;  [facility];  Service: Endoscopy;;   CARPAL TUNNEL RELEASE Right    CHOLECYSTECTOMY  1990s   CLUB FOOT RELEASE Left    x3   COLONOSCOPY WITH PROPOFOL  N/A 06/12/2014   Dr. Gwendalyn colon polyps removed/moderate divertiiculosis/small internal hemorrhoids/moderate sized external hemorrhoids. tubular adenomas. Next surveillance in Oct 2018.    COLONOSCOPY WITH PROPOFOL  N/A 09/14/2017   Dr. Haddock: simple adenomas, moderate diverticulosis in rectosigmoid colon, sigmoid colon, and descending colon. External and internal hemorrhoids, normal colon biopsies. simple adenomas. Needs surveillance in 3 years with Propofol , colowrap   COLONOSCOPY WITH PROPOFOL  N/A 06/05/2021   Procedure: COLONOSCOPY WITH PROPOFOL ;  Surgeon: Cindie Carlin POUR, DO;  [facility];  Service: Endoscopy;  Laterality: N/A;  7:30am   ESOPHAGOGASTRODUODENOSCOPY (EGD) WITH PROPOFOL  N/A 06/12/2014   Dr. 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 benign gastric and duodenal biopsies   ESOPHAGOGASTRODUODENOSCOPY (EGD) WITH PROPOFOL  N/A 06/05/2021   Procedure: ESOPHAGOGASTRODUODENOSCOPY (EGD) WITH PROPOFOL ;  Surgeon: Cindie Carlin POUR, DO;  [facility];  Service: Endoscopy;  Laterality: N/A;   POLYPECTOMY N/A 06/12/2014   Procedure: POLYPECTOMY;  Surgeon: Margo LITTIE Haddock, MD;  [facility];  Service: Endoscopy;  Laterality: N/A;   POLYPECTOMY  09/14/2017   Procedure: POLYPECTOMY;  Surgeon: Haddock Margo LITTIE, MD;  [facility];  Service: Endoscopy;;  colon   POLYPECTOMY   06/05/2021   Procedure: POLYPECTOMY;  Surgeon: Cindie Carlin POUR, DO;  [facility];  Service: Endoscopy;;   SAVORY DILATION N/A 06/12/2014   Procedure: SAVORY DILATION;  Surgeon: Margo LITTIE Haddock, MD;  [facility];  Service: Endoscopy;  Laterality: N/A;  12.8-17     No Known Allergies    Family History  Problem Relation Age of Onset   Heart failure Father        Died, 37   Hypertension Mother        Living, 78   Breast cancer Mother    Kidney cancer Mother    Hypercholesterolemia Son    Breast cancer Sister    Pancreatic cancer Brother  passed age 61   Hypertension Sister    Colon cancer Neg Hx      Social History Erica Price reports that she quit smoking about 12 years ago. Her smoking use included cigarettes. She started smoking about 32 years ago. She has a 10 pack-year smoking history. She has never used smokeless tobacco. Erica Price reports no history of alcohol use.     Physical Examination Today's Vitals   06/13/24 0848  BP: 110/72  Pulse: 84  SpO2: 95%  Weight: 220 lb (99.8 kg)  Height: 5' 2 (1.575 m)   Body mass index is 40.24 kg/m.  Gen: resting comfortably, no acute distress HEENT: no scleral icterus, pupils equal round and reactive, no palptable cervical adenopathy,  CV: RRR, no m/rg, no jvd Resp: Clear to auscultation bilaterally GI: abdomen is soft, non-tender, non-distended, normal bowel sounds, no hepatosplenomegaly MSK: extremities are warm, no edema.  Skin: warm, no rash Neuro:  no focal deficits Psych: appropriate affect     Assessment and Plan  1.Palpitations - EKG today shows SR, rare PACs - will plan for 2 week zio patch to further evaluate      Dorn PHEBE Ross, M.D.

## 2024-06-15 ENCOUNTER — Ambulatory Visit: Attending: Cardiology

## 2024-06-15 ENCOUNTER — Other Ambulatory Visit: Payer: Self-pay | Admitting: Cardiology

## 2024-06-15 DIAGNOSIS — R002 Palpitations: Secondary | ICD-10-CM

## 2024-07-10 ENCOUNTER — Other Ambulatory Visit (HOSPITAL_COMMUNITY): Payer: Self-pay

## 2024-07-10 DIAGNOSIS — Z1231 Encounter for screening mammogram for malignant neoplasm of breast: Secondary | ICD-10-CM

## 2024-07-12 ENCOUNTER — Other Ambulatory Visit: Payer: Self-pay | Admitting: Nurse Practitioner

## 2024-07-17 ENCOUNTER — Telehealth: Payer: Self-pay | Admitting: Nurse Practitioner

## 2024-07-17 NOTE — Telephone Encounter (Signed)
 Let know PAP of ozempic is ready for pick up

## 2024-07-24 NOTE — Telephone Encounter (Signed)
 Pt picked up PAP of ozempic 

## 2024-07-27 ENCOUNTER — Encounter: Payer: Self-pay | Admitting: Nurse Practitioner

## 2024-07-27 ENCOUNTER — Ambulatory Visit: Attending: Nurse Practitioner | Admitting: Nurse Practitioner

## 2024-07-27 VITALS — BP 104/76 | HR 78 | Ht 62.0 in | Wt 219.2 lb

## 2024-07-27 DIAGNOSIS — R0602 Shortness of breath: Secondary | ICD-10-CM

## 2024-07-27 DIAGNOSIS — I10 Essential (primary) hypertension: Secondary | ICD-10-CM | POA: Diagnosis not present

## 2024-07-27 DIAGNOSIS — R002 Palpitations: Secondary | ICD-10-CM

## 2024-07-27 NOTE — Patient Instructions (Addendum)
 Medication Instructions:  Your physician recommends that you continue on your current medications as directed. Please refer to the Current Medication list given to you today.   Labwork: None  Testing/Procedures: Your physician has requested that you have an echocardiogram. Echocardiography is a painless test that uses sound waves to create images of your heart. It provides your doctor with information about the size and shape of your heart and how well your heart's chambers and valves are working. This procedure takes approximately one hour. There are no restrictions for this procedure. Please do NOT wear cologne, perfume, aftershave, or lotions (deodorant is allowed). Please arrive 15 minutes prior to your appointment time.  Please note: We ask at that you not bring children with you during ultrasound (echo/ vascular) testing. Due to room size and safety concerns, children are not allowed in the ultrasound rooms during exams. Our front office staff cannot provide observation of children in our lobby area while testing is being conducted. An adult accompanying a patient to their appointment will only be allowed in the ultrasound room at the discretion of the ultrasound technician under special circumstances. We apologize for any inconvenience.   Follow-Up: Your physician recommends that you schedule a follow-up appointment in: 6 weeks  Any Other Special Instructions Will Be Listed Below (If Applicable). Thank you for choosing [facility]!     If you need a refill on your cardiac medications before your next appointment, please call your pharmacy.

## 2024-07-27 NOTE — Progress Notes (Unsigned)
 Cardiology Office Note   Date:  07/27/2024 ID:  Korena, Nass June 24, 1961, MRN [id number] PCP: Shona Norleen PEDLAR, MD  [facility] Providers Cardiologist:  Alvan Carrier, MD     History of Present Illness Erica Price is a 63 y.o. female with a PMH of palpitations, hypertension, T2DM, fibromyalgia, GERD, who presents today for scheduled follow-up appointment.  Last seen by Dr. Alvan on June 13, 2024.  She had been evaluated for palpitations.  These were noted to be ongoing for several years, had progressed in frequency, occurring with rest or with activity.  Symptoms only lasting a few minutes and occurring a few times per week.  ZIO monitor was arranged. See full preliminary monitor report noted below.  Today she is here for follow-up.  She states her symptoms are stable since last office visit. Notices palpitations and associated shortness of breath, typically noted since May or June of this year. Wants to know the results of her recent monitor worn. Denies any chest pain, syncope, presyncope, dizziness, orthopnea, PND, swelling or significant weight changes, acute bleeding, or claudication.  ROS: Negative. See HPI.   Studies Reviewed  EKG: EKG is not ordered today.   Echo 06/2024:  Patch Wear Time:  13 days and 17 hours (2025-10-29T16:48:38-0400 to 2025-11-12T10:23:30-0500)   Patient had a min HR of 57 bpm, max HR of 197 bpm, and avg HR of 76 bpm. Predominant underlying rhythm was Sinus Rhythm. 1133 Supraventricular Tachycardia runs occurred, the run with the fastest interval lasting 4 beats with a max rate of 197 bpm, the  longest lasting 10.1 secs with an avg rate of 125 bpm. Supraventricular Tachycardia was detected within +/- 45 seconds of symptomatic patient event(s). Isolated SVEs were frequent (22.2%, [id number]), SVE Couplets were occasional (3.0%, [id number]), and SVE  Triplets were occasional (2.6%, [id number]). No Isolated VEs, VE Couplets, or VE Triplets were present.    Physical Exam VS:  BP 104/76 (BP Location: Left Arm, Cuff Size: Large)   Pulse 78   Ht 5' 2 (1.575 m)   Wt 219 lb 3.2 oz (99.4 kg)   SpO2 99%   BMI 40.09 kg/m        Wt Readings from Last 3 Encounters:  07/27/24 219 lb 3.2 oz (99.4 kg)  06/13/24 220 lb (99.8 kg)  05/23/24 219 lb 9.6 oz (99.6 kg)    GEN: Morbidly obese, 63 y.o. female in no acute distress NECK: No JVD; No carotid bruits CARDIAC: S1/S2, RRR, no murmurs, rubs, gallops RESPIRATORY:  Clear to auscultation without rales, wheezing or rhonchi  ABDOMEN: Soft, non-tender, non-distended EXTREMITIES:  No edema; No deformity   ASSESSMENT AND PLAN  Palpations with associated shortness of breath Continues to note stable symptoms. Reviewed preliminary report with her, some episodes of PSVT and early beats, believe this is contributing to her symptoms. Will arrange Echo and if benign, plan to be low dose beta blocker to relieve her symptoms if not improved by next visit. Heart healthy diet and regular cardiovascular exercise encouraged.   HTN BP is stable. Discussed to monitor BP at home at least 2 hours after medications and sitting for 5-10 minutes. No medication changes at this time. Heart healthy diet and regular cardiovascular exercise encouraged.   Morbid obesity   Weight loss via diet and exercise encouraged. Discussed the impact being overweight would have on cardiovascular risk.   Dispo: Follow-up with MD/[facility] in 6 weeks or sooner if anything changes.   Signed, Almarie Crate, NP

## 2024-08-07 ENCOUNTER — Ambulatory Visit (HOSPITAL_COMMUNITY): Admission: RE | Admit: 2024-08-07 | Discharge: 2024-08-07 | Disposition: A | Source: Ambulatory Visit

## 2024-08-07 DIAGNOSIS — Z1231 Encounter for screening mammogram for malignant neoplasm of breast: Secondary | ICD-10-CM

## 2024-08-08 ENCOUNTER — Ambulatory Visit

## 2024-08-08 DIAGNOSIS — R0602 Shortness of breath: Secondary | ICD-10-CM

## 2024-08-08 LAB — ECHOCARDIOGRAM COMPLETE
AR max vel: 2.79 cm2
AV Peak grad: 6.3 mmHg
Ao pk vel: 1.25 m/s
Area-P 1/2: 2.91 cm2
Calc EF: 66.9 %
S' Lateral: 2.2 cm
Single Plane A2C EF: 63.5 %
Single Plane A4C EF: 71.1 %

## 2024-08-15 ENCOUNTER — Ambulatory Visit: Admitting: Nurse Practitioner

## 2024-08-15 ENCOUNTER — Ambulatory Visit: Payer: Self-pay | Admitting: Nurse Practitioner

## 2024-08-21 MED ORDER — LOSARTAN POTASSIUM 25 MG PO TABS: 12.5000 mg | ORAL_TABLET | Freq: Every day | ORAL | 2 refills | Status: AC

## 2024-08-21 MED ORDER — METOPROLOL SUCCINATE ER 25 MG PO TB24: 12.5000 mg | ORAL_TABLET | Freq: Every day | ORAL | 0 refills | Status: DC

## 2024-08-21 NOTE — Telephone Encounter (Signed)
 Per Almarie response to MyChart message: I appreciate the update.  Please reduce losartan  to 12.5 mg daily and please begin metoprolol succinate 12.5 mg daily for palpitations.  Let's bring her back in 1 week for an EKG visit and she can monitor and log her heart rate and blood pressure readings and bring this to this nurse visit.   Thanks!   Best, Almarie Crate, NP  Patient has been informed and Nurse visit has been scheduled. Patient verbalized understanding

## 2024-08-22 ENCOUNTER — Other Ambulatory Visit: Payer: Self-pay

## 2024-08-23 MED ORDER — METOPROLOL SUCCINATE ER 25 MG PO TB24: 12.5000 mg | ORAL_TABLET | Freq: Every day | ORAL | 3 refills | Status: AC

## 2024-08-31 ENCOUNTER — Telehealth: Payer: Self-pay

## 2024-08-31 ENCOUNTER — Ambulatory Visit: Attending: Internal Medicine

## 2024-08-31 DIAGNOSIS — R002 Palpitations: Secondary | ICD-10-CM

## 2024-08-31 NOTE — Telephone Encounter (Signed)
 The patient has been notified of the result and verbalized understanding.  All questions (if any) were answered. Littie CHRISTELLA Croak, CMA 08/31/2024 3:48 PM

## 2024-08-31 NOTE — Telephone Encounter (Signed)
-----   Message from Almarie Crate, NP sent at 08/31/2024  1:13 PM EST ----- EKG looks very good. Continue same treatment plan and follow-up as scheduled.   Thanks!  Best, Almarie Crate, NP

## 2024-08-31 NOTE — Progress Notes (Signed)
 Patient here for EKG, Reported no symptoms and she stated that she has had her medications as well. Advised her that will route to [facility] for review

## 2024-09-13 DIAGNOSIS — R002 Palpitations: Secondary | ICD-10-CM | POA: Diagnosis not present

## 2024-09-14 ENCOUNTER — Other Ambulatory Visit: Payer: Self-pay | Admitting: Nurse Practitioner

## 2024-09-26 ENCOUNTER — Ambulatory Visit: Admitting: Nurse Practitioner

## 2024-09-26 DIAGNOSIS — Z794 Long term (current) use of insulin: Secondary | ICD-10-CM

## 2024-09-26 DIAGNOSIS — E1165 Type 2 diabetes mellitus with hyperglycemia: Secondary | ICD-10-CM

## 2024-09-26 DIAGNOSIS — E782 Mixed hyperlipidemia: Secondary | ICD-10-CM

## 2024-09-26 DIAGNOSIS — Z7985 Long-term (current) use of injectable non-insulin antidiabetic drugs: Secondary | ICD-10-CM

## 2024-09-26 DIAGNOSIS — Z7984 Long term (current) use of oral hypoglycemic drugs: Secondary | ICD-10-CM

## 2024-09-27 ENCOUNTER — Ambulatory Visit: Admitting: Cardiology

## 2024-09-27 ENCOUNTER — Encounter: Payer: Self-pay | Admitting: Cardiology

## 2024-09-27 VITALS — BP 106/78 | HR 82 | Ht 62.0 in | Wt 216.6 lb

## 2024-09-27 DIAGNOSIS — I471 Supraventricular tachycardia, unspecified: Secondary | ICD-10-CM | POA: Diagnosis not present

## 2024-09-27 DIAGNOSIS — I491 Atrial premature depolarization: Secondary | ICD-10-CM | POA: Diagnosis not present

## 2024-09-27 DIAGNOSIS — R002 Palpitations: Secondary | ICD-10-CM

## 2024-09-27 NOTE — Progress Notes (Signed)
 "     Clinical Summary Erica Price is a 64 y.o.female seen today for follow up of the following medical problems.   1.Palpitations - symptoms ongoing for last several years, roughly started 2021. Has progressed in frequency - irregular feeling of heart beat, can feel pounding, associated SOB. Can occur at rest or with activity. Lasts a few minutes, occurs few times a week.  - coffee x 1, no sodas, no tea, no energy drinks, no EtoH     - EKG today shows SR, PACs   06/2024 14 day monitor: frequent PACs (22.2%), 1133 runs of SVT, longest 10 seconds. No ventricular ectopy. Symptoms correlated with PACs, SVT  07/2024 echo: LVEF 60-65%, no WMAs, grade I dd,  - started toprol  12.5mg  daily.  - symptoms improved on the medications. Mild symptoms with high levels of activity. Overall symptoms 75% improved. Overall symptoms are tolerable.    2.DM2 - on losartan  just for renal protection, not for HTN   Past Medical History:  Diagnosis Date   Apnea    Poss OSA   Arthralgia    Arthritis    Depression    Diabetes mellitus without complication (HCC)    Fatigue    Fibromyalgia    GERD (gastroesophageal reflux disease)    Hypertension    IBS (irritable bowel syndrome)    Myalgia    Pinched nerve    lumbar-l5-s1   PONV (postoperative nausea and vomiting)    Snoring      Allergies[1]   Current Outpatient Medications  Medication Sig Dispense Refill   albuterol  (PROVENTIL  HFA;VENTOLIN  HFA) 108 (90 Base) MCG/ACT inhaler Inhale 2 puffs into the lungs every 6 (six) hours as needed for wheezing or shortness of breath.     Alpha-D-Galactosidase (BEANO PO) Take by mouth.     amitriptyline (ELAVIL) 10 MG tablet Take 10 mg by mouth at bedtime.     Azelastine HCl 137 MCG/SPRAY SOLN Place 2 sprays into both nostrils 2 (two) times daily.     Budeson-Glycopyrrol-Formoterol (BREZTRI AEROSPHERE IN) Inhale 2 puffs into the lungs 2 (two) times daily.     Calcium  Carbonate-Vit D-Min (CALCIUM  1200  PO) Take by mouth.     celecoxib (CELEBREX) 200 MG capsule Take 200 mg by mouth 2 (two) times daily. (Patient taking differently: Take 200 mg by mouth 2 (two) times daily as needed for mild pain (pain score 1-3).)     Continuous Glucose Sensor (FREESTYLE LIBRE 3 PLUS SENSOR) MISC CHANGE SENSOR EVERY 15 DAYS 7 each 2   Cyanocobalamin (VITAMIN B-12 IJ) Inject 1,000 mcg as directed every 30 (thirty) days.     DULoxetine (CYMBALTA) 60 MG capsule Take 60 mg daily by mouth.      fluticasone (FLONASE) 50 MCG/ACT nasal spray INSTILL TWO (2) SPRAYS IN EACH NOSTRIL DAILY (Patient not taking: Reported on 07/27/2024) 16 g 11   furosemide (LASIX) 20 MG tablet Take 20 mg by mouth daily as needed for fluid.     gabapentin (NEURONTIN) 300 MG capsule Take 600 mg by mouth 3 (three) times daily.     glipiZIDE  (GLUCOTROL  XL) 5 MG 24 hr tablet TAKE 1 TABLET BY MOUTH DAILY  WITH BREAKFAST 30 tablet 0   HYDROcodone -acetaminophen  (NORCO) 7.5-325 MG tablet Take 1 tablet by mouth 2 (two) times daily as needed.     lidocaine  (LIDODERM ) 5 % 1 patch daily.     losartan  (COZAAR ) 25 MG tablet Take 0.5 tablets (12.5 mg total) by mouth daily. 45 tablet 2  metoprolol  succinate (TOPROL  XL) 25 MG 24 hr tablet Take 0.5 tablets (12.5 mg total) by mouth daily. 45 tablet 3   Omega-3 Fatty Acids (OMEGA-3 FISH OIL PO) Take by mouth.     pantoprazole  (PROTONIX ) 40 MG tablet TAKE 1 TABLET BY MOUTH 1 TO 2  TIMES DAILY BEFORE MEALS 200 tablet 3   rOPINIRole (REQUIP) 0.5 MG tablet Take 1 mg by mouth at bedtime.     rosuvastatin  (CRESTOR ) 10 MG tablet Take 1 tablet (10 mg total) by mouth daily. 90 tablet 3   Semaglutide, 1 MG/DOSE, (OZEMPIC, 1 MG/DOSE,) 2 MG/1.5ML SOPN Inject 2 mg into the skin once a week.     simethicone  (MYLICON) 125 MG chewable tablet Chew 125 mg by mouth every 6 (six) hours as needed for flatulence.     tiZANidine (ZANAFLEX) 2 MG tablet Take 2 mg by mouth daily as needed.     TRESIBA  FLEXTOUCH 100 UNIT/ML FlexTouch Pen  INJECT 55 UNITS SUBCUTANEOUSLY AT BEDTIME (Patient taking differently: Inject 55 Units into the skin at bedtime.) 15 mL 10   VITAMIN D -VITAMIN K PO Take by mouth.     zolpidem (AMBIEN) 10 MG tablet Take 10 mg by mouth at bedtime.     No current facility-administered medications for this visit.     Past Surgical History:  Procedure Laterality Date   ARTHROSCOPIC REPAIR ACL Left    BALLOON DILATION N/A 06/05/2021   Procedure: BALLOON DILATION;  Surgeon: Cindie Carlin POUR, DO;  [facility];  Service: Endoscopy;  Laterality: N/A;   BIOPSY N/A 06/12/2014   Procedure: BIOPSY;  Surgeon: Margo LITTIE Haddock, MD;  [facility];  Service: Endoscopy;  Laterality: N/A;   BIOPSY  09/14/2017   Procedure: BIOPSY;  Surgeon: Haddock Margo LITTIE, MD;  [facility];  Service: Endoscopy;;  colon   BIOPSY  06/05/2021   Procedure: BIOPSY;  Surgeon: Cindie Carlin POUR, DO;  [facility];  Service: Endoscopy;;   CARPAL TUNNEL RELEASE Right    CHOLECYSTECTOMY  1990s   CLUB FOOT RELEASE Left    x3   COLONOSCOPY WITH PROPOFOL  N/A 06/12/2014   Dr. Gwendalyn colon polyps removed/moderate divertiiculosis/small internal hemorrhoids/moderate sized external hemorrhoids. tubular adenomas. Next surveillance in Oct 2018.    COLONOSCOPY WITH PROPOFOL  N/A 09/14/2017   Dr. Haddock: simple adenomas, moderate diverticulosis in rectosigmoid colon, sigmoid colon, and descending colon. External and internal hemorrhoids, normal colon biopsies. simple adenomas. Needs surveillance in 3 years with Propofol , colowrap   COLONOSCOPY WITH PROPOFOL  N/A 06/05/2021   Procedure: COLONOSCOPY WITH PROPOFOL ;  Surgeon: Cindie Carlin POUR, DO;  [facility];  Service: Endoscopy;  Laterality: N/A;  7:30am   ESOPHAGOGASTRODUODENOSCOPY (EGD) WITH PROPOFOL  N/A 06/12/2014   Dr. 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 benign gastric and duodenal biopsies    ESOPHAGOGASTRODUODENOSCOPY (EGD) WITH PROPOFOL  N/A 06/05/2021   Procedure: ESOPHAGOGASTRODUODENOSCOPY (EGD) WITH PROPOFOL ;  Surgeon: Cindie Carlin POUR, DO;  [facility];  Service: Endoscopy;  Laterality: N/A;   POLYPECTOMY N/A 06/12/2014   Procedure: POLYPECTOMY;  Surgeon: Margo LITTIE Haddock, MD;  [facility];  Service: Endoscopy;  Laterality: N/A;   POLYPECTOMY  09/14/2017   Procedure: POLYPECTOMY;  Surgeon: Haddock Margo LITTIE, MD;  [facility];  Service: Endoscopy;;  colon   POLYPECTOMY  06/05/2021   Procedure: POLYPECTOMY;  Surgeon: Cindie Carlin POUR, DO;  [facility];  Service: Endoscopy;;   SAVORY DILATION N/A 06/12/2014  Procedure: SAVORY DILATION;  Surgeon: Margo LITTIE Haddock, MD;  [facility];  Service: Endoscopy;  Laterality: N/A;  12.8-17     Allergies[2]    Family History  Problem Relation Age of Onset   Heart failure Father        Died, 81   Hypertension Mother        Living, 71   Breast cancer Mother    Kidney cancer Mother    Hypercholesterolemia Son    Breast cancer Sister    Pancreatic cancer Brother        passed age 2   Hypertension Sister    Colon cancer Neg Hx      Social History Erica Price reports that she quit smoking about 12 years ago. Her smoking use included cigarettes. She started smoking about 32 years ago. She has a 10 pack-year smoking history. She has never used smokeless tobacco. Erica Price reports no history of alcohol use.     Physical Examination Today's Vitals   09/27/24 1055  BP: 106/78  Pulse: 82  SpO2: 96%  Weight: 216 lb 9.6 oz (98.2 kg)  Height: 5' 2 (1.575 m)   Body mass index is 39.62 kg/m.  Gen: resting comfortably, no acute distress HEENT: no scleral icterus, pupils equal round and reactive, no palptable cervical adenopathy,  CV: RRR, no mrg, no jvd Resp: Clear to auscultation bilaterally GI: abdomen is soft, non-tender, non-distended, normal bowel sounds, no  hepatosplenomegaly MSK: extremities are warm, no edema.  Skin: warm, no rash Neuro:  no focal deficits Psych: appropriate affect   Diagnostic Studies 06/2024 monitor 14 day monitor   Frequent supraventricular ectopy in the form of PACs (22.2% burden). Occasional couplets, triplets. 1133 runs of SVT, longest 10 seconds.   No ventricular ectopy   Symptoms correlated with sinus rhythm, PACs,SVT    Assessment and Plan   1.Palpitations/PACs/PSVT - symptoms overall controlled on low dose beta blocker, continue current dosing     Dorn PHEBE Ross, M.D.     [1] No Known Allergies [2] No Known Allergies  "

## 2024-09-27 NOTE — Patient Instructions (Signed)
 Medication Instructions:  Continue all current medications.   Labwork: none  Testing/Procedures: none  Follow-Up: 6 months   Any Other Special Instructions Will Be Listed Below (If Applicable).   If you need a refill on your cardiac medications before your next appointment, please call your pharmacy.

## 2024-09-29 ENCOUNTER — Ambulatory Visit: Admitting: Nurse Practitioner

## 2024-09-29 ENCOUNTER — Encounter: Payer: Self-pay | Admitting: Nurse Practitioner

## 2024-09-29 VITALS — BP 108/74 | HR 70 | Ht 62.0 in | Wt 217.0 lb

## 2024-09-29 DIAGNOSIS — Z794 Long term (current) use of insulin: Secondary | ICD-10-CM

## 2024-09-29 DIAGNOSIS — Z7984 Long term (current) use of oral hypoglycemic drugs: Secondary | ICD-10-CM

## 2024-09-29 DIAGNOSIS — Z7985 Long-term (current) use of injectable non-insulin antidiabetic drugs: Secondary | ICD-10-CM

## 2024-09-29 DIAGNOSIS — E1165 Type 2 diabetes mellitus with hyperglycemia: Secondary | ICD-10-CM

## 2024-09-29 DIAGNOSIS — E782 Mixed hyperlipidemia: Secondary | ICD-10-CM

## 2024-09-29 MED ORDER — GLIPIZIDE ER 5 MG PO TB24: 5.0000 mg | ORAL_TABLET | Freq: Every day | ORAL | 1 refills | Status: AC

## 2024-09-29 MED ORDER — TIRZEPATIDE 7.5 MG/0.5ML ~~LOC~~ SOAJ: 7.5000 mg | SUBCUTANEOUS | 1 refills | Status: AC

## 2024-09-29 NOTE — Progress Notes (Signed)
 "                                                                                 09/29/2024, 1:49 PM  Endocrinology follow-up note   Subjective:    Patient ID: Erica Price, female    DOB: 19-Feb-1961.  Erica Price is being seen  in follow-up for management of currently uncontrolled symptomatic diabetes requested by  Shona Norleen PEDLAR, MD.   Past Medical History:  Diagnosis Date   Apnea    Poss OSA   Arthralgia    Arthritis    Depression    Diabetes mellitus without complication (HCC)    Fatigue    Fibromyalgia    GERD (gastroesophageal reflux disease)    Hypertension    IBS (irritable bowel syndrome)    Myalgia    Pinched nerve    lumbar-l5-s1   PONV (postoperative nausea and vomiting)    Snoring     Past Surgical History:  Procedure Laterality Date   ARTHROSCOPIC REPAIR ACL Left    BALLOON DILATION N/A 06/05/2021   Procedure: BALLOON DILATION;  Surgeon: Cindie Carlin POUR, DO;  [facility];  Service: Endoscopy;  Laterality: N/A;   BIOPSY N/A 06/12/2014   Procedure: BIOPSY;  Surgeon: Margo LITTIE Haddock, MD;  Location: [facility];  Service: Endoscopy;  Laterality: N/A;   BIOPSY  09/14/2017   Procedure: BIOPSY;  Surgeon: Haddock Margo LITTIE, MD;  [facility];  Service: Endoscopy;;  colon   BIOPSY  06/05/2021   Procedure: BIOPSY;  Surgeon: Cindie Carlin POUR, DO;  [facility];  Service: Endoscopy;;   CARPAL TUNNEL RELEASE Right    CHOLECYSTECTOMY  1990s   CLUB FOOT RELEASE Left    x3   COLONOSCOPY WITH PROPOFOL  N/A 06/12/2014   Dr. Gwendalyn colon polyps removed/moderate divertiiculosis/small internal hemorrhoids/moderate sized external hemorrhoids. tubular adenomas. Next surveillance in Oct 2018.    COLONOSCOPY WITH PROPOFOL  N/A 09/14/2017   Dr. Haddock: simple adenomas, moderate diverticulosis in rectosigmoid colon, sigmoid colon, and descending colon. External and internal hemorrhoids, normal colon biopsies. simple adenomas. Needs surveillance in  3 years with Propofol , colowrap   COLONOSCOPY WITH PROPOFOL  N/A 06/05/2021   Procedure: COLONOSCOPY WITH PROPOFOL ;  Surgeon: Cindie Carlin POUR, DO;  [facility];  Service: Endoscopy;  Laterality: N/A;  7:30am   ESOPHAGOGASTRODUODENOSCOPY (EGD) WITH PROPOFOL  N/A 06/12/2014   Dr. 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 benign gastric and duodenal biopsies   ESOPHAGOGASTRODUODENOSCOPY (EGD) WITH PROPOFOL  N/A 06/05/2021   Procedure: ESOPHAGOGASTRODUODENOSCOPY (EGD) WITH PROPOFOL ;  Surgeon: Cindie Carlin POUR, DO;  [facility];  Service: Endoscopy;  Laterality: N/A;   POLYPECTOMY N/A 06/12/2014   Procedure: POLYPECTOMY;  Surgeon: Margo LITTIE Haddock, MD;  Location: [facility];  Service: Endoscopy;  Laterality: N/A;   POLYPECTOMY  09/14/2017   Procedure: POLYPECTOMY;  Surgeon: Haddock Margo LITTIE, MD;  [facility];  Service: Endoscopy;;  colon   POLYPECTOMY  06/05/2021   Procedure: POLYPECTOMY;  Surgeon: Cindie Carlin POUR, DO;  [facility];  Service: Endoscopy;;   SAVORY DILATION N/A 06/12/2014   Procedure: SAVORY DILATION;  Surgeon:  Margo LITTIE Haddock, MD;  Location: [facility];  Service: Endoscopy;  Laterality: N/A;  12.8-17    Social History   Socioeconomic History   Marital status: Married    Spouse name: Erica Price   Number of children: 2   Years of education: Not on file   Highest education level: Associate degree: occupational, scientist, product/process development, or vocational program  Occupational History   Occupation: disabled  Tobacco Use   Smoking status: Former    Current packs/day: 0.00    Average packs/day: 0.5 packs/day for 20.0 years (10.0 ttl pk-yrs)    Types: Cigarettes    Start date: 10/18/1991    Quit date: 10/18/2011    Years since quitting: 12.9   Smokeless tobacco: Never   Tobacco comments:    quit in 2014  Vaping Use   Vaping status: Never Used  Substance and Sexual Activity   Alcohol use: No   Drug use: No   Sexual  activity: Not Currently    Birth control/protection: Post-menopausal  Other Topics Concern   Not on file  Social History Narrative   She is currently unemployed.  She was terminated on 06/15/2013.  She was previously a architect, worked at [employer], and worked in designer, fashion/clothing for 25 years.   She lives with husband.  They have two children.   Social Drivers of Health   Tobacco Use: Medium Risk (09/29/2024)   Patient History    Smoking Tobacco Use: Former    Smokeless Tobacco Use: Never    Passive Exposure: Not on Actuary Strain: Not on file  Food Insecurity: Not on file  Transportation Needs: Not on file  Physical Activity: Not on file  Stress: Not on file  Social Connections: Not on file  Depression (EYV7-0): Not on file  Alcohol Screen: Not on file  Housing: Not on file  Utilities: Not on file  Health Literacy: Not on file    Family History  Problem Relation Age of Onset   Heart failure Father        Died, 86   Hypertension Mother        Living, 40   Breast cancer Mother    Kidney cancer Mother    Hypercholesterolemia Son    Breast cancer Sister    Pancreatic cancer Brother        passed age 67   Hypertension Sister    Colon cancer Neg Hx     Outpatient Encounter Medications as of 09/29/2024  Medication Sig   albuterol  (PROVENTIL  HFA;VENTOLIN  HFA) 108 (90 Base) MCG/ACT inhaler Inhale 2 puffs into the lungs every 6 (six) hours as needed for wheezing or shortness of breath.   Alpha-D-Galactosidase (BEANO PO) Take by mouth.   amitriptyline (ELAVIL) 10 MG tablet Take 10 mg by mouth at bedtime.   Azelastine HCl 137 MCG/SPRAY SOLN Place 2 sprays into both nostrils 2 (two) times daily.   Budeson-Glycopyrrol-Formoterol (BREZTRI AEROSPHERE IN) Inhale 2 puffs into the lungs 2 (two) times daily.   Calcium  Carbonate-Vit D-Min (CALCIUM  1200 PO) Take by mouth.   celecoxib (CELEBREX) 200 MG capsule Take 200 mg by mouth 2 (two) times daily.   Continuous Glucose  Sensor (FREESTYLE LIBRE 3 PLUS SENSOR) MISC CHANGE SENSOR EVERY 15 DAYS   Cyanocobalamin (VITAMIN B-12 IJ) Inject 1,000 mcg as directed every 30 (thirty) days.   DULoxetine (CYMBALTA) 60 MG capsule Take 60 mg daily by mouth.    furosemide (LASIX) 20 MG tablet Take 20 mg by mouth daily as needed  for fluid.   gabapentin (NEURONTIN) 300 MG capsule Take 600 mg by mouth 3 (three) times daily.   HYDROcodone -acetaminophen  (NORCO) 7.5-325 MG tablet Take 1 tablet by mouth 2 (two) times daily as needed.   lidocaine  (LIDODERM ) 5 % 1 patch daily.   losartan  (COZAAR ) 25 MG tablet Take 0.5 tablets (12.5 mg total) by mouth daily.   metoprolol  succinate (TOPROL  XL) 25 MG 24 hr tablet Take 0.5 tablets (12.5 mg total) by mouth daily.   Omega-3 Fatty Acids (OMEGA-3 FISH OIL PO) Take by mouth.   pantoprazole  (PROTONIX ) 40 MG tablet TAKE 1 TABLET BY MOUTH 1 TO 2  TIMES DAILY BEFORE MEALS   rOPINIRole (REQUIP) 0.5 MG tablet Take 1 mg by mouth at bedtime.   rosuvastatin  (CRESTOR ) 10 MG tablet Take 1 tablet (10 mg total) by mouth daily.   simethicone  (MYLICON) 125 MG chewable tablet Chew 125 mg by mouth every 6 (six) hours as needed for flatulence.   tirzepatide  (MOUNJARO ) 7.5 MG/0.5ML Pen Inject 7.5 mg into the skin once a week.   tiZANidine (ZANAFLEX) 2 MG tablet Take 2 mg by mouth daily as needed.   TRESIBA  FLEXTOUCH 100 UNIT/ML FlexTouch Pen INJECT 55 UNITS SUBCUTANEOUSLY AT BEDTIME (Patient taking differently: 55 Units at bedtime.)   VITAMIN D -VITAMIN K PO Take by mouth.   zolpidem (AMBIEN) 10 MG tablet Take 10 mg by mouth at bedtime.   [DISCONTINUED] glipiZIDE  (GLUCOTROL  XL) 5 MG 24 hr tablet TAKE 1 TABLET BY MOUTH DAILY  WITH BREAKFAST   [DISCONTINUED] Semaglutide, 1 MG/DOSE, (OZEMPIC, 1 MG/DOSE,) 2 MG/1.5ML SOPN Inject 2 mg into the skin once a week.   glipiZIDE  (GLUCOTROL  XL) 5 MG 24 hr tablet Take 1 tablet (5 mg total) by mouth daily with breakfast.   No facility-administered encounter medications on file  as of 09/29/2024.    ALLERGIES: No Known Allergies  VACCINATION STATUS:  There is no immunization history on file for this patient.  Diabetes She presents for her follow-up diabetic visit. She has type 2 diabetes mellitus. Onset time: She was diagnosed at approximate age of 52 years. Her disease course has been improving. There are no hypoglycemic associated symptoms. Pertinent negatives for hypoglycemia include no confusion, pallor or seizures. Associated symptoms include fatigue. Pertinent negatives for diabetes include no polydipsia, no polyphagia, no polyuria and no weight loss. There are no hypoglycemic complications. Symptoms are stable. There are no diabetic complications. Risk factors for coronary artery disease include diabetes mellitus, obesity, sedentary lifestyle, post-menopausal, tobacco exposure, dyslipidemia and hypertension. Current diabetic treatment includes insulin  injections and oral agent (monotherapy) (and Ozempic). She is compliant with treatment most of the time. Her weight is increasing steadily. She is following a generally healthy diet. When asked about meal planning, she reported none. She has not had a previous visit with a dietitian. She rarely participates in exercise. Her home blood glucose trend is decreasing steadily. Her overall blood glucose range is 140-180 mg/dl. (She presents today with her CGM showing improving, mostly at goal glycemic profile overall.  Her most recent A1c on 1/5 was 7.6%, improving from last visit of 7.8%.  Analysis of her CGM shows TIR 77%, TAR 23%, TBR 0% with a GMI of 7%.  She denies any hypoglycemia.  ) An ACE inhibitor/angiotensin II receptor blocker is not being taken. She does not see a podiatrist.Eye exam is current.     Review of systems  Constitutional: + decreasing body weight,  current Body mass index is 39.69 kg/m. , no fatigue,  no subjective hyperthermia, no subjective hypothermia Eyes: no blurry vision, no xerophthalmia ENT:  no sore throat, no nodules palpated in throat, no dysphagia/odynophagia, no hoarseness Cardiovascular: no chest pain, no shortness of breath, no palpitations, no leg swelling Respiratory: no cough, no shortness of breath Gastrointestinal: no nausea/vomiting/diarrhea Musculoskeletal: + chronic diffuse muscle/joint aches (now seeing pain management specialist) Skin: no rashes, no hyperemia Neurological: no tremors, no numbness, no tingling, no dizziness Psychiatric: no depression, no anxiety    Objective:    BP 108/74 (BP Location: Left Arm, Patient Position: Sitting)   Pulse 70   Ht 5' 2 (1.575 m)   Wt 217 lb (98.4 kg)   BMI 39.69 kg/m   Wt Readings from Last 3 Encounters:  09/29/24 217 lb (98.4 kg)  09/27/24 216 lb 9.6 oz (98.2 kg)  07/27/24 219 lb 3.2 oz (99.4 kg)    BP Readings from Last 3 Encounters:  09/29/24 108/74  09/27/24 106/78  07/27/24 104/76     Physical Exam- Limited  Constitutional:  Body mass index is 39.69 kg/m. , not in acute distress, normal state of mind Eyes:  EOMI, no exophthalmos Musculoskeletal: no gross deformities, strength intact in all four extremities, no gross restriction of joint movements Skin:  no rashes, no hyperemia, left lower extremity edema and slight redness Neurological: no tremor with outstretched hands   Diabetic Foot Exam - Simple   No data filed    CMP ( most recent) CMP     Component Value Date/Time   NA 135 05/27/2021 1345   K 4.6 05/27/2021 1345   CL 98 05/27/2021 1345   CO2 22 05/27/2021 1345   GLUCOSE 144 (H) 05/27/2021 1345   GLUCOSE 185 (H) 07/27/2017 0916   BUN 15 05/27/2021 1345   CREATININE 0.89 05/27/2021 1345   CALCIUM  9.2 05/27/2021 1345   PROT 7.2 05/27/2021 1345   ALBUMIN 4.3 05/27/2021 1345   AST 18 05/27/2021 1345   ALT 9 05/27/2021 1345   ALKPHOS 81 05/27/2021 1345   BILITOT 0.6 05/27/2021 1345   GFRNONAA 75 09/25/2020 0000   GFRAA 87 09/25/2020 0000    Lipid Panel     Component Value  Date/Time   CHOL 110 09/25/2020 0000   CHOL 104 05/16/2020 1002   TRIG 101 10/27/2021 0000   HDL 47 09/25/2020 0000   HDL 47 05/16/2020 1002   LDLCALC 17 10/27/2021 0000   LDLCALC 37 05/16/2020 1002   LABVLDL 20 05/16/2020 1002      Assessment & Plan:   1) Controlled type 2 diabetes mellitus without complication  - KAMPBELL HOLAWAY has currently uncontrolled symptomatic type 2 DM since 64 years of age.  She presents today with her CGM showing improving, mostly at goal glycemic profile overall.  Her most recent A1c on 1/5 was 7.6%, improving from last visit of 7.8%.  Analysis of her CGM shows TIR 77%, TAR 23%, TBR 0% with a GMI of 7%.  She denies any hypoglycemia.    -her diabetes is complicated by obesity/sedentary life and she remains at a high risk for more acute and chronic complications which include CAD, CVA, CKD, retinopathy, and neuropathy. These are all discussed in detail with her.  - Nutritional counseling repeated/built upon at each appointment.  - The patient admits there is a room for improvement in their diet and drink choices. -  Suggestion is made for the patient to avoid simple carbohydrates from their diet including Cakes, Sweet Desserts / Pastries, Ice Cream, Soda (diet  and regular), Sweet Tea, Candies, Chips, Cookies, Sweet Pastries, Store Bought Juices, Alcohol in Excess of 1-2 drinks a day, Artificial Sweeteners, Coffee Creamer, and Sugar-free Products. This will help patient to have stable blood glucose profile and potentially avoid unintended weight gain.   - I encouraged the patient to switch to unprocessed or minimally processed complex starch and increased protein intake (animal or plant source), fruits, and vegetables.   - Patient is advised to stick to a routine mealtimes to eat 3 meals a day and avoid unnecessary snacks (to snack only to correct hypoglycemia).  - I have approached her with the following individualized plan to manage diabetes and patient  agrees:   -Based on her presenting glycemic profile, she will not need prandial insulin  for now.    - She is advised to continue Tresiba  55 units SQ nightly, finish her supply of Ozempic 2 mg SQ weekly, and Glipizide  5 mg XL daily at breakfast.  We did try stopping Glipizide  in the past and her glucose rebounded.  Will change to Mounjaro  7.5 mg SQ weekly since she is unable to get her Ozempic from patient assistance this year.  She does still get her Tresiba  from [facility] for now.  -She did not tolerate Metformin  in the past, even in the ER form.    -She is encouraged to monitor glucose twice daily (using her CGM), before breakfast and before bed and call the clinic if she has readings less than 70 or greater than 200 for 3 tests in a row.  She is benefiting from her CGM, is advised to continue using it.    - Patient specific target  A1c;  LDL, HDL, Triglycerides,  were discussed in detail.  2) Blood Pressure /Hypertension: Her blood pressure is controlled to target.  She is advised to continue meds as prescribed by PCP.  3) Lipids/Hyperlipidemia:   Her most recent lipid panel from 05/17/24 shows controlled LDL of 36.  She is advised to continue Crestor  10 mg po daily before bed.  Side effects and precautions discussed with her.   4)  Weight/Diet:  Her Body mass index is 39.69 kg/m.-   clearly complicating her diabetes care.  She is a candidate for modest weight loss.  I discussed with her the fact that loss of 5 - 10% of her  current body weight will have the most impact on her diabetes management.  CDE Consult will be initiated . Exercise, and detailed carbohydrates information provided  -  detailed on discharge instructions.  5) Chronic Care/Health Maintenance: -she on ARB and Statin medication and is encouraged to initiate and continue to follow up with Ophthalmology, Dentist, Podiatrist at least yearly or according to recommendations, and advised to stay away from smoking. I have recommended  yearly flu vaccine and pneumonia vaccine at least every 5 years; moderate intensity exercise for up to 150 minutes weekly; and  sleep for at least 7 hours a day.  - she is advised to maintain close follow up with Shona Norleen PEDLAR, MD for primary care needs, as well as her other providers for optimal and coordinated care.  She is interested in re-establishing care with podiatry.  She notes she will call and schedule appt.     I spent  28  minutes in the care of the patient today including review of labs from CMP, Lipids, Thyroid  Function, Hematology (current and previous including abstractions from other facilities); face-to-face time discussing  her blood glucose readings/logs, discussing hypoglycemia and hyperglycemia  episodes and symptoms, medications doses, her options of short and long term treatment based on the latest standards of care / guidelines;  discussion about incorporating lifestyle medicine;  and documenting the encounter. Risk reduction counseling performed per USPSTF guidelines to reduce obesity and cardiovascular risk factors.     Please refer to Patient Instructions for Blood Glucose Monitoring and Insulin /Medications Dosing Guide  in media tab for additional information. Please  also refer to  Patient Self Inventory in the Media  tab for reviewed elements of pertinent patient history.  Katheryn LELON Hong participated in the discussions, expressed understanding, and voiced agreement with the above plans.  All questions were answered to her satisfaction. she is encouraged to contact clinic should she have any questions or concerns prior to her return visit.    Follow up plan: - Return in about 4 months (around 01/27/2025) for Diabetes F/U with A1c in office, No previsit labs, Bring meter and logs.   Benton Rio, [facility] [address] [place], [place] [address] Phone: [phone] Fax: [phone]  09/29/2024, 1:49 PM    "

## 2025-01-30 ENCOUNTER — Ambulatory Visit: Admitting: Nurse Practitioner
